# Patient Record
Sex: Male | Born: 1935 | Race: White | Smoking: Never smoker
Health system: Northeastern US, Academic
[De-identification: ages and names within clinical notes are randomized; demographics above are authoritative.]

## PROBLEM LIST (undated history)

## (undated) DIAGNOSIS — I447 Left bundle-branch block, unspecified: Secondary | ICD-10-CM

## (undated) DIAGNOSIS — I2510 Atherosclerotic heart disease of native coronary artery without angina pectoris: Secondary | ICD-10-CM

## (undated) DIAGNOSIS — R339 Retention of urine, unspecified: Secondary | ICD-10-CM

## (undated) DIAGNOSIS — I4891 Unspecified atrial fibrillation: Secondary | ICD-10-CM

## (undated) DIAGNOSIS — I4892 Unspecified atrial flutter: Secondary | ICD-10-CM

## (undated) DIAGNOSIS — I10 Essential (primary) hypertension: Secondary | ICD-10-CM

## (undated) DIAGNOSIS — M1990 Unspecified osteoarthritis, unspecified site: Secondary | ICD-10-CM

## (undated) DIAGNOSIS — E785 Hyperlipidemia, unspecified: Secondary | ICD-10-CM

## (undated) DIAGNOSIS — I509 Heart failure, unspecified: Secondary | ICD-10-CM

## (undated) DIAGNOSIS — R001 Bradycardia, unspecified: Secondary | ICD-10-CM

## (undated) HISTORY — DX: Unspecified atrial fibrillation: I48.91

## (undated) HISTORY — DX: Heart failure, unspecified: I50.9

## (undated) HISTORY — PX: ORTHOPEDIC SURGERY: SHX850

## (undated) HISTORY — DX: Left bundle-branch block, unspecified: I44.7

## (undated) HISTORY — DX: Hyperlipidemia, unspecified: E78.5

## (undated) HISTORY — PX: APPENDECTOMY: SHX54

## (undated) HISTORY — DX: Unspecified atrial flutter: I48.92

## (undated) HISTORY — DX: Retention of urine, unspecified: R33.9

## (undated) HISTORY — DX: Essential (primary) hypertension: I10

## (undated) HISTORY — PX: CATARACT REMOVAL WITH IMPLANT: SHX586

## (undated) HISTORY — DX: Unspecified osteoarthritis, unspecified site: M19.90

## (undated) HISTORY — DX: Atherosclerotic heart disease of native coronary artery without angina pectoris: I25.10

## (undated) HISTORY — PX: CORONARY ARTERY BYPASS GRAFT: SHX141

---

## 1999-11-15 ENCOUNTER — Encounter: Payer: Self-pay | Admitting: Emergency Medicine

## 1999-11-15 ENCOUNTER — Inpatient Hospital Stay (HOSPITAL_COMMUNITY): Admission: EM | Admit: 1999-11-15 | Discharge: 1999-11-24 | Payer: Self-pay | Admitting: Emergency Medicine

## 1999-11-15 ENCOUNTER — Encounter: Payer: Self-pay | Admitting: Orthopedic Surgery

## 1999-11-16 ENCOUNTER — Encounter: Payer: Self-pay | Admitting: Orthopedic Surgery

## 1999-11-18 ENCOUNTER — Encounter: Payer: Self-pay | Admitting: Orthopedic Surgery

## 1999-11-19 ENCOUNTER — Encounter: Payer: Self-pay | Admitting: Orthopedic Surgery

## 1999-11-20 ENCOUNTER — Encounter: Payer: Self-pay | Admitting: Orthopedic Surgery

## 1999-11-21 ENCOUNTER — Encounter: Payer: Self-pay | Admitting: Orthopedic Surgery

## 1999-11-22 ENCOUNTER — Encounter: Payer: Self-pay | Admitting: Orthopedic Surgery

## 1999-11-24 ENCOUNTER — Inpatient Hospital Stay (HOSPITAL_COMMUNITY)
Admission: EM | Admit: 1999-11-24 | Discharge: 1999-12-04 | Payer: Self-pay | Admitting: Physical Medicine & Rehabilitation

## 2003-02-09 ENCOUNTER — Inpatient Hospital Stay (HOSPITAL_COMMUNITY): Admission: AD | Admit: 2003-02-09 | Discharge: 2003-02-20 | Payer: Self-pay | Admitting: Emergency Medicine

## 2003-02-13 HISTORY — PX: CORONARY ARTERY BYPASS GRAFT: SHX141

## 2003-03-11 ENCOUNTER — Encounter: Admission: RE | Admit: 2003-03-11 | Discharge: 2003-03-11 | Payer: Self-pay | Admitting: Cardiothoracic Surgery

## 2003-04-11 ENCOUNTER — Encounter (HOSPITAL_COMMUNITY): Admission: RE | Admit: 2003-04-11 | Discharge: 2003-07-10 | Payer: Self-pay | Admitting: Cardiology

## 2003-07-11 ENCOUNTER — Encounter (HOSPITAL_COMMUNITY): Admission: RE | Admit: 2003-07-11 | Discharge: 2003-10-09 | Payer: Self-pay | Admitting: Cardiology

## 2004-06-29 IMAGING — CR DG CHEST 2V
2 series · 2 of 2 positions shown · non-contrast
Comparison: none

CLINICAL DATA: Coronary artery disease, CABG three weeks ago; follow up.
 CHEST X-RAY
 Two views of the chest are compared to a CT of the chest from [HOSPITAL] dated 02/11/03.  There is a small left pleural effusion and left basilar atelectasis status post recent CABG.  A nodular opacity overlying the left anterior first costochondral junction is degenerative in origin when compared to that prior CT.  Median sternotomy sutures are noted and cardiomegaly is unchanged.
 IMPRESSION
 Small left effusion and left basilar atelectasis status post recent CABG.

[view not recorded (1 of 2)]
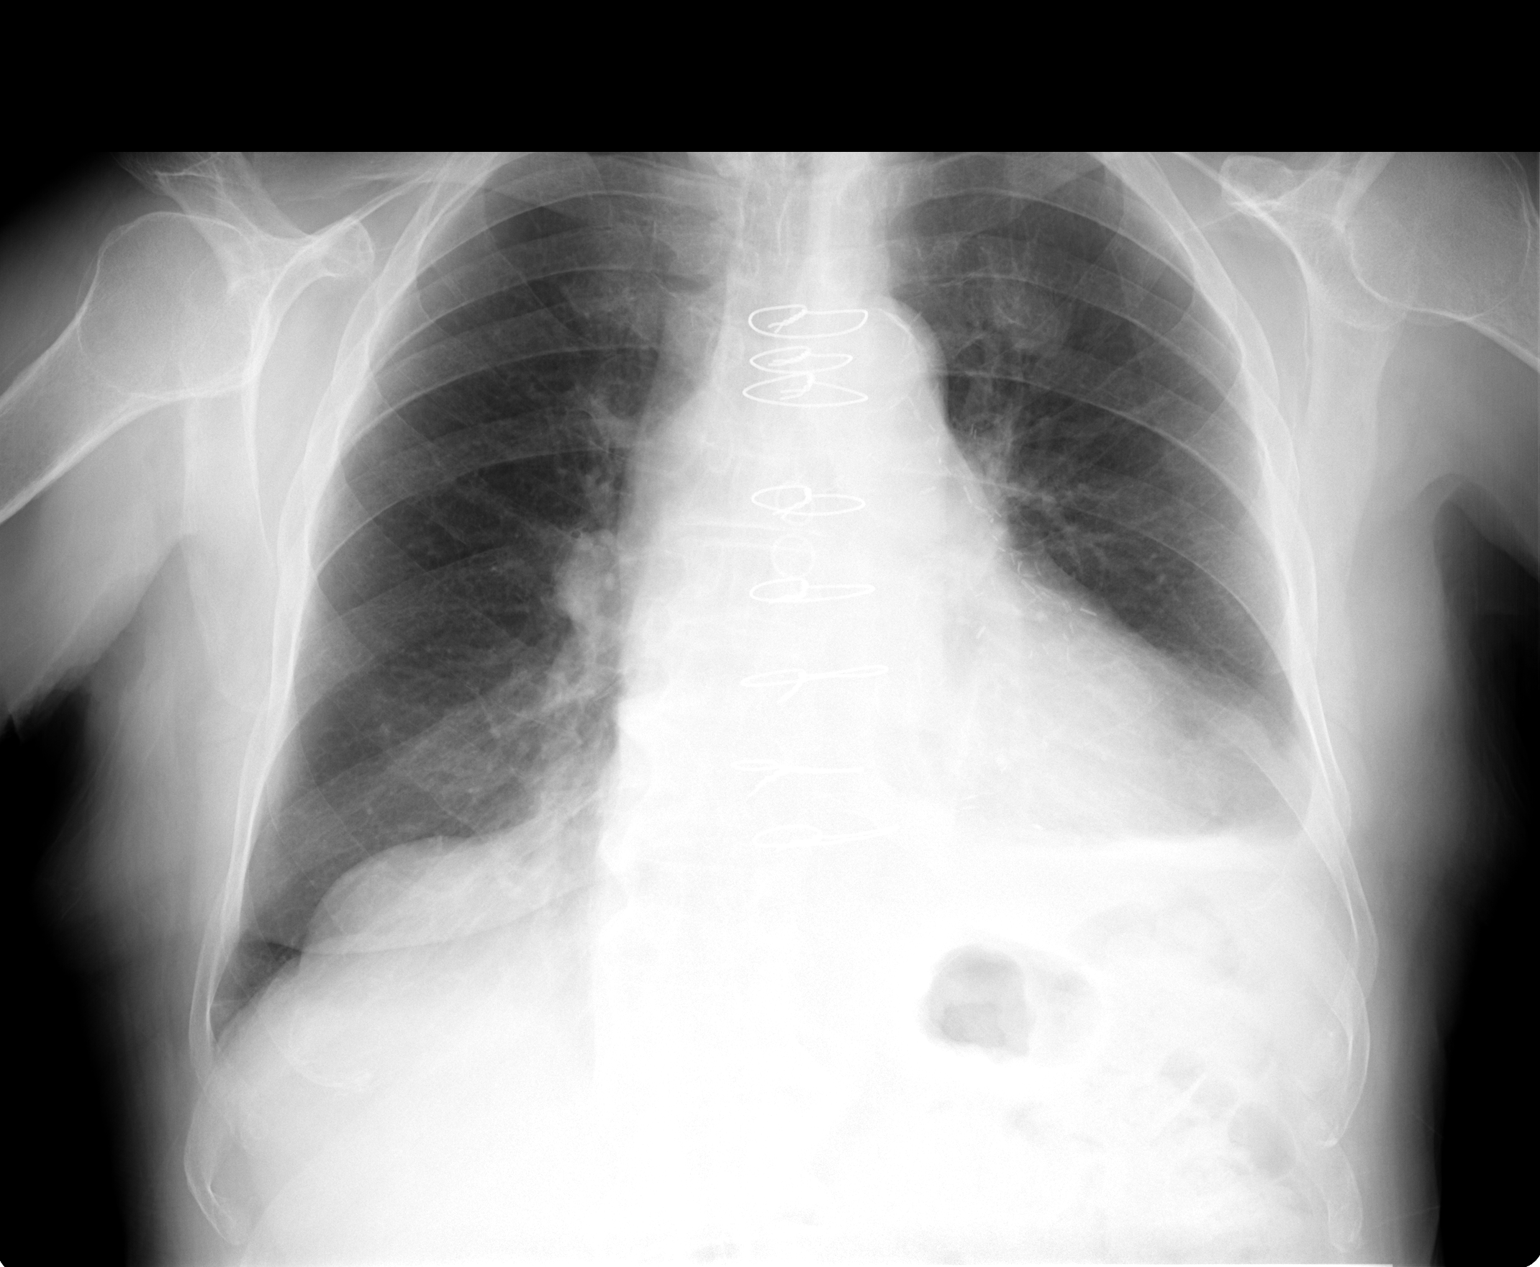

[view not recorded (2 of 2)]
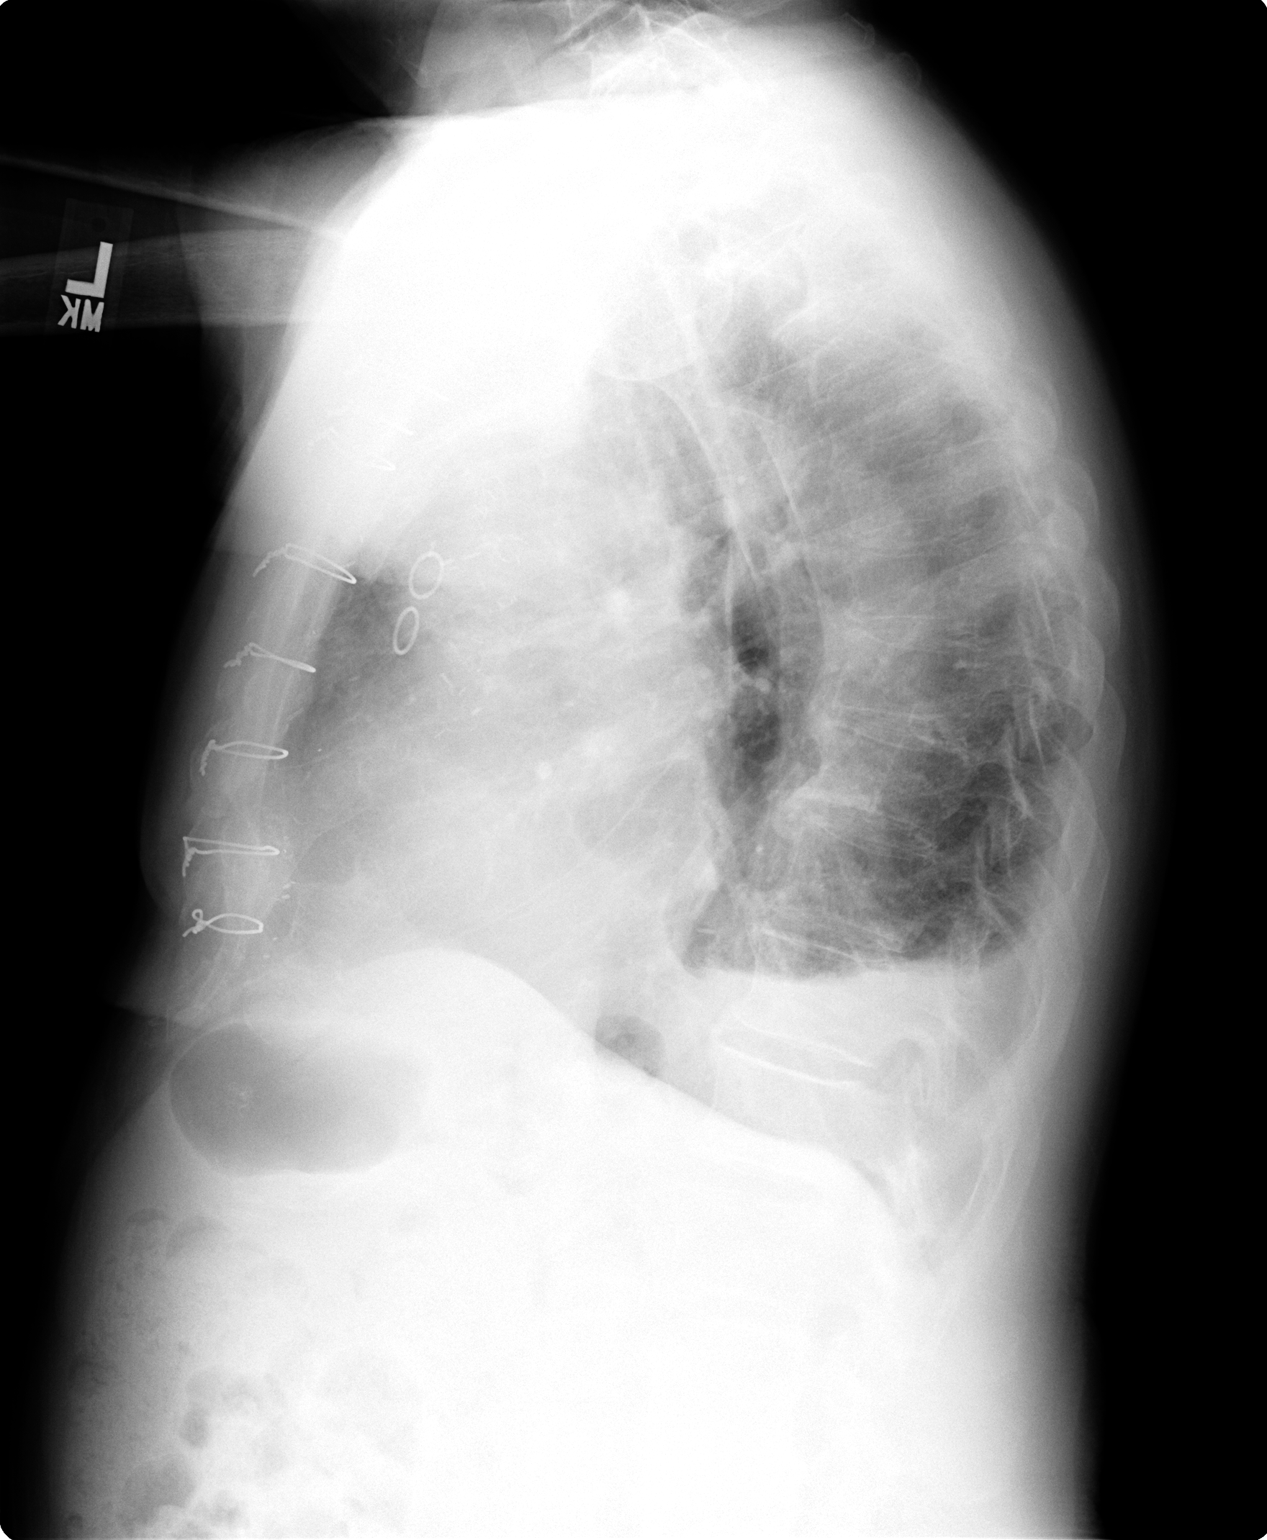

[2 of 2 positions shown; findings below may reference images not displayed]

## 2004-11-22 ENCOUNTER — Inpatient Hospital Stay (HOSPITAL_COMMUNITY): Admission: EM | Admit: 2004-11-22 | Discharge: 2004-12-01 | Payer: Self-pay | Admitting: Emergency Medicine

## 2004-11-22 ENCOUNTER — Ambulatory Visit: Payer: Self-pay | Admitting: Physical Medicine & Rehabilitation

## 2004-12-01 ENCOUNTER — Inpatient Hospital Stay
Admission: RE | Admit: 2004-12-01 | Discharge: 2004-12-08 | Payer: Self-pay | Admitting: Physical Medicine & Rehabilitation

## 2005-03-05 ENCOUNTER — Inpatient Hospital Stay (HOSPITAL_COMMUNITY): Admission: RE | Admit: 2005-03-05 | Discharge: 2005-03-08 | Payer: Self-pay | Admitting: Orthopedic Surgery

## 2011-05-14 DIAGNOSIS — I10 Essential (primary) hypertension: Secondary | ICD-10-CM | POA: Diagnosis not present

## 2011-05-14 DIAGNOSIS — E7800 Pure hypercholesterolemia, unspecified: Secondary | ICD-10-CM | POA: Diagnosis not present

## 2011-05-27 DIAGNOSIS — I4891 Unspecified atrial fibrillation: Secondary | ICD-10-CM | POA: Diagnosis not present

## 2011-05-27 DIAGNOSIS — I2510 Atherosclerotic heart disease of native coronary artery without angina pectoris: Secondary | ICD-10-CM | POA: Diagnosis not present

## 2011-05-27 DIAGNOSIS — E785 Hyperlipidemia, unspecified: Secondary | ICD-10-CM | POA: Diagnosis not present

## 2011-05-27 DIAGNOSIS — I10 Essential (primary) hypertension: Secondary | ICD-10-CM | POA: Diagnosis not present

## 2011-05-27 DIAGNOSIS — I252 Old myocardial infarction: Secondary | ICD-10-CM | POA: Diagnosis not present

## 2011-09-18 DIAGNOSIS — I252 Old myocardial infarction: Secondary | ICD-10-CM | POA: Diagnosis not present

## 2011-09-18 DIAGNOSIS — I10 Essential (primary) hypertension: Secondary | ICD-10-CM | POA: Diagnosis not present

## 2011-09-18 DIAGNOSIS — R61 Generalized hyperhidrosis: Secondary | ICD-10-CM | POA: Diagnosis not present

## 2011-09-18 DIAGNOSIS — E785 Hyperlipidemia, unspecified: Secondary | ICD-10-CM | POA: Diagnosis not present

## 2011-09-18 DIAGNOSIS — I2510 Atherosclerotic heart disease of native coronary artery without angina pectoris: Secondary | ICD-10-CM | POA: Diagnosis not present

## 2011-09-18 DIAGNOSIS — I4891 Unspecified atrial fibrillation: Secondary | ICD-10-CM | POA: Diagnosis not present

## 2011-11-12 DIAGNOSIS — I10 Essential (primary) hypertension: Secondary | ICD-10-CM | POA: Diagnosis not present

## 2011-11-12 DIAGNOSIS — Z23 Encounter for immunization: Secondary | ICD-10-CM | POA: Diagnosis not present

## 2011-11-12 DIAGNOSIS — M129 Arthropathy, unspecified: Secondary | ICD-10-CM | POA: Diagnosis not present

## 2011-11-12 DIAGNOSIS — E7800 Pure hypercholesterolemia, unspecified: Secondary | ICD-10-CM | POA: Diagnosis not present

## 2011-12-02 DIAGNOSIS — I4891 Unspecified atrial fibrillation: Secondary | ICD-10-CM | POA: Diagnosis not present

## 2011-12-02 DIAGNOSIS — E785 Hyperlipidemia, unspecified: Secondary | ICD-10-CM | POA: Diagnosis not present

## 2011-12-02 DIAGNOSIS — I252 Old myocardial infarction: Secondary | ICD-10-CM | POA: Diagnosis not present

## 2011-12-02 DIAGNOSIS — R61 Generalized hyperhidrosis: Secondary | ICD-10-CM | POA: Diagnosis not present

## 2011-12-02 DIAGNOSIS — I10 Essential (primary) hypertension: Secondary | ICD-10-CM | POA: Diagnosis not present

## 2011-12-02 DIAGNOSIS — I2510 Atherosclerotic heart disease of native coronary artery without angina pectoris: Secondary | ICD-10-CM | POA: Diagnosis not present

## 2012-03-16 DIAGNOSIS — M19049 Primary osteoarthritis, unspecified hand: Secondary | ICD-10-CM | POA: Diagnosis not present

## 2012-05-19 DIAGNOSIS — I10 Essential (primary) hypertension: Secondary | ICD-10-CM | POA: Diagnosis not present

## 2012-05-19 DIAGNOSIS — Z0000 Encounter for general adult medical examination without abnormal findings: Secondary | ICD-10-CM | POA: Diagnosis not present

## 2012-05-19 DIAGNOSIS — H6190 Disorder of external ear, unspecified, unspecified ear: Secondary | ICD-10-CM | POA: Diagnosis not present

## 2012-05-19 DIAGNOSIS — Z23 Encounter for immunization: Secondary | ICD-10-CM | POA: Diagnosis not present

## 2012-05-19 DIAGNOSIS — Z1331 Encounter for screening for depression: Secondary | ICD-10-CM | POA: Diagnosis not present

## 2012-05-19 DIAGNOSIS — E7800 Pure hypercholesterolemia, unspecified: Secondary | ICD-10-CM | POA: Diagnosis not present

## 2012-06-01 DIAGNOSIS — I252 Old myocardial infarction: Secondary | ICD-10-CM | POA: Diagnosis not present

## 2012-06-01 DIAGNOSIS — I10 Essential (primary) hypertension: Secondary | ICD-10-CM | POA: Diagnosis not present

## 2012-06-01 DIAGNOSIS — I4891 Unspecified atrial fibrillation: Secondary | ICD-10-CM | POA: Diagnosis not present

## 2012-06-01 DIAGNOSIS — E785 Hyperlipidemia, unspecified: Secondary | ICD-10-CM | POA: Diagnosis not present

## 2012-06-01 DIAGNOSIS — I2510 Atherosclerotic heart disease of native coronary artery without angina pectoris: Secondary | ICD-10-CM | POA: Diagnosis not present

## 2012-06-02 DIAGNOSIS — H3530 Unspecified macular degeneration: Secondary | ICD-10-CM | POA: Diagnosis not present

## 2012-08-31 DIAGNOSIS — H00019 Hordeolum externum unspecified eye, unspecified eyelid: Secondary | ICD-10-CM | POA: Diagnosis not present

## 2012-11-30 DIAGNOSIS — L989 Disorder of the skin and subcutaneous tissue, unspecified: Secondary | ICD-10-CM | POA: Diagnosis not present

## 2012-11-30 DIAGNOSIS — I259 Chronic ischemic heart disease, unspecified: Secondary | ICD-10-CM | POA: Diagnosis not present

## 2012-11-30 DIAGNOSIS — I252 Old myocardial infarction: Secondary | ICD-10-CM | POA: Diagnosis not present

## 2012-11-30 DIAGNOSIS — I10 Essential (primary) hypertension: Secondary | ICD-10-CM | POA: Diagnosis not present

## 2012-11-30 DIAGNOSIS — I2510 Atherosclerotic heart disease of native coronary artery without angina pectoris: Secondary | ICD-10-CM | POA: Diagnosis not present

## 2012-11-30 DIAGNOSIS — E785 Hyperlipidemia, unspecified: Secondary | ICD-10-CM | POA: Diagnosis not present

## 2012-11-30 DIAGNOSIS — I4891 Unspecified atrial fibrillation: Secondary | ICD-10-CM | POA: Diagnosis not present

## 2012-11-30 DIAGNOSIS — M129 Arthropathy, unspecified: Secondary | ICD-10-CM | POA: Diagnosis not present

## 2012-11-30 DIAGNOSIS — H3530 Unspecified macular degeneration: Secondary | ICD-10-CM | POA: Diagnosis not present

## 2012-11-30 DIAGNOSIS — E7800 Pure hypercholesterolemia, unspecified: Secondary | ICD-10-CM | POA: Diagnosis not present

## 2012-11-30 DIAGNOSIS — R609 Edema, unspecified: Secondary | ICD-10-CM | POA: Diagnosis not present

## 2012-12-15 DIAGNOSIS — Z23 Encounter for immunization: Secondary | ICD-10-CM | POA: Diagnosis not present

## 2013-02-23 ENCOUNTER — Ambulatory Visit
Admission: RE | Admit: 2013-02-23 | Discharge: 2013-02-23 | Disposition: A | Discharge status: Home / Self Care | Payer: Medicare Other | Source: Ambulatory Visit | Attending: Cardiology | Admitting: Cardiology

## 2013-02-23 ENCOUNTER — Other Ambulatory Visit: Payer: Self-pay | Admitting: Cardiology

## 2013-02-23 DIAGNOSIS — I4891 Unspecified atrial fibrillation: Secondary | ICD-10-CM | POA: Diagnosis not present

## 2013-02-23 DIAGNOSIS — R609 Edema, unspecified: Secondary | ICD-10-CM | POA: Diagnosis not present

## 2013-02-23 DIAGNOSIS — R0602 Shortness of breath: Secondary | ICD-10-CM

## 2013-02-23 DIAGNOSIS — I252 Old myocardial infarction: Secondary | ICD-10-CM | POA: Diagnosis not present

## 2013-02-23 DIAGNOSIS — I10 Essential (primary) hypertension: Secondary | ICD-10-CM | POA: Diagnosis not present

## 2013-02-23 DIAGNOSIS — E785 Hyperlipidemia, unspecified: Secondary | ICD-10-CM | POA: Diagnosis not present

## 2013-02-23 DIAGNOSIS — I2510 Atherosclerotic heart disease of native coronary artery without angina pectoris: Secondary | ICD-10-CM | POA: Diagnosis not present

## 2013-02-23 DIAGNOSIS — J449 Chronic obstructive pulmonary disease, unspecified: Secondary | ICD-10-CM | POA: Diagnosis not present

## 2013-03-10 ENCOUNTER — Other Ambulatory Visit: Payer: Self-pay | Admitting: Gastroenterology

## 2013-03-10 DIAGNOSIS — I10 Essential (primary) hypertension: Secondary | ICD-10-CM | POA: Diagnosis not present

## 2013-03-10 DIAGNOSIS — R0602 Shortness of breath: Secondary | ICD-10-CM | POA: Diagnosis not present

## 2013-03-10 DIAGNOSIS — I252 Old myocardial infarction: Secondary | ICD-10-CM | POA: Diagnosis not present

## 2013-03-10 DIAGNOSIS — I4891 Unspecified atrial fibrillation: Secondary | ICD-10-CM | POA: Diagnosis not present

## 2013-03-10 DIAGNOSIS — I2589 Other forms of chronic ischemic heart disease: Secondary | ICD-10-CM | POA: Diagnosis not present

## 2013-03-10 DIAGNOSIS — I447 Left bundle-branch block, unspecified: Secondary | ICD-10-CM | POA: Diagnosis not present

## 2013-03-10 DIAGNOSIS — I5022 Chronic systolic (congestive) heart failure: Secondary | ICD-10-CM | POA: Diagnosis not present

## 2013-03-10 DIAGNOSIS — E785 Hyperlipidemia, unspecified: Secondary | ICD-10-CM | POA: Diagnosis not present

## 2013-06-01 DIAGNOSIS — I2589 Other forms of chronic ischemic heart disease: Secondary | ICD-10-CM | POA: Diagnosis not present

## 2013-06-01 DIAGNOSIS — I447 Left bundle-branch block, unspecified: Secondary | ICD-10-CM | POA: Diagnosis not present

## 2013-06-01 DIAGNOSIS — R0602 Shortness of breath: Secondary | ICD-10-CM | POA: Diagnosis not present

## 2013-06-01 DIAGNOSIS — I10 Essential (primary) hypertension: Secondary | ICD-10-CM | POA: Diagnosis not present

## 2013-06-01 DIAGNOSIS — E785 Hyperlipidemia, unspecified: Secondary | ICD-10-CM | POA: Diagnosis not present

## 2013-06-01 DIAGNOSIS — I252 Old myocardial infarction: Secondary | ICD-10-CM | POA: Diagnosis not present

## 2013-06-01 DIAGNOSIS — I5022 Chronic systolic (congestive) heart failure: Secondary | ICD-10-CM | POA: Diagnosis not present

## 2013-06-01 DIAGNOSIS — I4891 Unspecified atrial fibrillation: Secondary | ICD-10-CM | POA: Diagnosis not present

## 2013-06-08 DIAGNOSIS — Z23 Encounter for immunization: Secondary | ICD-10-CM | POA: Diagnosis not present

## 2013-06-08 DIAGNOSIS — Z0000 Encounter for general adult medical examination without abnormal findings: Secondary | ICD-10-CM | POA: Diagnosis not present

## 2013-06-08 DIAGNOSIS — E7800 Pure hypercholesterolemia, unspecified: Secondary | ICD-10-CM | POA: Diagnosis not present

## 2013-06-08 DIAGNOSIS — Z1331 Encounter for screening for depression: Secondary | ICD-10-CM | POA: Diagnosis not present

## 2013-06-08 DIAGNOSIS — I10 Essential (primary) hypertension: Secondary | ICD-10-CM | POA: Diagnosis not present

## 2013-06-21 ENCOUNTER — Other Ambulatory Visit: Payer: Self-pay | Admitting: Family Medicine

## 2013-06-21 ENCOUNTER — Ambulatory Visit
Admission: RE | Admit: 2013-06-21 | Discharge: 2013-06-21 | Disposition: A | Discharge status: Home / Self Care | Payer: Medicare Other | Source: Ambulatory Visit | Attending: Family Medicine | Admitting: Family Medicine

## 2013-06-21 DIAGNOSIS — S2249XA Multiple fractures of ribs, unspecified side, initial encounter for closed fracture: Secondary | ICD-10-CM | POA: Diagnosis not present

## 2013-06-21 DIAGNOSIS — R071 Chest pain on breathing: Secondary | ICD-10-CM

## 2013-12-07 DIAGNOSIS — R600 Localized edema: Secondary | ICD-10-CM | POA: Diagnosis not present

## 2013-12-07 DIAGNOSIS — E780 Pure hypercholesterolemia: Secondary | ICD-10-CM | POA: Diagnosis not present

## 2013-12-07 DIAGNOSIS — M129 Arthropathy, unspecified: Secondary | ICD-10-CM | POA: Diagnosis not present

## 2013-12-07 DIAGNOSIS — I2510 Atherosclerotic heart disease of native coronary artery without angina pectoris: Secondary | ICD-10-CM | POA: Diagnosis not present

## 2013-12-07 DIAGNOSIS — I119 Hypertensive heart disease without heart failure: Secondary | ICD-10-CM | POA: Diagnosis not present

## 2013-12-07 DIAGNOSIS — Z23 Encounter for immunization: Secondary | ICD-10-CM | POA: Diagnosis not present

## 2014-02-24 DIAGNOSIS — R109 Unspecified abdominal pain: Secondary | ICD-10-CM | POA: Diagnosis not present

## 2014-04-08 ENCOUNTER — Encounter: Payer: Self-pay | Admitting: Gastroenterology

## 2014-04-08 DIAGNOSIS — I10 Essential (primary) hypertension: Secondary | ICD-10-CM | POA: Diagnosis not present

## 2014-04-08 DIAGNOSIS — I5022 Chronic systolic (congestive) heart failure: Secondary | ICD-10-CM | POA: Diagnosis not present

## 2014-04-08 DIAGNOSIS — I483 Typical atrial flutter: Secondary | ICD-10-CM | POA: Diagnosis not present

## 2014-04-08 DIAGNOSIS — I482 Chronic atrial fibrillation: Secondary | ICD-10-CM | POA: Diagnosis not present

## 2014-04-08 DIAGNOSIS — E785 Hyperlipidemia, unspecified: Secondary | ICD-10-CM | POA: Diagnosis not present

## 2014-04-08 DIAGNOSIS — I255 Ischemic cardiomyopathy: Secondary | ICD-10-CM | POA: Diagnosis not present

## 2014-04-08 DIAGNOSIS — I447 Left bundle-branch block, unspecified: Secondary | ICD-10-CM | POA: Diagnosis not present

## 2014-04-08 DIAGNOSIS — I252 Old myocardial infarction: Secondary | ICD-10-CM | POA: Diagnosis not present

## 2014-07-22 DIAGNOSIS — Z23 Encounter for immunization: Secondary | ICD-10-CM | POA: Diagnosis not present

## 2014-07-22 DIAGNOSIS — E780 Pure hypercholesterolemia: Secondary | ICD-10-CM | POA: Diagnosis not present

## 2014-07-22 DIAGNOSIS — Z0000 Encounter for general adult medical examination without abnormal findings: Secondary | ICD-10-CM | POA: Diagnosis not present

## 2014-07-22 DIAGNOSIS — Z1389 Encounter for screening for other disorder: Secondary | ICD-10-CM | POA: Diagnosis not present

## 2014-07-22 DIAGNOSIS — I119 Hypertensive heart disease without heart failure: Secondary | ICD-10-CM | POA: Diagnosis not present

## 2014-07-22 DIAGNOSIS — M129 Arthropathy, unspecified: Secondary | ICD-10-CM | POA: Diagnosis not present

## 2014-12-02 DIAGNOSIS — Z23 Encounter for immunization: Secondary | ICD-10-CM | POA: Diagnosis not present

## 2014-12-22 DIAGNOSIS — J00 Acute nasopharyngitis [common cold]: Secondary | ICD-10-CM | POA: Diagnosis not present

## 2015-01-06 DIAGNOSIS — J189 Pneumonia, unspecified organism: Secondary | ICD-10-CM | POA: Diagnosis not present

## 2015-01-06 DIAGNOSIS — R918 Other nonspecific abnormal finding of lung field: Secondary | ICD-10-CM | POA: Diagnosis not present

## 2015-01-12 DIAGNOSIS — I25810 Atherosclerosis of coronary artery bypass graft(s) without angina pectoris: Secondary | ICD-10-CM | POA: Diagnosis not present

## 2015-01-12 DIAGNOSIS — I482 Chronic atrial fibrillation: Secondary | ICD-10-CM | POA: Diagnosis not present

## 2015-01-12 DIAGNOSIS — J189 Pneumonia, unspecified organism: Secondary | ICD-10-CM | POA: Diagnosis not present

## 2015-01-16 ENCOUNTER — Other Ambulatory Visit: Payer: Self-pay | Admitting: Gastroenterology

## 2015-01-16 DIAGNOSIS — J439 Emphysema, unspecified: Secondary | ICD-10-CM | POA: Diagnosis not present

## 2015-01-16 DIAGNOSIS — I517 Cardiomegaly: Secondary | ICD-10-CM | POA: Diagnosis not present

## 2015-01-24 ENCOUNTER — Encounter: Payer: Self-pay | Admitting: Cardiology

## 2015-01-24 ENCOUNTER — Other Ambulatory Visit: Payer: Self-pay | Admitting: Gastroenterology

## 2015-01-24 ENCOUNTER — Ambulatory Visit: Payer: Self-pay | Admitting: Cardiology

## 2015-01-24 DIAGNOSIS — I4819 Other persistent atrial fibrillation: Secondary | ICD-10-CM

## 2015-01-24 DIAGNOSIS — I2510 Atherosclerotic heart disease of native coronary artery without angina pectoris: Secondary | ICD-10-CM | POA: Insufficient documentation

## 2015-01-24 DIAGNOSIS — I10 Essential (primary) hypertension: Secondary | ICD-10-CM

## 2015-01-24 DIAGNOSIS — I5022 Chronic systolic (congestive) heart failure: Secondary | ICD-10-CM

## 2015-01-24 DIAGNOSIS — I483 Typical atrial flutter: Secondary | ICD-10-CM

## 2015-01-24 DIAGNOSIS — I4891 Unspecified atrial fibrillation: Secondary | ICD-10-CM | POA: Insufficient documentation

## 2015-01-24 DIAGNOSIS — I447 Left bundle-branch block, unspecified: Secondary | ICD-10-CM

## 2015-01-24 DIAGNOSIS — E785 Hyperlipidemia, unspecified: Secondary | ICD-10-CM | POA: Insufficient documentation

## 2015-01-24 DIAGNOSIS — I4892 Unspecified atrial flutter: Secondary | ICD-10-CM | POA: Insufficient documentation

## 2015-01-24 DIAGNOSIS — M1990 Unspecified osteoarthritis, unspecified site: Secondary | ICD-10-CM | POA: Insufficient documentation

## 2015-01-24 DIAGNOSIS — I509 Heart failure, unspecified: Secondary | ICD-10-CM | POA: Insufficient documentation

## 2015-01-24 DIAGNOSIS — I481 Persistent atrial fibrillation: Secondary | ICD-10-CM | POA: Diagnosis not present
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
## 2015-01-25 ENCOUNTER — Other Ambulatory Visit: Payer: Self-pay | Admitting: Cardiology

## 2015-01-25 DIAGNOSIS — I4891 Unspecified atrial fibrillation: Secondary | ICD-10-CM

## 2015-01-25 LAB — EKG 12-LEAD
QRS: 25 degrees
Rate: 65 {beats}/min
Severity: ABNORMAL
Severity: ABNORMAL
T: 181 degrees

## 2015-01-25 MED ORDER — APIXABAN 2.5 MG PO TABS *I*
2.5000 mg | ORAL_TABLET | Freq: Two times a day (BID) | ORAL | 1 refills | Status: DC
Start: 2015-01-25 — End: 2015-03-02

## 2015-01-25 NOTE — Telephone Encounter (Signed)
Patient would like 90 day refill for Eliquis sent to [facility] on [address].

## 2015-02-12 HISTORY — PX: OTHER SURGICAL HISTORY: SHX169

## 2015-02-25 ENCOUNTER — Inpatient Hospital Stay
Admission: EM | Admit: 2015-02-25 | Disposition: A | Discharge status: Skilled Nursing Facility | Payer: Self-pay | Source: Ambulatory Visit | Attending: Orthopedic Surgery | Admitting: Orthopedic Surgery

## 2015-02-25 ENCOUNTER — Encounter: Payer: Self-pay | Admitting: Emergency Medicine

## 2015-02-25 ENCOUNTER — Other Ambulatory Visit: Payer: Self-pay | Admitting: Gastroenterology

## 2015-02-25 DIAGNOSIS — S7221XA Displaced subtrochanteric fracture of right femur, initial encounter for closed fracture: Principal | ICD-10-CM

## 2015-02-25 DIAGNOSIS — I4891 Unspecified atrial fibrillation: Secondary | ICD-10-CM | POA: Diagnosis present

## 2015-02-25 DIAGNOSIS — I509 Heart failure, unspecified: Secondary | ICD-10-CM | POA: Diagnosis present

## 2015-02-25 DIAGNOSIS — I2510 Atherosclerotic heart disease of native coronary artery without angina pectoris: Secondary | ICD-10-CM | POA: Diagnosis present

## 2015-02-25 DIAGNOSIS — I10 Essential (primary) hypertension: Secondary | ICD-10-CM | POA: Diagnosis present

## 2015-02-25 DIAGNOSIS — S72001A Fracture of unspecified part of neck of right femur, initial encounter for closed fracture: Secondary | ICD-10-CM | POA: Diagnosis not present

## 2015-02-25 DIAGNOSIS — I447 Left bundle-branch block, unspecified: Secondary | ICD-10-CM | POA: Diagnosis not present

## 2015-02-25 DIAGNOSIS — D62 Acute posthemorrhagic anemia: Secondary | ICD-10-CM | POA: Diagnosis not present

## 2015-02-25 DIAGNOSIS — M79651 Pain in right thigh: Secondary | ICD-10-CM | POA: Diagnosis not present

## 2015-02-25 DIAGNOSIS — M79601 Pain in right arm: Secondary | ICD-10-CM | POA: Diagnosis not present

## 2015-02-25 DIAGNOSIS — Z5189 Encounter for other specified aftercare: Secondary | ICD-10-CM | POA: Diagnosis not present

## 2015-02-25 DIAGNOSIS — M160 Bilateral primary osteoarthritis of hip: Secondary | ICD-10-CM | POA: Diagnosis not present

## 2015-02-25 DIAGNOSIS — M25552 Pain in left hip: Secondary | ICD-10-CM | POA: Diagnosis not present

## 2015-02-25 DIAGNOSIS — W19XXXD Unspecified fall, subsequent encounter: Secondary | ICD-10-CM | POA: Diagnosis not present

## 2015-02-25 DIAGNOSIS — S8992XA Unspecified injury of left lower leg, initial encounter: Secondary | ICD-10-CM | POA: Diagnosis not present

## 2015-02-25 DIAGNOSIS — R339 Retention of urine, unspecified: Secondary | ICD-10-CM | POA: Diagnosis not present

## 2015-02-25 DIAGNOSIS — I25810 Atherosclerosis of coronary artery bypass graft(s) without angina pectoris: Secondary | ICD-10-CM | POA: Diagnosis not present

## 2015-02-25 DIAGNOSIS — R2681 Unsteadiness on feet: Secondary | ICD-10-CM | POA: Diagnosis not present

## 2015-02-25 DIAGNOSIS — G8918 Other acute postprocedural pain: Secondary | ICD-10-CM | POA: Diagnosis not present

## 2015-02-25 DIAGNOSIS — Z9181 History of falling: Secondary | ICD-10-CM | POA: Diagnosis not present

## 2015-02-25 DIAGNOSIS — R296 Repeated falls: Secondary | ICD-10-CM | POA: Diagnosis not present

## 2015-02-25 DIAGNOSIS — I482 Chronic atrial fibrillation: Secondary | ICD-10-CM | POA: Diagnosis not present

## 2015-02-25 DIAGNOSIS — Z951 Presence of aortocoronary bypass graft: Secondary | ICD-10-CM | POA: Diagnosis not present

## 2015-02-25 DIAGNOSIS — I5032 Chronic diastolic (congestive) heart failure: Secondary | ICD-10-CM | POA: Diagnosis not present

## 2015-02-25 DIAGNOSIS — S7221XD Displaced subtrochanteric fracture of right femur, subsequent encounter for closed fracture with routine healing: Secondary | ICD-10-CM | POA: Diagnosis not present

## 2015-02-25 DIAGNOSIS — W1831XA Fall on same level due to stepping on an object, initial encounter: Secondary | ICD-10-CM | POA: Diagnosis not present

## 2015-02-25 DIAGNOSIS — M25551 Pain in right hip: Secondary | ICD-10-CM | POA: Diagnosis not present

## 2015-02-25 DIAGNOSIS — S72101A Unspecified trochanteric fracture of right femur, initial encounter for closed fracture: Secondary | ICD-10-CM | POA: Diagnosis not present

## 2015-02-25 DIAGNOSIS — S72141A Displaced intertrochanteric fracture of right femur, initial encounter for closed fracture: Secondary | ICD-10-CM | POA: Diagnosis not present

## 2015-02-25 DIAGNOSIS — R9431 Abnormal electrocardiogram [ECG] [EKG]: Secondary | ICD-10-CM | POA: Diagnosis not present

## 2015-02-25 LAB — CBC AND DIFFERENTIAL
Baso # K/uL: 0.1 10*3/uL (ref 0.0–0.1)
Basophil %: 0.8 %
Eos # K/uL: 0.1 10*3/uL (ref 0.0–0.5)
Eosinophil %: 1.6 %
Hematocrit: 40 % (ref 40–51)
Hemoglobin: 12.7 g/dL — ABNORMAL LOW (ref 13.7–17.5)
IMM Granulocytes #: 0 10*3/uL (ref 0.0–0.1)
IMM Granulocytes: 0.4 %
Lymph # K/uL: 1.6 10*3/uL (ref 1.3–3.6)
Lymphocyte %: 21.1 %
MCH: 31 pg (ref 26–32)
MCHC: 32 g/dL (ref 32–37)
MCV: 97 fL — ABNORMAL HIGH (ref 79–92)
Mono # K/uL: 0.8 10*3/uL (ref 0.3–0.8)
Monocyte %: 9.9 %
Neut # K/uL: 5 10*3/uL (ref 1.8–5.4)
Nucl RBC # K/uL: 0 10*3/uL (ref 0.0–0.0)
Nucl RBC %: 0 /100 WBC (ref 0.0–0.2)
Platelets: 273 10*3/uL (ref 150–330)
RBC: 4.1 MIL/uL — ABNORMAL LOW (ref 4.6–6.1)
RDW: 14.1 % (ref 11.6–14.4)
Seg Neut %: 66.2 %
WBC: 7.6 10*3/uL (ref 4.2–9.1)

## 2015-02-25 LAB — MCHC: MCHC: 32 g/dL (ref 32–37)

## 2015-02-25 LAB — BASIC METABOLIC PANEL
Anion Gap: 13 (ref 7–16)
CO2: 25 mmol/L (ref 20–28)
Calcium: 8.9 mg/dL (ref 8.6–10.2)
Chloride: 103 mmol/L (ref 96–108)
Creatinine: 0.84 mg/dL (ref 0.67–1.17)
GFR,Black: 83 *
GFR,Caucasian: 72 *
Glucose: 110 mg/dL — ABNORMAL HIGH (ref 60–99)
Lab: 18 mg/dL (ref 6–20)
Potassium: 4 mmol/L (ref 3.3–5.1)
Sodium: 141 mmol/L (ref 133–145)

## 2015-02-25 LAB — ALBUMIN: Albumin: 3.8 g/dL (ref 3.5–5.2)

## 2015-02-25 LAB — HOLD BLUE

## 2015-02-25 LAB — PTH, INTACT: Intact PTH: 61 pg/mL (ref 15.0–65.0)

## 2015-02-25 LAB — TYPE AND SCREEN
ABO RH Blood Type: O POS
Antibody Screen: NEGATIVE

## 2015-02-25 LAB — HCT AND HGB
Hematocrit: 34 % — ABNORMAL LOW (ref 40–51)
Hemoglobin: 11 g/dL — ABNORMAL LOW (ref 13.7–17.5)

## 2015-02-25 LAB — PROTIME-INR
INR: 1.4 — ABNORMAL HIGH (ref 0.9–1.1)
Protime: 16.5 s — ABNORMAL HIGH (ref 10.0–12.9)

## 2015-02-25 LAB — HOLD GREEN WITH GEL

## 2015-02-25 LAB — TSH: TSH: 2.26 u[IU]/mL (ref 0.27–4.20)

## 2015-02-25 MED ORDER — ACETAMINOPHEN 500 MG PO TABS *I*
1000.0000 mg | ORAL_TABLET | Freq: Three times a day (TID) | ORAL | Status: DC
Start: 2015-02-25 — End: 2015-02-26
  Administered 2015-02-25 – 2015-02-26 (×2): 1000 mg via ORAL
  Filled 2015-02-25 (×2): qty 2

## 2015-02-25 MED ORDER — OXYCODONE HCL 5 MG PO TABS *I*
5.0000 mg | ORAL_TABLET | ORAL | Status: DC | PRN
Start: 2015-02-25 — End: 2015-02-26

## 2015-02-25 MED ORDER — OXYCODONE HCL 5 MG PO TABS *I*
2.5000 mg | ORAL_TABLET | ORAL | Status: DC | PRN
Start: 2015-02-25 — End: 2015-02-26
  Administered 2015-02-25: 2.5 mg via ORAL
  Filled 2015-02-25: qty 1

## 2015-02-25 MED ORDER — SODIUM CHLORIDE 0.9 % IV SOLN WRAPPED *I*
100.0000 mL/h | Status: DC
Start: 2015-02-26 — End: 2015-02-26
  Administered 2015-02-26: 100 mL/h via INTRAVENOUS

## 2015-02-25 MED ORDER — HALOPERIDOL LACTATE 5 MG/ML IJ SOLN *I*
0.5000 mg | Freq: Four times a day (QID) | INTRAMUSCULAR | Status: DC | PRN
Start: 2015-02-25 — End: 2015-02-26

## 2015-02-25 MED ORDER — ONDANSETRON HCL 2 MG/ML IV SOLN *I*
4.0000 mg | Freq: Once | INTRAMUSCULAR | Status: AC
Start: 2015-02-25 — End: 2015-02-25
  Administered 2015-02-25: 4 mg via INTRAVENOUS
  Filled 2015-02-25: qty 2

## 2015-02-25 MED ORDER — SODIUM CHLORIDE 0.9 % IV BOLUS *I*
500.0000 mL | Freq: Once | Status: AC
Start: 2015-02-25 — End: 2015-02-25
  Administered 2015-02-25: 500 mL via INTRAVENOUS

## 2015-02-25 MED ORDER — METOPROLOL TARTRATE 50 MG PO TABS *I*
50.0000 mg | ORAL_TABLET | Freq: Two times a day (BID) | ORAL | Status: DC
Start: 2015-02-25 — End: 2015-02-26
  Administered 2015-02-25: 50 mg via ORAL
  Filled 2015-02-25: qty 1

## 2015-02-25 MED ORDER — VANCOMYCIN HCL IN DEXTROSE 5 MG/ML IV SOLN *I*
1000.0000 mg | INTRAVENOUS | Status: DC
Start: 2015-02-26 — End: 2015-02-26
  Filled 2015-02-25: qty 200

## 2015-02-25 MED ORDER — CALCIUM CARBONATE ANTACID 500 MG PO CHEW *I*
500.0000 mg | CHEWABLE_TABLET | Freq: Two times a day (BID) | ORAL | Status: DC
Start: 2015-02-25 — End: 2015-02-25

## 2015-02-25 MED ORDER — AMLODIPINE BESYLATE 5 MG PO TABS *I*
5.0000 mg | ORAL_TABLET | Freq: Every day | ORAL | Status: DC
Start: 2015-02-26 — End: 2015-02-25
  Filled 2015-02-25: qty 1

## 2015-02-25 MED ORDER — METOPROLOL SUCCINATE 50 MG PO TB24 *I*
100.0000 mg | ORAL_TABLET | Freq: Every day | ORAL | Status: DC
Start: 2015-02-25 — End: 2015-02-25

## 2015-02-25 MED ORDER — MORPHINE SULFATE 2 MG/ML IV SOLN *WRAPPED*
2.0000 mg | Status: DC | PRN
Start: 2015-02-25 — End: 2015-02-26
  Filled 2015-02-25: qty 1

## 2015-02-25 MED ORDER — ATORVASTATIN CALCIUM 20 MG PO TABS *I*
20.0000 mg | ORAL_TABLET | Freq: Every day | ORAL | Status: DC
Start: 2015-02-25 — End: 2015-03-03
  Administered 2015-02-25 – 2015-03-02 (×6): 20 mg via ORAL
  Filled 2015-02-25 (×7): qty 1

## 2015-02-25 MED ORDER — CALCIUM CARBONATE ANTACID 500 MG PO CHEW *I*
500.0000 mg | CHEWABLE_TABLET | Freq: Two times a day (BID) | ORAL | Status: DC
Start: 2015-02-25 — End: 2015-03-03
  Administered 2015-02-25 – 2015-03-03 (×12): 500 mg via ORAL
  Filled 2015-02-25 (×12): qty 1

## 2015-02-25 MED ORDER — MORPHINE SULFATE 4 MG/ML IV SOLN *WRAPPED*
4.0000 mg | INTRAVENOUS | Status: DC | PRN
Start: 2015-02-25 — End: 2015-02-26

## 2015-02-25 MED ORDER — MORPHINE SULFATE 4 MG/ML IV SOLN *WRAPPED*
4.0000 mg | Freq: Once | INTRAVENOUS | Status: AC
Start: 2015-02-25 — End: 2015-02-25
  Administered 2015-02-25: 4 mg via INTRAVENOUS
  Filled 2015-02-25: qty 1

## 2015-02-25 MED ORDER — ONDANSETRON HCL 2 MG/ML IV SOLN *I*
4.0000 mg | Freq: Four times a day (QID) | INTRAMUSCULAR | Status: DC | PRN
Start: 2015-02-25 — End: 2015-02-26
  Administered 2015-02-25: 4 mg via INTRAVENOUS
  Filled 2015-02-25: qty 2

## 2015-02-25 NOTE — ED Notes (Signed)
[facility]    Chief Complaint:   Chief Complaint   Patient presents with    Fall     Tripped and fell while walking with his cane at home.  Reports R upper leg pain.  Denies LOC.  Pt is on Eloquis.  Lives with family.      Reason for Visit: Fall    Active Issues/Concerns: Fx right femur    To Do List: Pain management, ortho to see    Code Status: Unknown    Last Filed Vitals    02/25/15 1343   BP: 125/65   Pulse:    Resp: 19   Temp: 36.2 C (97.2 F)   SpO2: 97%       Orientation: Alert and Oriented X 3.    Ambulation Aides: a cane    Toileting: bedpan/commode    Fall Risk: Morse Fall Risk Level: High (45 and higher)  Patient is a fall risk (Morse score >24): Yes   Risk factors for falls: yes    Braden Score: Braden Scale Score: 17  Has a speciality mattress been ordered? no    Ready bed confirmed with nursing staff on [address].  Pt to be transported ASAP.  Please call extension [phone] with questions.    Doristine Locksathleen Haizel Gatchell, RN

## 2015-02-25 NOTE — Progress Notes (Addendum)
Pt BP 78/50. Pt asymptomatic with no complaints. Notified Jiles CrockerMeghan Kelly, Ortho resident. Awaiting further orders at this time. Greggory Stallionhristy E Kerston Landeck, RN    Recheck BP 80/50. Notified Jiles CrockerMeghan Kelly, ortho resident. Per Dr. Landry DykeKelly's order, NS 500ml bolus to be given. Nursing will recheck BP one hour after completion of bolus. Greggory Stallionhristy E Wallie Lagrand, RN

## 2015-02-25 NOTE — ED Notes (Signed)
Pt to xray
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
## 2015-02-25 NOTE — Progress Notes (Signed)
Pt admitted to W6 via stretcher. Pt oriented to W6, room and call system. Pt AOx3, VSS, c/o 4/10 right leg pain but pt declining pain medication at this time. Pt currently resting in bed with family at bedside. Greggory Stallionhristy E Sonny Poth, RN

## 2015-02-25 NOTE — ED Provider Progress Notes (Signed)
ED Provider Progress Note    X-ray showing femur fracture.  Orthopedics consult.  Patient will be admitted orthopedics     [facility] Mat[facility] Dimaano, DO, 02/25/2015, 12:47 PM         Arleth Mccullar, Jonny RuizJohn, DO  02/25/15 1247

## 2015-02-25 NOTE — Provider Consult (Signed)
Geriatrics Assessment for [facility] Patient     LOS: 0 days      Consultant: Renelda MomOREY E Kelli Robeck, MD   Orthopaedic Surgeon: Arvil PersonsGiordano, Brian D, MD   Primary Care Provider: Unknown, Provider     Identifying Data: 82 y.o. male patient from house.  [x]  Good historian  []  History limited by:   []  Delirium   []  Dementia   []  Condition  [x]  Additional history obtained from:   [x]  Family   []  PCP   []  Other providers   []  [facility] records   []  Office records   []  SNF/ALF/other hospital/facility records  and summarized with below    Chief Complaint: Right hip fracture, HTN, CAD and Abnormal EKG  Asked to see patient for perioperative medical management by surgeon above.    History of Present Illness:  Mr. Jonathan Cisneros is 82 yo male with history of cad s/p cabg many years ago as well as CHF and afib who [place] He has been doing well, but had pna several weeks ago.  He fell this am at home due to misplacing a cane at a step down and lost balance and fell onto right side.  He lives with granddaughter, who was present at time and able to call for EMIS.  He had immedicate, severe, R hip pain and could not bear weight  He denies recent chest pain or sob.  He has history of cad with cabg, but no recent symptoms. He has diagnosis of CHF, but denies having issues with heart failure.  He says he was diagnosed with afib severl months ago and started on eliquis.  Per dtr, he saw Dr. Andree CossWorner here in [place] and cards eval was ok.  No one is aware of past EF.  He exercizes regularly on statinonary bike.  Does about 30 min/day - no symptoms with activity.     Took his eliquis this am.       Past Medical History   Diagnosis Date    Atrial fibrillation     CHF (congestive heart failure)     Hypertension        History reviewed. No pertinent past surgical history.    Prescriptions Prior to Admission   Medication Sig    amLODIPine-atorvastatatin (CADUET) 5-20 MG  per tablet Take 1 tablet by mouth daily    ELIQUIS 2.5 MG tablet Take 2.5 mg by mouth 2 times daily    benzonatate (TESSALON) 100 MG capsule Take 100 mg by mouth 3 times daily as needed    furosemide (LASIX) 40 MG tablet Take 40 mg by mouth daily    metoprolol (TOPROL-XL) 200 MG 24 hr tablet Take 100 mg by mouth daily    KLOR-CON M20 20 MEQ  tablet Take 20 mEq by mouth daily    valsartan (DIOVAN) 320 MG tablet Take 320 mg by mouth daily       Current Facility-Administered Medications   Medication Dose Route Frequency    atorvastatin (LIPITOR) tablet 20 mg  20 mg Oral Daily    haloperidol lactate (HALDOL) injection 0.5 mg  0.5 mg Intravenous Q6H PRN    acetaminophen (TYLENOL) tablet 1,000 mg  1,000 mg Oral 3 times per day    [START ON 02/26/2015] sodium chloride 0.9 % IV  100 mL/hr Intravenous Continuous    oxyCODONE (ROXICODONE) IR tablet 2.5 mg  2.5 mg Oral Q2H PRN    Or    oxyCODONE (ROXICODONE) IR tablet  5 mg  5 mg Oral Q2H PRN    morphine 2 mg/mL injection 2 mg  2 mg Intravenous Q2H PRN    Or    morphine 4 MG/ML injection 4 mg  4 mg Intravenous Q2H PRN    metoprolol (LOPRESSOR) tablet 50 mg  50 mg Oral 2 times per day    [START ON 02/26/2015] Vancomycin (VANCOCIN) IV 1,000 mg  1,000 mg Intravenous Daily       Allergies   Allergen Reactions    Penicillins Rash       History reviewed. No pertinent family history.    Social History     Social History    Marital status: Widowed     Spouse name: N/A    Number of children: N/A    Years of education: N/A     Social History Main Topics    Smoking status: Never Smoker    Smokeless tobacco: None    Alcohol use Yes    Drug use: None    Sexual activity: Not Asked     Other Topics Concern    None     Social History Narrative    None       Advance Directives:    DNI: yes    DNR: yes   []  Hold DNR/DNI in OR (discussed with patient or surrogate)   []  DO NOT Hold DNR/DNI in OR (discussed with patient or surrogate)   [x]  OR resuscitation status requires  further clarificaton    Health Care Proxy: yes    Living Will: unknown    Health Care Proxy Name/Relationship: daughter  System Code Status: DNR/DNI   [x]  MOLST at home - family bringing in  []  MOLST completed today    Function: Independent - uses cane    ROS   No fevers or chills  Eating well  No abd pain, no diarreha, no constipation  No chest pain or sob or doe  No dysuria  Sleeps well  Otherwise negative     Vitals:  Visit Vitals    BP 129/60 (BP Location: Right arm)    Pulse 92    Temp 36.5 C (97.7 F) (Temporal)    Resp 18    Ht 1.626 m (5\' 4" )    Wt 59 kg (130 lb)    SpO2 96%    BMI 22.31 kg/m2     BP: (123-129)/(60-69)   Temp:  [36.2 C (97.2 F)-36.5 C (97.7 F)]   Temp src: Temporal (01/14 1435)  Heart Rate:  [82-92]   Resp:  [18-19]   SpO2:  [95 %-97 %]   Height:  [162.6 cm (5\' 4" )]   Weight:  [59 kg (130 lb)]      Last Nursing documented pain:  0-10 Scale: 4 (02/25/15 1433)       Physical Examination:  Gen:  NAD  Eyes: anicteric sclera, eomi  ENT: MMM  Neck: no thyromegaly  Chest: clear b/l  CVS: irreg rhythm, normal rate  ABD: soft, NT +bs  GU: no foley  MSK:  RLE ext rotated  Skin:  No rashes  Neuro:no focal motor deficits  Mental status: awake and alert      Lab Results:   All labs in the last 24 hours:   Recent Results (from the past 24 hour(s))   CBC and differential    Collection Time: 02/25/15 11:37 AM   Result Value Ref Range    WBC 7.6 4.2 - 9.1 THOU/uL    RBC  4.1 (L) 4.6 - 6.1 MIL/uL    Hemoglobin 12.7 (L) 13.7 - 17.5 g/dL    Hematocrit 40 40 - 51 %    MCV 97 (H) 79 - 92 fL    MCH 31 26 - 32 pg    MCHC 32 32 - 37 g/dL    RDW 16.1 09.6 - 04.5 %    Platelets 273 150 - 330 THOU/uL    Seg Neut % 66.2 %    Lymphocyte % 21.1 %    Monocyte % 9.9 %    Eosinophil % 1.6 %    Basophil % 0.8 %    Neut # K/uL 5.0 1.8 - 5.4 THOU/uL    Lymph # K/uL 1.6 1.3 - 3.6 THOU/uL    Mono # K/uL 0.8 0.3 - 0.8 THOU/uL    Eos # K/uL 0.1 0.0 - 0.5 THOU/uL    Baso # K/uL 0.1 0.0 - 0.1 THOU/uL    Nucl RBC % 0.0 0.0  - 0.2 /100 WBC    Nucl RBC # K/uL 0.0 0.0 - 0.0 THOU/uL    IMM Granulocytes # 0.0 0.0 - 0.1 THOU/uL    IMM Granulocytes 0.4 %   Basic metabolic panel    Collection Time: 02/25/15 11:37 AM   Result Value Ref Range    Glucose 110 (H) 60 - 99 mg/dL    Sodium 409 811 - 914 mmol/L    Potassium 4.0 3.3 - 5.1 mmol/L    Chloride 103 96 - 108 mmol/L    CO2 25 20 - 28 mmol/L    Anion Gap 13 7 - 16    UN 18 6 - 20 mg/dL    Creatinine 7.82 9.56 - 1.17 mg/dL    GFR,Caucasian 72 *    GFR,Black 83 *    Calcium 8.9 8.6 - 10.2 mg/dL       Imaging findings: * Femur Right Standard Ap And Lateral    Result Date: 02/25/2015  IMPRESSION:  Proximal right femoral fracture  END OF REPORT    * Hip Right Ap And Lateral Views    Result Date: 02/25/2015  IMPRESSION:  Proximal right femoral fracture  END OF REPORT       EKG:    Afib, LBBB     Medical Decision Making:    Active Problem List  Patient Active Problem List   Diagnosis Code    Closed displaced subtrochanteric fracture of right femur, initial encounter S72.21XA    HTN (hypertension) I10    A-fib I48.91    CAD (coronary artery disease) I25.10    CHF (congestive heart failure) I50.9        GFC Perioperative Assessment/Recommendations  Surgical Fitness: No medical contraindications to proposed procedure. Intermediate risk  Bone Density: Probable osteoporosis and Possible osteomalacia  Delirium Risk: not increased  Prognosis: fair   Rehabilitation Potential: good   Delirium and other risks can be reduced by:   Early ambulation  Minimizing "tethers" - IV's, oxygen, catheters, etc  Avoiding hypnotics and sedatives  Maintaining hydration and nutrition  Avoiding anticholergics - diphenhydramine, etc  Pain control  Supportive environment  Consider hold ACE Inhibitors, ARB's, and diuretics for OR  Continue home beta blockers as tolerated  Medically preferred DVT prophylaxis: LMWH - bridge to restarting eliquis  Metabolic bone work up: Calcium, TSH, iPTH and Vit D-25 OH    Geriatric Medicine  Assessment/Plan/Recommendations:  Amarius Toto  is a 82 y.o. male with a history of cad, afib and  chf who presents with R hip fx.  Regarding the cardiopulmonary status, the patient is Moderate Risk for this Moderate Risk surgery.  There is a Moderate Risk risk of post-operative complications, such as acute blood loss anemia given the history of anticoagulation use.  Overall, the benefits of the surgery outweigh the risks, as optimizing function is the goal.  The patient is medically optimized and may proceed to surgery once he has been off eliquis for 24 hours.    Pt has history of cad and chf so has 2 major risk factors and so would be RCRI risk category 3, which is estimated at 6% risk.  Risk of decompensation without surgery would be greater due to loss of function, etc.  In my opinion, benefits of early surgery outweigh risks.     Recommendation by problem:    Anticoagulation -- on eliquis for afib -- took dose this am  --would wait 24 hours prior to surgery  --post op, would use LMWH as dvt prophy until adequate hemostasis allows for resuming full anticoagulation    CAD -- remote cabg.  No recent symptoms.    --continue beta blocker\  --continue statin     afib -- rate controlled.  Did not take his toprol this am  --will continue beta blocker, but change to lopressor 50 bid to allow for more control of dosing peri-op  --holding eliquis as above    HTN -- controlled  --hold valsartan and amlodipne    CHF - chronic.  Unknown EF.  Compensated  --on beta blocker  --holding arb for now  --hold lasix   --ok to hydrate pre-op - will reduce rate to 75 cc/hr as surgery delayed until tomorrow    Discharge Planning: SNF Rehab  I will continue to follow with you to manage medical conditions above.  Advanced Directives: see above    Signed: Renelda Mom, MD  on 02/25/2015  at 3:03 PM  [x]  Total patient care time on the unit was more than 70 minutes and more than 50% was counseling and coordination of care.  Coordination  of Care / Discussed with Ortho HS, Patient and Family    Quality and compliance reminders:  Address advanced directives  Address discharge plan  Document functional status and prognosis  Update problem list for condition present on admission or develop

## 2015-02-25 NOTE — ED Provider Notes (Signed)
History     Chief Complaint   Patient presents with    Fall     Tripped and fell while walking with his cane at home.  Reports R upper leg pain.  Denies LOC.  Pt is on Eloquis.  Lives with family.     HPI Comments: [age] year old male presents the ED with chief complaint of right upper leg pain.  Patient states he was walking this morning when he went to go down a step places cane but missed the step and fell landing on the right leg.  Patient states that afterwards he had severe pain in his right upper leg.  Patient states that it hurts a lot when he tries to move it.  Patient states was not moving air is just at dull ache.  Patient denies any other injury or trauma.  Patient states he did not hit his head or get knocked out.  Patient denies any chest pain or shortness of breath.  Patient denies any numbness or weakness.  Patient denies any fevers or chills.  Patient denies any recent illness.      History provided by:  Patient    Past Medical History   Diagnosis Date    Atrial fibrillation     CHF (congestive heart failure)     Hypertension         History reviewed. No pertinent past surgical history.  History reviewed. No pertinent family history.    Social History    reports that he has never smoked. He does not have any smokeless tobacco history on file. He reports that he drinks alcohol. His drug and sexual activity histories are not on file.    Living Situation     Questions Responses    Patient lives with Family    Homeless No    Caregiver for other family member No    External Services None    Employment Retired    Domestic Violence Risk           Problem List   There is no problem list on file for this patient.      Review of Systems   Review of Systems   Constitutional: Negative for activity change, appetite change, chills and fever.   HENT: Negative for congestion, rhinorrhea, sore throat and trouble swallowing.    Eyes: Negative for pain and visual disturbance.   Respiratory: Negative for cough,  chest tightness, shortness of breath and wheezing.    Cardiovascular: Negative for chest pain.   Gastrointestinal: Negative for abdominal pain, nausea and vomiting.   Genitourinary: Negative for dysuria.   Musculoskeletal: Negative for back pain, neck pain and neck stiffness.   Skin: Negative for rash.   Neurological: Negative for dizziness, seizures, syncope, weakness, light-headedness, numbness and headaches.   Psychiatric/Behavioral: Negative for agitation.       Physical Exam       ED Triage Vitals   BP Heart Rate Heart Rate (via Pulse Ox) Resp Temp Temp src SpO2 O2 Device O2 Flow Rate   02/25/15 1113 02/25/15 1113 -- 02/25/15 1113 02/25/15 1113 -- 02/25/15 1113 -- --   123/69 82  18 36.2 C (97.2 F)  95 %        Weight           02/25/15 1113           59 kg (130 lb)               Physical Exam  Constitutional: He is oriented to person, place, and time. He appears well-developed and well-nourished. He is cooperative. He does not have a sickly appearance. He does not appear ill.   Pt appears comfortable   HENT:   Head: Normocephalic and atraumatic.   Right Ear: Hearing and external ear normal.   Left Ear: Hearing and external ear normal.   Nose: Nose normal.   Mouth/Throat: Oropharynx is clear and moist and mucous membranes are normal. Mucous membranes are not pale and not dry.   Eyes: Conjunctivae and lids are normal. Pupils are equal, round, and reactive to light. Right eye exhibits no discharge. Left eye exhibits no discharge. No scleral icterus.   Neck: Normal range of motion. No spinous process tenderness and no muscular tenderness present.   Cardiovascular: Normal rate, regular rhythm, S1 normal, S2 normal, normal heart sounds and intact distal pulses.  Exam reveals no gallop, no distant heart sounds and no friction rub.    No murmur heard.  Pulmonary/Chest: Effort normal and breath sounds normal. He has no decreased breath sounds. He has no wheezes. He has no rhonchi. He has no rales. He exhibits no  tenderness.   Abdominal: Soft. Normal appearance and bowel sounds are normal. He exhibits no distension. There is no tenderness. There is no rigidity, no rebound and no guarding.   Musculoskeletal: Normal range of motion. He exhibits tenderness. He exhibits no edema.   Patient with point tenderness over the right greater trochanter.  Patients leg is externally rotated.  Patient does have good sensation and is neurovascularly intact distal to the point of injury.  Patient with 2+ pedal pulse.   Lymphadenopathy:     He has no cervical adenopathy.        Right: No supraclavicular adenopathy present.        Left: No supraclavicular adenopathy present.   Neurological: He is alert and oriented to person, place, and time. He has normal strength. He displays no tremor. No sensory deficit. He exhibits normal muscle tone. He displays no seizure activity. Coordination normal. GCS eye subscore is 4. GCS verbal subscore is 5. GCS motor subscore is 6.   Alert, Following commands, Moving all 4 extremities   Skin: Skin is warm and dry. No rash noted. He is not diaphoretic.   Psychiatric: He has a normal mood and affect. His speech is normal and behavior is normal. Judgment and thought content normal. Cognition and memory are normal.   Nursing note and vitals reviewed.      Medical Decision Making      Amount and/or Complexity of Data Reviewed  Clinical lab tests: ordered and reviewed  Tests in the radiology section of CPT: reviewed and ordered  Review and summarize past medical records: yes  Discuss the patient with other providers: yes  Independent visualization of images, tracings, or specimens: yes        Initial Evaluation:  ED First Provider Contact     Date/Time Event User Comments    02/25/15 1115 ED Provider First Contact Jeanae Whitmill, [facility] Initial Face to Face Provider Contact          Patient seen by me today 02/25/2015 at 11:15 AM    Assessment:  82 y.o.male comes to the ED with right upper leg pain after a mechanical fall  at home.    Differential Diagnosis includes Fracture, Dislocation, Strain, Sprain, Contusion, Hematoma    Plan: CBC, BMP, EKG, IV morphine/Zofran, right hip/femur x-ray, consult orthopedics per radiology results, Reassurance, Reevaluation.  Ultimate disposition pending ED work up, radiology, and symptom resolution.    [address] DO         Elanor Cale, August, DO  02/25/15 1128

## 2015-02-26 ENCOUNTER — Encounter: Payer: Self-pay | Admitting: Pain Medicine

## 2015-02-26 ENCOUNTER — Encounter
Admission: EM | Disposition: A | Discharge status: Skilled Nursing Facility | Payer: Self-pay | Source: Ambulatory Visit | Attending: Orthopedic Surgery

## 2015-02-26 ENCOUNTER — Encounter: Payer: Self-pay | Admitting: Emergency Medicine

## 2015-02-26 LAB — BASIC METABOLIC PANEL
Anion Gap: 11 (ref 7–16)
CO2: 23 mmol/L (ref 20–28)
Calcium: 8.2 mg/dL — ABNORMAL LOW (ref 8.6–10.2)
Chloride: 107 mmol/L (ref 96–108)
Creatinine: 0.78 mg/dL (ref 0.67–1.17)
GFR,Black: 86 *
GFR,Caucasian: 74 *
Glucose: 148 mg/dL — ABNORMAL HIGH (ref 60–99)
Lab: 17 mg/dL (ref 6–20)
Potassium: 3.9 mmol/L (ref 3.3–5.1)
Sodium: 141 mmol/L (ref 133–145)

## 2015-02-26 LAB — TYPE AND SCREEN
ABO RH Blood Type: O POS
Antibody Screen: NEGATIVE

## 2015-02-26 LAB — HCT AND HGB
Hematocrit: 28 % — ABNORMAL LOW (ref 40–51)
Hematocrit: 32 % — ABNORMAL LOW (ref 40–51)
Hemoglobin: 9.2 g/dL — ABNORMAL LOW (ref 13.7–17.5)
Hemoglobin: 10.5 g/dL — ABNORMAL LOW (ref 13.7–17.5)

## 2015-02-26 LAB — RED BLOOD CELLS
Coded Blood type: 5100
Component blood type: O POS

## 2015-02-26 LAB — MCHC
MCHC: 33 g/dL (ref 32–37)
MCHC: 32 g/dL (ref 32–37)
MCHC: 33 g/dL (ref 32–37)

## 2015-02-26 LAB — HEMATOCRIT: Hematocrit: 33 % — ABNORMAL LOW (ref 40–51)

## 2015-02-26 SURGERY — INTERNAL FIXATION, FEMUR, USING TROCHANTERIC NAIL
Anesthesia: General | Laterality: Right | Wound class: Clean

## 2015-02-26 MED ORDER — SODIUM CHLORIDE 0.9 % IV SOLN WRAPPED *I*
3.0000 mL/h | Status: DC
Start: 2015-02-26 — End: 2015-02-26

## 2015-02-26 MED ORDER — OXYCODONE HCL 5 MG PO TABS *I*
2.5000 mg | ORAL_TABLET | ORAL | Status: DC | PRN
Start: 2015-02-26 — End: 2015-03-03
  Administered 2015-02-27: 2.5 mg via ORAL
  Filled 2015-02-26: qty 1

## 2015-02-26 MED ORDER — VANCOMYCIN HCL IN DEXTROSE 5 MG/ML IV SOLN *I*
1000.0000 mg | Freq: Two times a day (BID) | INTRAVENOUS | Status: AC
Start: 2015-02-26 — End: 2015-02-26
  Administered 2015-02-26: 1000 mg via INTRAVENOUS
  Filled 2015-02-26: qty 200

## 2015-02-26 MED ORDER — VANCOMYCIN HCL IN DEXTROSE 5 MG/ML IV SOLN *I*
INTRAVENOUS | Status: AC
Start: 2015-02-26 — End: 2015-02-26
  Filled 2015-02-26: qty 200

## 2015-02-26 MED ORDER — LIDOCAINE HCL 2 % (PF) IJ SOLN *I*
INTRAMUSCULAR | Status: AC
Start: 2015-02-26 — End: 2015-02-26
  Filled 2015-02-26: qty 5

## 2015-02-26 MED ORDER — ONDANSETRON HCL 2 MG/ML IV SOLN *I*
4.0000 mg | Freq: Once | INTRAMUSCULAR | Status: DC | PRN
Start: 2015-02-26 — End: 2015-02-26

## 2015-02-26 MED ORDER — ROCURONIUM BROMIDE 10 MG/ML IV SOLN *WRAPPED*
Status: DC | PRN
Start: 2015-02-26 — End: 2015-02-26
  Administered 2015-02-26: 20 mg via INTRAVENOUS
  Administered 2015-02-26: 10 mg via INTRAVENOUS

## 2015-02-26 MED ORDER — SENNOSIDES 8.6 MG PO TABS *I*
2.0000 | ORAL_TABLET | Freq: Every day | ORAL | Status: DC
Start: 2015-02-26 — End: 2015-03-03
  Administered 2015-02-26 – 2015-03-03 (×6): 2 via ORAL
  Filled 2015-02-26 (×6): qty 2

## 2015-02-26 MED ORDER — ENOXAPARIN SODIUM 40 MG/0.4ML IJ SOSY *I*
40.0000 mg | PREFILLED_SYRINGE | INTRAMUSCULAR | Status: DC
Start: 2015-02-27 — End: 2015-03-03
  Administered 2015-02-27 – 2015-03-02 (×4): 40 mg via SUBCUTANEOUS
  Filled 2015-02-26 (×4): qty 0.4

## 2015-02-26 MED ORDER — VITAMIN D 2000 UNIT PO TABS *I*
2000.0000 [IU] | ORAL_TABLET | Freq: Every day | ORAL | Status: DC
Start: 2015-02-26 — End: 2015-03-03
  Administered 2015-02-26 – 2015-03-03 (×6): 2000 [IU] via ORAL
  Filled 2015-02-26 (×6): qty 1

## 2015-02-26 MED ORDER — NALBUPHINE HCL 20 MG/ML IJ SOLN *I*
3.0000 mg | INTRAMUSCULAR | Status: DC | PRN
Start: 2015-02-26 — End: 2015-03-03

## 2015-02-26 MED ORDER — ONDANSETRON HCL 2 MG/ML IV SOLN *I*
4.0000 mg | Freq: Four times a day (QID) | INTRAMUSCULAR | Status: DC | PRN
Start: 2015-02-26 — End: 2015-03-03

## 2015-02-26 MED ORDER — ETOMIDATE 2 MG/ML IV SOLN *I*
INTRAVENOUS | Status: AC
Start: 2015-02-26 — End: 2015-02-26
  Filled 2015-02-26: qty 10

## 2015-02-26 MED ORDER — SODIUM CHLORIDE 0.9 % IV BOLUS *I*
500.0000 mL | Freq: Once | Status: AC
Start: 2015-02-26 — End: 2015-02-26
  Administered 2015-02-26: 500 mL via INTRAVENOUS

## 2015-02-26 MED ORDER — CAMPHOR-MENTHOL 0.5-0.5 % EX LOTN *I*
TOPICAL_LOTION | CUTANEOUS | Status: DC | PRN
Start: 2015-02-26 — End: 2015-03-03

## 2015-02-26 MED ORDER — HYDROMORPHONE HCL PF 1 MG/ML IJ SOLN *WRAPPED*
0.4000 mg | INTRAMUSCULAR | Status: DC | PRN
Start: 2015-02-26 — End: 2015-02-26

## 2015-02-26 MED ORDER — BUPIVACAINE-EPINEPHRINE 0.5 % IJ SOLUTION *WRAPPED*
INTRAMUSCULAR | Status: DC | PRN
Start: 2015-02-26 — End: 2015-02-26
  Administered 2015-02-26: 30 mL via PERINEURAL

## 2015-02-26 MED ORDER — LACTATED RINGERS IV SOLN *I*
125.0000 mL/h | INTRAVENOUS | Status: DC
Start: 2015-02-26 — End: 2015-02-26
  Administered 2015-02-26: 125 mL/h via INTRAVENOUS

## 2015-02-26 MED ORDER — ONDANSETRON HCL 2 MG/ML IV SOLN *I*
INTRAMUSCULAR | Status: AC
Start: 2015-02-26 — End: 2015-02-26
  Filled 2015-02-26: qty 2

## 2015-02-26 MED ORDER — BISACODYL 10 MG RE SUPP *I*
10.0000 mg | Freq: Once | RECTAL | Status: DC
Start: 2015-02-28 — End: 2015-02-27

## 2015-02-26 MED ORDER — CALCIUM CITRATE-VITAMIN D 315-250 MG-UNIT PO TABS *I*
1.0000 | ORAL_TABLET | Freq: Two times a day (BID) | ORAL | Status: DC
Start: 2015-02-26 — End: 2015-03-03
  Administered 2015-02-26 – 2015-03-03 (×11): 1 via ORAL
  Filled 2015-02-26 (×11): qty 1

## 2015-02-26 MED ORDER — BISACODYL 10 MG RE SUPP *I*
10.0000 mg | Freq: Every day | RECTAL | Status: DC | PRN
Start: 2015-02-26 — End: 2015-03-03

## 2015-02-26 MED ORDER — GLYCOPYRROLATE 0.2 MG/ML IJ SOLN *I*
INTRAMUSCULAR | Status: DC | PRN
Start: 2015-02-26 — End: 2015-02-26
  Administered 2015-02-26: 0.2 mg via INTRAVENOUS

## 2015-02-26 MED ORDER — VANCOMYCIN HCL 1000 MG IV SOLR WRAPPED *I*
INTRAVENOUS | Status: DC | PRN
  Administered 2015-02-26: 1000 mg via INTRAVENOUS

## 2015-02-26 MED ORDER — DEXAMETHASONE SODIUM PHOSPHATE 4 MG/ML INJ SOLN *WRAPPED*
INTRAMUSCULAR | Status: DC | PRN
Start: 2015-02-26 — End: 2015-02-26
  Administered 2015-02-26: 4 mg via INTRAVENOUS

## 2015-02-26 MED ORDER — PROMETHAZINE HCL 25 MG/ML IJ SOLN *I*
6.2500 mg | Freq: Once | INTRAMUSCULAR | Status: DC | PRN
Start: 2015-02-26 — End: 2015-02-26

## 2015-02-26 MED ORDER — ETOMIDATE 2 MG/ML IV SOLN *I*
INTRAVENOUS | Status: DC | PRN
Start: 2015-02-26 — End: 2015-02-26
  Administered 2015-02-26: 12 mg via INTRAVENOUS

## 2015-02-26 MED ORDER — HALOPERIDOL 0.5 MG PO TABS *I*
0.5000 mg | ORAL_TABLET | Freq: Four times a day (QID) | ORAL | Status: DC | PRN
Start: 2015-02-26 — End: 2015-03-03

## 2015-02-26 MED ORDER — ROCURONIUM BROMIDE 10 MG/ML IV SOLN *WRAPPED*
Status: AC
Start: 2015-02-26 — End: 2015-02-26
  Filled 2015-02-26: qty 5

## 2015-02-26 MED ORDER — ACETAMINOPHEN 500 MG PO TABS *I*
1000.0000 mg | ORAL_TABLET | Freq: Three times a day (TID) | ORAL | Status: DC
Start: 2015-02-26 — End: 2015-03-03
  Administered 2015-02-26 – 2015-03-03 (×15): 1000 mg via ORAL
  Filled 2015-02-26 (×17): qty 2

## 2015-02-26 MED ORDER — MORPHINE SULFATE 4 MG/ML IV SOLN *WRAPPED*
4.0000 mg | INTRAVENOUS | Status: DC | PRN
Start: 2015-02-26 — End: 2015-03-03

## 2015-02-26 MED ORDER — SODIUM CHLORIDE 0.9 % IV SOLN WRAPPED *I*
125.0000 mL/h | Status: DC
Start: 2015-02-26 — End: 2015-03-03
  Administered 2015-02-26 – 2015-02-27 (×2): 125 mL/h via INTRAVENOUS

## 2015-02-26 MED ORDER — FENTANYL CITRATE 50 MCG/ML IJ SOLN *WRAPPED*
INTRAMUSCULAR | Status: DC | PRN
Start: 2015-02-26 — End: 2015-02-26
  Administered 2015-02-26: 09:00:00 100 ug via INTRAVENOUS

## 2015-02-26 MED ORDER — METOPROLOL TARTRATE 25 MG PO TABS *I*
25.0000 mg | ORAL_TABLET | Freq: Two times a day (BID) | ORAL | Status: DC
Start: 2015-02-26 — End: 2015-03-02
  Administered 2015-02-28 – 2015-03-01 (×3): 25 mg via ORAL
  Filled 2015-02-26 (×6): qty 1

## 2015-02-26 MED ORDER — MORPHINE SULFATE 2 MG/ML IV SOLN *WRAPPED*
2.0000 mg | Status: DC | PRN
Start: 2015-02-26 — End: 2015-03-03

## 2015-02-26 MED ORDER — PROPOFOL 10 MG/ML IV EMUL (INTERMITTENT DOSING) WRAPPED *I*
INTRAVENOUS | Status: AC
Start: 2015-02-26 — End: 2015-02-26
  Filled 2015-02-26: qty 20

## 2015-02-26 MED ORDER — FENTANYL CITRATE 50 MCG/ML IJ SOLN *WRAPPED*
INTRAMUSCULAR | Status: AC
Start: 2015-02-26 — End: 2015-02-26
  Filled 2015-02-26: qty 2

## 2015-02-26 MED ORDER — ONDANSETRON HCL 2 MG/ML IV SOLN *I*
INTRAMUSCULAR | Status: DC | PRN
Start: 2015-02-26 — End: 2015-02-26
  Administered 2015-02-26: 4 mg via INTRAMUSCULAR

## 2015-02-26 MED ORDER — OXYCODONE HCL 5 MG PO TABS *I*
5.0000 mg | ORAL_TABLET | ORAL | Status: DC | PRN
Start: 2015-02-26 — End: 2015-03-03

## 2015-02-26 MED ORDER — NEOSTIGMINE METHYLSULFATE 10 MG/10ML IV SOLN *I*
INTRAVENOUS | Status: DC | PRN
Start: 2015-02-26 — End: 2015-02-26
  Administered 2015-02-26: 2 mg via INTRAVENOUS

## 2015-02-26 MED ORDER — POLYETHYLENE GLYCOL 3350 PO PACK 17 GM *I*
17.0000 g | PACK | Freq: Every day | ORAL | Status: DC
Start: 2015-02-26 — End: 2015-02-27
  Administered 2015-02-26: 17 g via ORAL
  Filled 2015-02-26: qty 17

## 2015-02-26 SURGICAL SUPPLY — 30 items
14mm/130 DEG TI CANN TROCH FIXATION NAIL 400 MM/RI (Nail) ×2 IMPLANT
BIT DRILL 4.0MM 3-FLUTED 195MM (Supply) ×2 IMPLANT
BLADE SURG CARBON STEEL #10 STER (Supply) ×2 IMPLANT
BLANKET UPPER BODY TEMP THERAPY (Drape) ×2 IMPLANT
DRAPE SHEET 70X100 (Drape) ×1
DRAPE SUR W70XL100IN STD SMS POLYPR FULL SHT W/O FLD PCH DISP (Drape) ×1 IMPLANT
DRAPE SURG IOBAN 2 ISOLATION LG (Drape) ×2 IMPLANT
DRESSING ABD PAD DERMACEA 5 X 9IN STER (Dressing) ×4 IMPLANT
DRESSING TEGADERM 4 X 10IN (Dressing) ×2 IMPLANT
DRESSING TEGADERM W/LABEL 4 X 4 3/4IN (Dressing) ×2 IMPLANT
GLOVE SURG PROTEXIS PI 7.0 PF SYN (Glove) ×20 IMPLANT
GLOVE SURG PROTEXIS PI CLASSIC 8.0 PF SYN (Glove) ×2 IMPLANT
GOWN SIRUS NONREINFORCED SET IN XXL (Gown) ×4 IMPLANT
GUIDEWIRE 3.2 X 400MM (Supply) ×4 IMPLANT
PACK CUSTOM LOWER EXTREMITY PACK (Pack) ×2 IMPLANT
PACK MAJOR LINEN (Other) ×1
PACK SURGICAL PROCEDURE LINEN MAJOR (Other) ×1 IMPLANT
PADDING WEBRIL 4IN LF NONSTER (Dressing) ×4 IMPLANT
ROD REAM BALL TIP 2.5 (Supply) ×2 IMPLANT
SCREW 5.0MM LOCK TI 50MM IM NL (Screw) ×2 IMPLANT
SCREW 5.0MM LOCK TI 56MM IM NL (Screw) ×2 IMPLANT
SCREW BNE L105MM DIA11MM FEM G TI CANN FOR TROCHANTERIC NAIL FIX SYS (Screw) ×2 IMPLANT
SOL SODIUM CHLORIDE IRRIG 1000ML BTL (Solution) ×2 IMPLANT
SOL WATER IRRIG STERILE 1000ML BTL (Solution) ×2 IMPLANT
SUCTION YANKAUER HIGH CAP. (Supply) ×2 IMPLANT
SUTR VICRYL ANTIB BRD 2-0 CP-2 18 UNDYED (Suture) ×2 IMPLANT
SUTR VICRYL ANTIB O CT-1 POP 18 VILOT (Suture) ×2 IMPLANT
SYRINGE IRRIG BULB 50CC (Supply) ×2 IMPLANT
TAPE AC ELAST ADH 3IN X 5YD LF (Supply) ×2 IMPLANT
TAPE DURAPORE 3IN SILK NL (Dressing) ×2 IMPLANT

## 2015-02-26 NOTE — Anesthesia Procedure Notes (Signed)
---------------------------------------------------------------------------------------------------------------------------------------    AIRWAY   GENERAL INFORMATION AND STAFF    Patient location during procedure: OR       Date of Procedure: 02/26/2015 9:15 AM  CONDITION PRIOR TO MANIPULATION     Current Airway/Neck Condition:  Normal        For more airway physical exam details, see Anesthesia PreOp Evaluation  AIRWAY METHOD     Patient Position:  Sniffing    Preoxygenated: yes      Induction: IV    Mask Difficulty Assessment:  1 - vent by mask       Mask NMB: 1 - vent by mask      Technique Used for Successful ETT Placement:  Direct laryngoscopy    Blade Type:  Macintosh    Laryngoscope Blade/Video laryngoscope Blade Size:  3    Cormack-Lehane Classification:  Grade I - full view of glottis    Placement Verified by: capnometry and auscultation      Number of Attempts at Approach:  1  FINAL AIRWAY DETAILS    Final Airway Type:  Endotracheal airway    Final Endotracheal Airway:  ETT      Cuffed: cuffed    Insertion Site:  Oral    ETT Size (mm):  7.0  ----------------------------------------------------------------------------------------------------------------------------------------

## 2015-02-26 NOTE — OR Nursing (Signed)
Patient entered OR wearing wristband with wrong spelling of name Jonathan Cisneros("Kush" instead of "Cipriani"). OR staff made aware of discrepancy upon it suddenly changing in eRecord and when trying to order blood. Surgical team made aware and contacted Nursing supervisor and admitting personnel. Correct patient information confirmed with admitting. Corrected patient information (stickers and ID band) brought to OR and placed on patient and accordingly.

## 2015-02-26 NOTE — Progress Notes (Signed)
Home Health Assessment    Completed by: Lollie MarrowJolynn Jaquay Morneault, RN  Phone: [phone]      Referred by: French Anaracy, Social Work Weekends      Source of Information: E-records    Home Health indicators present: Skilled Nursing and PT      Barriers to discharge to be addressed: TBD      Plan: Clinical research associateWriter reviewed chart. [facility] will continue to follow for all home care services. Please call [facility] with any questions and/or concerns related to discharge planning.  Lollie MarrowJolynn Jeanpaul Biehl, RN

## 2015-02-26 NOTE — Progress Notes (Signed)
Orthopaedic Surgery Progress Note    Patient Name: Jonathan Cisneros  MRN: [id number]  DOA: 02/25/2015 12:50 PM    Subjective: Slept okay.  Pain controlled.  Required 2x bolus (500 cc) ON for low SBPs (89)      Objective:  BP: (78-129)/(48-69)   Temp:  [36.2 C (97.2 F)-37.1 C (98.8 F)]   Temp src: Temporal (01/15 0551)  Heart Rate:  [75-92]   Resp:  [16-19]   SpO2:  [95 %-98 %]   Height:  [162.6 cm (5' 4")]   Weight:  [59 kg (130 lb)]     Intake/Output Summary (Last 24 hours) at 02/26/15 0614  Last data filed at 02/26/15 0520   Gross per 24 hour   Intake   1360 ml   Output   1150 ml   Net    210 ml       General: Blood pressure 96/53, pulse 75, temperature 36.8 C (98.2 F), temperature source Temporal, resp. rate 16, height 1.626 m (5' 4"), weight 59 kg (130 lb), SpO2 95 %.  Comfortable, no acute distress    Right Lower Extremity  Intact toe flexion/extension  Intact sensation to light touch medial/lateral/dorsal/plantar/1st DWS of foot  Toes WWP      Labs:    Recent Labs  Lab 02/25/15  1943 02/25/15  1137   WBC  --  7.6   HEMOGLOBIN 11.0* 12.7*   HEMATOCRIT 34* 40   PLATELETS  --  273       Recent Labs  Lab 02/25/15  1137   SODIUM 141   POTASSIUM 4.0   CHLORIDE 103   CO2 25       No components found with this basename: BUN, CREATININE, LABGLOM, GLUCOSE, CALCIUM    Recent Labs  Lab 02/25/15  1137   INR 1.4*       No components found with this basename: APTT  No results for input(s): ESR, CRP in the last 168 hours.    Assessment: Jonathan Cisneros is a 82 y.o. yo male with a R IT fx plan for OR today    Plan:  1. OR today - consent in chart  2. Cleared by geri - appreciate co-management  3. Vanco OCTOR  4. Pain control  5. NPO/IV fluids  6. Bedrest  7. DVT prophylaxis: on hold for OR    Shearon Stalls, MD  02/26/2015  6:14 AM

## 2015-02-26 NOTE — Anesthesia Preprocedure Evaluation (Addendum)
Anesthesia Pre-operative History and Physical for Jonathan Cisneros    ______________________________________________________________________________________  CPM Assessment Not Completed  <URMCANSURGSITE>  Anesthesia Evaluation Information Source: patient, records     ANESTHESIA     Denies anesthesia history    GENERAL     Denies general issues    HEENT     Denies HEENT issues PULMONARY     Denies pulmonary issues    CARDIOVASCULAR  Good(4+METs) Exercise Tolerance    + Hypertension    + CAD    + Anticoagulants    + Dysrhythmias            atrial fib    + CHF    GI/HEPATIC/RENAL  Last PO Intake: >8hr before procedure NEURO/PSYCH     Denies neuro/psych issues    ENDO/OTHER     Denies endo issues    HEMALOGIC     Denies hematologic issues       Physical Exam    Airway            Mouth opening: normal            Mallampati: I            TM distance (fb): >3 FB            TM distance (cm): 4            Neck ROM: full  Dental    Upper: dentures   Cardiovascular           Rhythm: irregular    Neurologic    Normal Exam     Pulmonary   Normal Exam         ________________________________________________________________________  Plan  ASA Score  3  Anesthetic Plan general    Induction (routine IV); General Anesthesia/Sedation Maintenance Plan (inhaled agents);  Airway Manipulation (direct laryngoscopy); Airway (cuffed ETT); Line ( use current access); Monitoring (standard ASA); Positioning (supine); PONV Plan (dexamethasone and ondansetron); Pain (per surgical team); PostOp (PACU)    Informed Consent     Risks:          Risks discussed were commensurate with the plan listed above with the following specific points: N/V, aspiration and sore throat , damage to:(eyes, nerves, teeth), awareness, unexpected serious injury, allergic Rx    Anesthetic Consent:      Anesthetic plan (and risks as noted above) were discussed with patient and adult children    Blood products Consent:        Use of blood products discussed with: patient and  adult children     Attending Attestation:  As the primary attending anesthesiologist, I attest that the patient or proxy understands and accepts the risks and benefits of the anesthesia plan. I also attest that I have personally performed a pre-anesthetic examination and evaluation, and prescribed the anesthetic plan for this particular location within 48 hours prior to the anesthetic as documented.

## 2015-02-26 NOTE — Op Note (Signed)
Jonathan Cisneros, Jonathan Cisneros  MR #:  [id number]   ACCOUNT #:  [id number] DOB:  [birth year]    AGE:  82     SURGEON:  Arvil PersonsBrian D Jackson Fetters, MD  CO-SURGEON:    ASSISTANT:  First assistant is Jiles CrockerMeghan Kelly, MD.  SURGERY DATE:  02/26/2015    PREOPERATIVE DIAGNOSIS:  Right hip complex intertrochanteric fracture with subtrochanteric extension and involvement of the basicervical region of the right femoral neck.    POSTOPERATIVE DIAGNOSIS:  Right hip complex intertrochanteric fracture with subtrochanteric extension and involvement of the basicervical region of the right femoral neck.    OPERATIVE PROCEDURE:  Cephalomedullary nail fixation of peritrochanteric fracture with subtrochanteric extension.    ESTIMATED BLOOD LOSS:  Per anesthesia record.    URINE OUTPUT:  Per anesthesia record.    SPECIMENS:  None.    PATIENT'S CONDITION FOLLOWING PROCEDURE:  Stable to PACU.    IMPLANTS USED:  Synthes TFN 400 mm x 14 mm with 105 mm cephalic screw, 2 distal interlocking screws.    OPERATIVE INDICATIONS:  Jonathan Cisneros is a 82 year old gentleman who sustained a ground-level fall, resulting in inability to bear weight.  He was brought to [facility], where imaging was obtained, documenting the aforementioned bony pathology.  He was medically optimized and risk stratified by geriatric inpatient hospitalist service.  Orthopedics was consulted to comment on the nature of his musculoskeletal injury and provide definitive fracture stabilization and orthopedic care.  Risks, benefits, alternatives, and reasonable expectations of surgical and nonsurgical treatments were discussed with both the patient and his family.  Because of his high level of function, the unstable nature of his fracture, the desire for hygiene, comfort, mobility, pain control, and functional optimization, decision was made to proceed with operative stabilization/fixation of the fracture.    INTRAOPERATIVE FINDINGS:  Displaced intertrochanteric fracture of the right hip with  subtrochanteric extension involving the basicervical region of the right femoral neck.    DESCRIPTION OF PROCEDURE:  The patient was identified in the preanesthesia holding area, and the right hip was marked as the correct operative site by the attending surgeon.  Preoperative antibiotics were administered within 30 minutes on call to the operating room.  The patient was brought to the operating room, where he was placed supine on standard fracture table.  He underwent induction of general endotracheal anesthesia.  He was brought to the distal end of the table with the perineum against a well-padded perineal post.  All bony prominences were well padded.  Pneumatic compression device was placed on the left leg.  Left lower extremity was placed in a well leg holder, positioned in slight flexion, abduction, and external rotation to facilitate passage of large C-arm fluoroscope.  Right lower extremity was gently tractioned into a neutral position with the foot well padded in a traction device.  Provisional images were obtained documenting appropriate fracture realignment and reduction.  Decision was made to proceed with operative fixation of the fracture.  Right lower extremity was sterilely prepped and draped in the usual fashion for the procedure.  Before the procedure, a surgical pause was obtained, again identifying the right hip as the correct operative site.  A standard 3 cm longitudinal incision was made over the central portion of the greater trochanter.  Incision was carried to the deep investing fascial layer overlying the IT band.  This was split longitudinally in line with its fibers, facilitating exposure of the abductor mechanism.  This was bluntly separated to allow palpation of the  tip of the greater trochanter.  A guidewire was placed into the central portion of the greater trochanter, past centrally just to a point just distal to the lesser trochanter.  Biplanar C-arm fluoroscopy was utilized to ensure  adequacy of fracture reduction and passage of the guidewire.  Next, a 17 mm entry reamer was passed over the guidewire to a point just below the lesser trochanter.  A ball-tipped guidewire was then passed distally past the fracture site to the distal physeal scar, lower part of the femur.  The nail was sized.  Various-sized reamers were then passed over the guidewire to widen the aperture in the isthmus of the proximal femur.  An appropriate-sized nail was selected.  It was passed over the guidewire without difficulty.  It was seated in place.  Length and dimensions were deemed accurate.  Fracture remained acceptably reduced at all portions of nail passage.  Once the nail was seated and appropriately rotated, cephalic screw jig was applied to the distal end of the nail insertion arm.  A separate 2 cm longitudinal incision was made over the vastus ridge.  Incision was carried to the deep investing fascial layer overlying the IT band, which was split.  Blunt dissection facilitated exposure of the lateral cortex of the proximal femur.  A guidewire was passed into the central portion of the femoral neck and head.  Passage of the guidewire was again followed and deemed appropriate on biplanar C-arm fluoroscopy.  The wire was passed to a point within 1 cm of the femoral head.  Articular cartilage was viewed both in the AP and lateral planes.  Lateral cortical reamer and step reamer were then used to enlarge the femoral neck for eventual passage of the cephalic screw.  An appropriate-sized cephalic screw was selected and passed without difficulty over the guidewire.  Final images were obtained proximally.  The nail was statically locked.  Fracture remained reduced at all portions of fixation.  Attention was then turned to the distal aspect of the nail.  Perfect circles were obtained, and using a freehand method, 2 distal interlocking screws were placed to prevent rotation of the nail and to prevent rotation of the distal  fracture fragment distally.  Meticulous hemostasis was obtained at all incisions.  Wounds were copiously irrigated and closed with #1 Vicryl sutures for deep IT band layer.  Subcutaneous tissue was closed using 2-0 Vicryl suture.  Skin was closed using staples.  Sterile dressings were applied.  Overwrapped with sterile bandages and Tegaderm dressing.  The patient was awoken from anesthesia in stable condition, brought to the postanesthesia recovery room without complications.  All sharps and counts were correct at the conclusion of the case.  No intraoperative complications were noted.  I was present and scrubbed throughout all portions of the procedure.    ANESTHESIA:               ______________________________  Arvil Persons, MD    BDG/MODL  DD:  02/26/2015 10:30:39  DT:  02/26/2015 11:39:54  Job #:  [id number]    cc:

## 2015-02-26 NOTE — Comprehensive Assessment (Signed)
02/25/15 0000   Discharge Planning   Lives With Family   Can they assist with pt needs after discharge? Yes   *Does patient currently have home care services? No   *Current External Services None   Current Home Equipment Walker;Cane   SW Plan SW to follow (see discharge plan)   Interventions   Interventions Dual discharge plan initiated-SNF vs Home   Referrals and Recommendations   Referrals and Recommendations PT;OT   Rehab packet given to granddaughter.  Will discuss facility choices with family.  [facility]  Referral was made to them.  DOH and continuity of care forms placed in pt's chart.

## 2015-02-26 NOTE — Progress Notes (Signed)
Pt has not urinated since his surgery, a few attempts were made with no success. Pt was bladder scanned at over 58200ml's, Ortho was paged. Will continue to monitor. Ermalene Postinaitlin Breelle Hollywood, RN

## 2015-02-26 NOTE — Progress Notes (Signed)
Pt's BP low again at 85/50. Covering provider notified, ordered an additional 500ml NS bolus. Will administer and recheck BP after infusion complete. Yehuda BuddAlexis Seubert, RN

## 2015-02-26 NOTE — Anesthesia Procedure Notes (Signed)
----------------------------------------------------------------------------------------------------------------------------------------    Femoral Nerve Block    Date of Procedure: 02/26/2015 10:31 AM    Laterality:  Right    Injection Technique: Single-shot    Reason for Block: Post-op pain management and At Surgeon's request        Patient Location: Post-op  CONSENT AND TIMEOUT     Consent:  Obtained per policy  METHOD     Patient Position:  Supine    Monitoring:  Blood pressure and Continuous pulse oximetry    Sedation Used:  No    Level of Sedation: Awake              for meds injected see MAR portion of chart    Prep:  Aseptic technique per protocol and Chloraprep    Approach:  In-plane and Lateral    Technique: Ultrasound guided       Attempts:  1   NEEDLE     Type:  Short-bevel     Gauge: 22 G     Length: 5 cm  BLOCK EVENTS      No Paresthesia with needle     No Paresthesia with injection     No significant resistance to injection     No significant pain on injection     No blood aspirated     No intravascular injection     Well Tolerated  LOSS ASSESSED AS OF       Proprioception:  02/26/2015 10:32 AM      Sensory:  02/26/2015 10:32 AM  STAFF     Performed by: Attending Anesthesiologist personally performed    Attending Attestation: I personally performed the procedure     Attending: Micheil Klaus SUNG JIN  ----------------------------------------------------------------------------------------------------------------------------------------

## 2015-02-26 NOTE — Anesthesia Case Conclusion (Signed)
CASE CONCLUSION  Emergence  Actions:  Suctioned and extubated  Criteria Used for Airway Removal:  Adequate Tv & RR, acceptable O2 saturation and following commands  Assessment:  Routine  Transport  Directly to: PACU  Position:  Supine  Patient Condition on Handoff  Level of Consciousness:  Mildly sedated  Patient Condition:  Stable  Handoff Report to:  RN

## 2015-02-26 NOTE — Progress Notes (Signed)
Report given by Dr.Koh.  Pt. Is alert and oriented.  Pt. Is on 2.5 liters of oxygen via nasal cannula.  Pt. Reports no nausea and denies pain.  Dr.Koh did 2 types of block at bedside in the PACU.  Pedal pulse is found via doppler.  Blood is being given to the patient in PACU.  Will continue to monitor and then transfer to the floor.  Rojelio BrennerNicole Tarsha Blando, RN

## 2015-02-26 NOTE — Progress Notes (Signed)
Inpatient Geriatric Medicine Progress Note    Interval History:  Low bp overnight (90s) - got 500 cc bolus x2  Pt with no dizziness or lightheadedness  No sob, no chest pain  Pain in hip not too bad    Intake/Output    Intake/Output Summary (Last 24 hours) at 02/26/15 0804  Last data filed at 02/26/15 0659   Gross per 24 hour   Intake 2058.33 ml   Output   1150 ml   Net 908.33 ml        Vital Signs:   Temp:  [36.2 C (97.2 F)-37.1 C (98.8 F)] 36.8 C (98.2 F)  Heart Rate:  [75-92] 75  Resp:  [16-19] 16  BP: (78-129)/(48-69) 96/53       Physical Exam:    General:  No acute distress  HEENT:  MMM  Cardiovascular:  irreg rhythm normal rate  Pulmonary:  Lungs clear, no crackles  Gastrointestinal:  Soft, NT  Skin:    Data:      Recent Labs  Lab 02/26/15  0745 02/25/15  1943 02/25/15  1137   WBC  --   --  7.6   HEMOGLOBIN 9.2* 11.0* 12.7*   HEMATOCRIT 28* 34* 40   PLATELETS  --   --  273     Other Labs:      Recent Labs  Lab 02/25/15  1137   SODIUM 141   POTASSIUM 4.0   CHLORIDE 103   CO2 25   UN 18   CREATININE 0.84   GFR,CAUCASIAN 72   GFR,BLACK 83   GLUCOSE 110*   CALCIUM 8.9      X-rays the past 24 hours: * Femur Right Standard Ap And Lateral    Result Date: 02/25/2015  IMPRESSION:  Proximal right femoral fracture  END OF REPORT    * Hip Right Ap And Lateral Views    Result Date: 02/25/2015  IMPRESSION:  Proximal right femoral fracture  END OF REPORT       Assessment/Plan  Active Hospital Problems    Diagnosis    Closed displaced subtrochanteric fracture of right femur, initial encounter    HTN (hypertension)    A-fib    CAD (coronary artery disease)     S/p CABG      CHF (congestive heart failure)      Resolved Hospital Problems    Diagnosis   No resolved problems to display.     82 yo male admitted with R hip fx due to fall.    Hip fx -- plan for OR today  --usual pain meds    DVT prophy -- on eliquis at baseline  --would not restart eliquis for few days - hold until adequate  hemostasis  --would bridge with lmwh    Hypotension - likely from acute blood loss anemia -- hct down to 28 from 40  --would bolus again in pre-op  --consider transfusing intra-op  --monitor hcts post op at least bid  --will reduce lopressor  --holding valsartan and amlodipine    Cad - past cabg  --on bb  --not on asa    afib - rate controlled  --will decrease lorpessor to 25 bid due to low bp  --eliquis as above    CHF --chronic, likely diastolic but EF unknown  --holding home lasix  --maintain rate control    DC Planning  snf rehab vs home with services    D/w pt, family and Ortho housestaff     Olyn Landstrom E  Alois Mincer, MD   02/26/2015   8:04 AM

## 2015-02-26 NOTE — INTERIM OP NOTE (Signed)
Interim Op Note (Surgical Log ID: [id number])       Date of Surgery: 02/26/2015       Surgeons: Surgeon(s) and Role:     * Arvil PersonsGiordano, Brian D, MD - Primary     * Jiles CrockerKelly, Montrey Buist, MD - Resident - Assisting       Pre-op Diagnosis: Pre-Op Diagnosis Codes:     * Closed right hip fracture, initial encounter [S72.001A]       Post-op Diagnosis: Post-Op Diagnosis Codes:     * Closed right hip fracture, initial encounter [S72.001A]       Procedure(s) Performed: Procedures:    * FEMUR IM RODDING TFN       Additional CPT Codes:        Anesthesia Type: General        Fluid Totals: I/O this shift:  01/15 0700 - 01/15 1459  In: 1100 (18.7 mL/kg) [I.V.:1100]  Out: 50 (0.8 mL/kg) [Blood:50]  Net: 1050  Weight: 59 kg        Estimated Blood Loss: Blood Loss: 50 mL       Specimens to Pathology:  * No specimens in log *       Temporary Implants:        Packing:                 Patient Condition: good       Findings (Including unexpected complications): none     Signed:  Jiles CrockerMeghan Kirandeep Fariss, MD  on 02/26/2015 at 10:48 AM

## 2015-02-26 NOTE — Plan of Care (Signed)
Problem: Safety  Goal: Patient will remain free of falls  Outcome: Maintaining    Problem: Pain/Comfort  Goal: Patients pain or discomfort is manageable  Outcome: Progressing towards goal    Problem: Nutrition  Goal: Nutritional status is maintained or improved - Geriatric  Outcome: Maintaining    Problem: Mobility  Goal: Functional status is maintained or improved - Geriatric  Outcome: Maintaining    Problem: Psychosocial  Goal: Demonstrates ability to cope with illness  Outcome: Completed or Resolved Date Met:  02/26/15

## 2015-02-26 NOTE — Comprehensive Assessment (Signed)
02/26/15 1532   Demographics   Religious/Cultural Factors None   [place]   Marital status Widowed   Ethnicity/Race Caucasian   Is patient a primary care giver? No   Primary Language English   Primary Care Taker of? No one   Living Status With family   Living Situation   Lives With Family   Home Geography   [facility]   # Of Steps In Home 0   # Steps to Enter Home 1   Bedroom First floor   Bathroom First floor - full   Utilitites Working Yes   Baseline ADL functioning   Transfers Independent   Ambulation With Midwifeassistance   Assistive Device Cane;Walker   Bathing/Grooming Independent   Meal Prep Independent   Able to feed self? Yes   Household maintenance/chores Independent   Income Information   Vocational Retired   Radiographer, therapeuticncome Situation SSI   Insurance Information Medicare   Pharmacy Used [facility] [address]   [employer] a [employer] Veteran   Home Care Services   Do you currently have home care services? No   Current home equipment available Cane;Walker   SW Home oxygen   Do you have home oxygen? No   Assessment completed with pt's granddaughter [name](Marlo Dimperio cell [phone]) and SO.  [place] ago [place]  Pt resides with family in ranch style home.

## 2015-02-27 LAB — BASIC METABOLIC PANEL
Anion Gap: 11 (ref 7–16)
CO2: 22 mmol/L (ref 20–28)
Calcium: 8.4 mg/dL — ABNORMAL LOW (ref 8.6–10.2)
Chloride: 104 mmol/L (ref 96–108)
Creatinine: 1.24 mg/dL — ABNORMAL HIGH (ref 0.67–1.17)
GFR,Black: 55 * — AB
GFR,Caucasian: 47 * — AB
Glucose: 138 mg/dL — ABNORMAL HIGH (ref 60–99)
Lab: 27 mg/dL — ABNORMAL HIGH (ref 6–20)
Potassium: 4 mmol/L (ref 3.3–5.1)
Sodium: 137 mmol/L (ref 133–145)

## 2015-02-27 LAB — RED BLOOD CELLS
Coded Blood type: 5100
Component blood type: O POS
Dispense status: TRANSFUSED

## 2015-02-27 LAB — HCT AND HGB
Hematocrit: 28 % — ABNORMAL LOW (ref 40–51)
Hemoglobin: 8.8 g/dL — ABNORMAL LOW (ref 13.7–17.5)

## 2015-02-27 LAB — MCHC: MCHC: 32 g/dL (ref 32–37)

## 2015-02-27 MED ORDER — VITAMIN D 2000 UNIT PO TABS *I*
2000.0000 [IU] | ORAL_TABLET | Freq: Every day | ORAL | Status: DC
Start: 2015-02-27 — End: 2016-06-18

## 2015-02-27 MED ORDER — POLYETHYLENE GLYCOL 3350 PO PACK 17 GM *I*
17.0000 g | PACK | Freq: Two times a day (BID) | ORAL | Status: DC
Start: 2015-02-27 — End: 2015-10-06

## 2015-02-27 MED ORDER — BISACODYL 10 MG RE SUPP *I*
10.0000 mg | Freq: Every day | RECTAL | Status: AC
Start: 2015-02-27 — End: 2015-03-01

## 2015-02-27 MED ORDER — DOCUSATE SODIUM 100 MG PO CAPS *I*
200.0000 mg | ORAL_CAPSULE | Freq: Two times a day (BID) | ORAL | Status: AC
Start: 2015-02-27 — End: ?

## 2015-02-27 MED ORDER — CALCIUM CITRATE-VITAMIN D 315-250 MG-UNIT PO TABS *I*
1.0000 | ORAL_TABLET | Freq: Two times a day (BID) | ORAL | Status: DC
Start: 2015-02-27 — End: 2016-06-18

## 2015-02-27 MED ORDER — POLYETHYLENE GLYCOL 3350 PO PACK 17 GM *I*
17.0000 g | PACK | Freq: Two times a day (BID) | ORAL | Status: DC
Start: 2015-02-27 — End: 2015-03-03
  Administered 2015-02-27 – 2015-03-03 (×8): 17 g via ORAL
  Filled 2015-02-27 (×8): qty 17

## 2015-02-27 MED ORDER — SENNOSIDES 8.6 MG PO TABS *I*
2.0000 | ORAL_TABLET | Freq: Every day | ORAL | Status: DC
Start: 2015-02-27 — End: 2015-10-06

## 2015-02-27 MED ORDER — KETOROLAC TROMETHAMINE 30 MG/ML IJ SOLN *I*
15.0000 mg | Freq: Four times a day (QID) | INTRAMUSCULAR | Status: AC
Start: 2015-02-27 — End: 2015-02-27
  Administered 2015-02-27 (×2): 15 mg via INTRAVENOUS
  Filled 2015-02-27 (×2): qty 1

## 2015-02-27 MED ORDER — DOCUSATE SODIUM 100 MG PO CAPS *I*
200.0000 mg | ORAL_CAPSULE | Freq: Two times a day (BID) | ORAL | Status: DC
Start: 2015-02-27 — End: 2015-03-03
  Administered 2015-02-27 – 2015-03-03 (×8): 200 mg via ORAL
  Filled 2015-02-27 (×9): qty 2

## 2015-02-27 MED ORDER — SODIUM CHLORIDE 0.9 % IV BOLUS *I*
500.0000 mL | Freq: Once | Status: AC
Start: 2015-02-27 — End: 2015-02-27
  Administered 2015-02-27: 500 mL via INTRAVENOUS

## 2015-02-27 MED ORDER — BISACODYL 10 MG RE SUPP *I*
10.0000 mg | Freq: Every day | RECTAL | Status: DC
Start: 2015-02-27 — End: 2015-04-21

## 2015-02-27 MED ORDER — ACETAMINOPHEN 500 MG PO TABS *I*
1000.0000 mg | ORAL_TABLET | Freq: Three times a day (TID) | ORAL | Status: AC | PRN
Start: 2015-02-27 — End: ?

## 2015-02-27 NOTE — Plan of Care (Signed)
Urinary retention, but mild hypotension, will Increased bowel regime for potential improvement.   Jonathan MulletAbby J Taven Strite, PA

## 2015-02-27 NOTE — Continuity of Care (Signed)
NEW [facility] DEPARTMENT OF HEALTH  [facility]-Division of Quality and Surveillance for Nursing Homes and ICFs/MR  Patient Name  Jonathan Cisneros MR Number  [id number] Account Number  [id number] [facility]  [id number]    Hospital and Community                Patient Review Instrument  (HC-PRI)               RUG II:  RB5     I. ADMINISTRATIVE DATA     1. Operating Certificate Number  [id number] 2. Social Security Number  [id number]   3. Official Name of Hospital Completing this Review  [facility]    4A. Patient Name  Jonathan Cisneros 10. Sex  male   4B. [place] Residence  [place] 11A. Date of Hospital Admission or Initial Agency Visit  02/25/2015   5. Date of [facility] Completion  February 27, 2015 11B. Date of Alternate level of Care Status in Hospital (if applicable)    6. Account Number  [id number] 12. Medicaid Number  MEDICARE,    MCR,    [id number] A  MEDICARE 1500,    MCR,    [id number] A  BLUE CROSS,    BCBS,    [id number]       7. Hospital Room Number  W621/W621-02 13. Medicare Number     8. Name of Unit/Division/Building  W621/W621-02 14. Primary Payor     9. Date of Birth  [phone] 59. Reason for [facility] Completion  1 - RHCF application from hospital     II. MEDICAL EVENTS     16. Decubitus Level:        Location:  No reddened skin or breakdown     17. MEDICAL CONDITIONS During the past week:  1=Yes  2=No 18. MEDICAL TREATMENTS 1=Yes  2=No   A. Comatose No A. Tracheostomy Care/Suct No         B. Dehydration No B. Suctioning/General No         C. Internal Bleeding No C. Oxygen (Daily) No              D. Stasis Ulcer No D Respiratory Care (Daily) No         E. Terminally Ill No E. Nasal Gastric Feeding No         F. Contractures No F. Parenteral Feeding No         G. Diabetes Mellitus No G. Wound Care No         H. Urinary Tract Infection No H. Chemotherapy No         I. HIV Infection Symptomatic No I. Transfusion No         J. Accident Yes (Fall,  R  Hip  Fx) J. Dialysis No         K. Ventilator Dependent No K. Bowel and Bladder Rehab No            L. Catheter (Indwelling or External) No              M. Physical Restraints  (Daytime) No             Adapted from DOH-[id number]    Version: 002F  Review Type: Update review  02/27/2015  2 of 7    NEW [facility] DEPARTMENT OF HEALTH  [facility]-Division of Quality and Surveillance for Nursing Homes and ICFs/MR  Patient Name  Jonathan Cisneros MR Number  [id number] Account Number  [id number] Birthdate  [id number]  Hospital and Community                Patient Review Instrument  (HC-PRI)               III. Activities of Daily Living     19. Eating 1-Feeds self without supervision or physical assistance.  May use adaptive equipment.    20. Mobility 5-Is wheeled, chairfast or bedfast. Relies on someone else to move about, if at all.    21. Transfer 3-Requires one person to provide constant guidance, steadiness and/or physical assistance.  Patient may participate in transfer.    22. Toileting 3-Continent of bowel and bladder.  Requires constant supervision and/or physical assistance with major/all parts of the task, including appliances (i.e., colostomy, ilieostomy, urinary catheter).      IV. BEHAVIORS     23. Verbal Disruption No known history   24. Physical Agression No known history   25. Disruptive, Infantile or Socially Inappropriate Behavior No known history   26. Hallucinations No     V. SPECIALIZED SERVICES     27A. Physical Therapy  27B. Occupational Therapy    Level: Restorative therapy- requires and is currently receiving physical and/or occupational therapy for the past week Level: Does not receive.   Actual days/week:2 Yes - equal or greater than 5 times/week Actual days/week::      Actual hours/week:0.4 Yes - equal to or greater than 2.5 hrs/wk Actual hours/week:        28. Number of Physician Visits - 0     VI. DIAGNOSIS     29. Primary Problem:        R  Hip  Fx,   s/p  R  IMN     VII. PLAN OF CARE SUMMARY     30. Primary Diagnosis:         Fall,  R  Hip  Fx,   s/p  R  IMN  02/26/15,  Urinary Retention         PMH:  See below        AFib      CAD    CHF   HTN         PSH:  has a past surgical history that includes Appendectomy; Cataract removal with implant (Bilateral); Coronary artery bypass graft (02/13/2003); and orthopedic surgery.   31A. Rehabilitation Potential: Treatment Interventions: Restorative PT  PT Frequency: 5-7x/wk, 30 minute sessions  Hospital Stay Recommendations: ROM daily, SW  Discharge Recommendations: Skilled Nursing Facility Rehab  PT Discharge Equipment Recommended: None  Assessment/Recommendations Reviewed With:: Nursing, Patient, Family, Child psychotherapist.    OT Frequency: 3-5x/wk  Patient Will Benefit From: ADL retraining, Functional transfer training, UE strengthening/ROM, Endurance training, Patient/Family training, Compensatory technique education, Exercises.    OT Discharge Recommendations: Skilled Nursing [facility] Stay Recommendations: Encourage OOB and independence with ADL's. two assist SPT to bedside commode or use hand held urinal/bed pan.            Comment:    31B. Current therapy care plan: Treatment Interventions: Restorative PT  PT Frequency: 5-7x/wk, 30 minute sessions  Hospital Stay Recommendations: ROM daily, SW  Discharge Recommendations: Skilled Nursing Facility Rehab  PT Discharge Equipment Recommended: None  Assessment/Recommendations Reviewed With:: Nursing, Patient, Family, Social Worker            Comment:    32. Medications: See below   33A. Allergies: Penicillins   33B: Treatments:  WBAT  RLE      33C: Abnormal Labs: See below   33D: Precautions:     Fall    Aspiration             Comment:    33E: Pacemaker     11F: Diet: Diet geriatric          34. Race/Ethnic Group White or Caucasian [1]     35. Certification    I have personally observed/interviewed this patient and completed this H/C PRI. No - Staff/Chart           I certify that the information contained herein is a true abstract of this patient's condition and medical record. Yes   Certified  Assessor: Jordan Likes, RN   Identification Number:   [id number]             Adapted from [id number]    Version: 002F  Review Type: Update review  02/27/2015  2 of 1    PMH:    Past Medical History   Diagnosis Date    Arthritis      osteoarthritis    Atrial fibrillation     Atrial flutter     CHF (congestive heart failure)      chronic systolic heart failure, LVEF 30% on echo 03/10/2013    CHF (congestive heart failure)     Coronary artery disease      LM and 3 vessel, s/p CABG 02/13/2003    Hyperlipidemia     Hypertension     LBBB (left bundle branch block)        Medications:    Current Facility-Administered Medications   Medication Dose Route Frequency    polyethylene glycol (GLYCOLAX/MIRALAX) powder 17 g  17 g Oral 2 times per day    docusate sodium (COLACE) capsule 200 mg  200 mg Oral 2 times per day    bisacodyl (DULCOLAX) suppository 10 mg  10 mg Rectal Daily    metoprolol (LOPRESSOR) tablet 25 mg  25 mg Oral 2 times per day    sodium chloride 0.9 % IV  125 mL/hr Intravenous Continuous    senna (SENOKOT) tablet 2 tablet  2 tablet Oral Daily    bisacodyl (DULCOLAX) suppository 10 mg  10 mg Rectal Daily PRN    enoxaparin (LOVENOX) injection 40 mg  40 mg Subcutaneous Q24H    calcium citrate-vitamin D (CITRACAL+D) 315-250 MG-UNIT per tablet 1 tablet  1 tablet Oral BID    cholecalciferol (VITAMIN D) tablet TABS 2,000 Units  2,000 Units Oral Daily    acetaminophen (TYLENOL) tablet 1,000 mg  1,000 mg Oral 3 times per day    haloperidol (HALDOL) tablet 0.5 mg  0.5 mg Oral Q6H PRN    ondansetron (ZOFRAN) injection 4 mg  4 mg Intravenous Q6H PRN    oxyCODONE (ROXICODONE) IR tablet 2.5 mg  2.5 mg Oral Q2H PRN    Or    oxyCODONE (ROXICODONE) IR tablet 5 mg  5 mg Oral Q2H PRN    morphine 2 mg/mL injection 2 mg  2 mg Intravenous Q2H PRN    Or    morphine 4 MG/ML injection 4 mg  4 mg Intravenous Q2H PRN    nalbuphine (NUBAIN) injection 3 mg  3 mg Intravenous Q3H PRN    camphor-menthol (SARNA) lotion    Topical PRN    atorvastatin (LIPITOR) tablet 20 mg  20 mg Oral Daily    calcium carbonate (TUMS) chewable tablet 500 mg  500 mg  Oral 2 times per day       Abnormal Labs:    Recent Results (from the past 72 hour(s))   CBC and differential    Collection Time: 02/25/15 11:37 AM   Result Value Ref Range    WBC 7.6 4.2 - 9.1 THOU/uL    RBC 4.1 (L) 4.6 - 6.1 MIL/uL    Hemoglobin 12.7 (L) 13.7 - 17.5 g/dL    Hematocrit 40 40 - 51 %    MCV 97 (H) 79 - 92 fL    MCH 31 26 - 32 pg    MCHC 32 32 - 37 g/dL    RDW 47.8 29.5 - 62.1 %    Platelets 273 150 - 330 THOU/uL    Seg Neut % 66.2 %    Lymphocyte % 21.1 %    Monocyte % 9.9 %    Eosinophil % 1.6 %    Basophil % 0.8 %    Neut # K/uL 5.0 1.8 - 5.4 THOU/uL    Lymph # K/uL 1.6 1.3 - 3.6 THOU/uL    Mono # K/uL 0.8 0.3 - 0.8 THOU/uL    Eos # K/uL 0.1 0.0 - 0.5 THOU/uL    Baso # K/uL 0.1 0.0 - 0.1 THOU/uL    Nucl RBC % 0.0 0.0 - 0.2 /100 WBC    Nucl RBC # K/uL 0.0 0.0 - 0.0 THOU/uL    IMM Granulocytes # 0.0 0.0 - 0.1 THOU/uL    IMM Granulocytes 0.4 %   Basic metabolic panel    Collection Time: 02/25/15 11:37 AM   Result Value Ref Range    Glucose 110 (H) 60 - 99 mg/dL    Sodium 308 657 - 846 mmol/L    Potassium 4.0 3.3 - 5.1 mmol/L    Chloride 103 96 - 108 mmol/L    CO2 25 20 - 28 mmol/L    Anion Gap 13 7 - 16    UN 18 6 - 20 mg/dL    Creatinine 9.62 9.52 - 1.17 mg/dL    GFR,Caucasian 72 *    GFR,Black 83 *    Calcium 8.9 8.6 - 10.2 mg/dL   Hold blue    Collection Time: 02/25/15 11:37 AM   Result Value Ref Range    Hold Blue HOLD TUBE    Hold green with gel    Collection Time: 02/25/15 11:37 AM   Result Value Ref Range    Hold Green (w/gel,spun) HOLD TUBE    PTH, intact    Collection Time: 02/25/15 11:37 AM   Result Value Ref Range    Intact PTH 61.0 15.0 - 65.0 pg/mL   TSH    Collection Time: 02/25/15 11:37 AM   Result Value Ref Range    TSH 2.26 0.27 - 4.20 uIU/mL   Protime-INR    Collection Time: 02/25/15 11:37 AM   Result Value Ref Range    Protime 16.5 (H) 10.0 - 12.9 sec     INR 1.4 (H) 0.9 - 1.1   Albumin    Collection Time: 02/25/15 11:37 AM   Result Value Ref Range    Albumin 3.8 3.5 - 5.2 g/dL   Type and screen    Collection Time: 02/25/15  7:43 PM   Result Value Ref Range    ABO RH Blood Type O RH POS     Antibody Screen Negative    HCT and Hgb    Collection Time: 02/25/15  7:43 PM   Result Value  Ref Range    Hemoglobin 11.0 (L) 13.7 - 17.5 g/dL    Hematocrit 34 (L) 40 - 51 %   MCHC    Collection Time: 02/25/15  7:43 PM   Result Value Ref Range    MCHC 32 32 - 37 g/dL   HCT and Hgb    Collection Time: 02/26/15  7:45 AM   Result Value Ref Range    Hemoglobin 9.2 (L) 13.7 - 17.5 g/dL    Hematocrit 28 (L) 40 - 51 %   MCHC    Collection Time: 02/26/15  7:45 AM   Result Value Ref Range    MCHC 33 32 - 37 g/dL   Red blood cells    Collection Time: 02/26/15  8:29 AM   Result Value Ref Range    Component blood type O Pos     Unit Number [id number]     Dispense status Released     Product code [id number]     Blood product type Red Blood Cells     Coding system [id number]     Coded Blood type 5100    Red blood cells    Collection Time: 02/26/15  8:59 AM   Result Value Ref Range    Component blood type O Pos     Unit Number [id number]     Dispense status Transfused     Product code [id number]     Blood product type Red Blood Cells     Coding system [id number]     Coded Blood type 5100    Type and screen    Collection Time: 02/26/15  9:20 AM   Result Value Ref Range    ABO RH Blood Type O RH POS     Antibody Screen Negative    Hematocrit    Collection Time: 02/26/15  1:27 PM   Result Value Ref Range    Hematocrit 33 (L) 40 - 51 %   MCHC    Collection Time: 02/26/15  1:27 PM   Result Value Ref Range    MCHC 32 32 - 37 g/dL   Basic metabolic panel    Collection Time: 02/26/15  5:04 PM   Result Value Ref Range    Glucose 148 (H) 60 - 99 mg/dL    Sodium 413141 244133 - 010145 mmol/L    Potassium 3.9 3.3 - 5.1 mmol/L    Chloride 107 96 - 108 mmol/L    CO2 23 20 - 28 mmol/L    Anion Gap 11 7 - 16    UN 17 6 - 20  mg/dL    Creatinine 2.720.78 5.360.67 - 1.17 mg/dL    GFR,Caucasian 74 *    GFR,Black 86 *    Calcium 8.2 (L) 8.6 - 10.2 mg/dL   HCT and Hgb    Collection Time: 02/26/15  5:04 PM   Result Value Ref Range    Hemoglobin 10.5 (L) 13.7 - 17.5 g/dL    Hematocrit 32 (L) 40 - 51 %   MCHC    Collection Time: 02/26/15  5:04 PM   Result Value Ref Range    MCHC 33 32 - 37 g/dL

## 2015-02-27 NOTE — Progress Notes (Signed)
Pt had not voided in 6 hrs since straight cath. Bladder scanned for 375 mL. Notified provider. Instructed to allow patient more time to try to void on his own. Will continue to monitor and re-bladder scan in 1-2 hours if patient has not voided.  Yehuda BuddAlexis Seubert, RN

## 2015-02-27 NOTE — Progress Notes (Signed)
Physical Therapy    Initial Eval Completed.    Past Medical History   Diagnosis Date    Arthritis      osteoarthritis    Atrial fibrillation     Atrial flutter     CHF (congestive heart failure)      chronic systolic heart failure, LVEF 30% on echo 03/10/2013    CHF (congestive heart failure)     Coronary artery disease      LM and 3 vessel, s/p CABG 02/13/2003    Hyperlipidemia     Hypertension     LBBB (left bundle branch block)        Past Surgical History   Procedure Laterality Date    Appendectomy       in his 30's    Cataract removal with implant Bilateral     Coronary artery bypass graft  02/13/2003     LIMA to LAD, SVG to OM 1-2, SVG to RCA    Orthopedic surgery       traumatic injuries; rod in rt tibia; ?plate in l femor (separate accidents)       *Bold Indicates co-morbidities affecting treatment and recovery    Comorbidities affecting treatment/recovery in addition to those listed above:  Surgeries: R IMN 02/26/15    Personal factors affecting treatment/recovery:   Needs assistance for mobility    Patient complexity:  moderate level as indicated by above personal factors, environmental factors and comorbidities in addition to their impairments found on physical exam.       02/27/15 1000   Prior Living    Prior Living Situation Reported by patient;Reported by family;Obtained via chart   Lives With Limited BrandsFamily   Receives Help From Family   Type of Home [place] Home   # Steps to Enter Home 2   Additional Comments Pt lives with grand-daughter in ranch home with 2 steps to enter.  Pt was independent with mobility with quad cane in home and supervision with RW out of home and on stairs.  Pt continues to be very active using stationary bike daily and home exercise program.   PT Tracking   PT TRACKING PT Coverage   Visit Number   Visit Number 1   Precautions/Observations   Precautions used Yes   Weight Bearing Status RLE WBAT   LDA Observation None   Fall Precautions Bed alarm engaged   Pain Assessment   *Is  the patient currently in pain? Denies   Pain Intervention(s) Cold applied;Repositioned   Additional comments denies pain before, during and after session, reporting "tightness" in hip   Vision    Current Vision No visual deficits   Cognition   Cognition No deficit noted   UE Assessment   UE Assessment (grossly WFL)   LE Assessment   LE Assessment Full AROM LLE  (right limited at hip and knee but [facility])   Bed Mobility   Bed mobility Tested   Supine to Sit Minimum assist ;1 person assist;Head of bed elevated;Side rails up (#)   Sit to Supine 2 person assist;Moderate assist ;Head of bed flat;Side rails down   Additional comments 2A to boost into bed   Transfers   Transfers Tested   Sit to Stand Minimum ;1 person assist;Verbal cues   Stand to sit Minimum ;1 person assist;Verbal cues   Transfer Assistive Device rolling walker   Additional comments sit<>stand from edge of bed to RW, pt stood x 2 min, complain of dizziness; pt then had seated  rest break x3 min and sit<>stand performed again, attempted to take step forward and back and sideways to right, pt unable to lift LLE off floor to perform step   Mobility   Mobility Not tested (comment)   Additional comments see note above   Balance   Sitting - Static Supported  (SBA)   Sitting - Dynamic Supported  (SBA)   Standing - Static Contact guard;Supported   Additional Comments   Additional comments Discussed discharge plan with pt and daughter with recommendation for rehab.  Pt and daughter in agreement with plan.   Assessment   Brief Assessment Appropriate for skilled therapy   Problem List Impaired endurance;Impaired transfers;Impaired ambulation;Impaired mobility;Impaired bed mobility;Impaired stair navigation;Impaired functional mobility   Patient / Family Goal prefers discharge home but okay with rehab   Plan/Recommendation   Treatment Interventions Restorative PT;Bed mobility training;AROM;Stair training;Pt/Family education;Gait training;Strengthening;D/C  planning;Will work to minimize pain while promoting mobility whenever possible   PT Frequency 5-7x/wk;30 minute sessions   Hospital Stay Recommendations ROM daily;SW   Discharge Recommendations Skilled Nursing Facility Rehab   PT Discharge Equipment Recommended None   Assessment/Recommendations Reviewed With: Nursing;Patient;Family;Social Worker   Time Calculation   PT Untimed Codes 23   PT Total Treatment 23   PT Charges   $HH PT Charges PT Eval Mod Complexity - code 7162     Angelina Sheriff PT

## 2015-02-27 NOTE — Progress Notes (Signed)
Occupational Therapy     Initial Eval Completed.  Pt is a 82 y/o male who sustained a fall at home resulting in R hip fx. Pt is s/p R IMN POD #1.  Recommended d/c location- SNF Rehab    Past Medical History   Diagnosis Date    Arthritis      osteoarthritis    Atrial fibrillation     Atrial flutter     CHF (congestive heart failure)      chronic systolic heart failure, LVEF 30% on echo 03/10/2013    CHF (congestive heart failure)     Coronary artery disease      LM and 3 vessel, s/p CABG 02/13/2003    Hyperlipidemia     Hypertension     LBBB (left bundle branch block)        Past Surgical History   Procedure Laterality Date    Appendectomy       in his 30's    Cataract removal with implant Bilateral     Coronary artery bypass graft  02/13/2003     LIMA to LAD, SVG to OM 1-2, SVG to RCA    Orthopedic surgery       traumatic injuries; rod in rt tibia; ?plate in l femor (separate accidents)       *Bold Indicates co-morbidities affecting treatment and recovery    Occupational profile relating to the present problem:   Needs assistance for mobility   Pt lives with supportive family in a one level home.    Prior to admission pt was very active and independent with self care using adaptive device.    In addition to the PMH and surgical history listed above, the comorbidities affecting treatment/recovery:   Joint pain: R Leg   Surgeries: Recent R imn      Performance deficits that result in activity limitations and/or performance restrictions: Balance, Mobility, Activity tolerance, Vision, Problem solving.      Modification of tasks needed or assistance with assessments necessary to enable completion of evaluation component:    Patient complexity:  moderate level as indicated by  personal factors, environmental factors and comorbidities in addition to their impairments found on physical exam.       02/27/15 1217   OT Tracking   OT Tracking OT Assigned   OT Last Visit   Visit (#) of Five 1   Precautions    Precautions used Yes   Weight Bearing Status WBAT;RLE  (s/p R IMN POD #1)   Fall Precautions General falls precautions;Bed alarm engaged   LDA Observation None   Other family present at beginning of eval   Home Living (Prior to Admission)   Prior Living Situation Reported by patient;Obtained via chart review   Type of Home [facility]   Bedroom First floor   Bathroom First floor   # Steps to [employer] Home 2   Home Equipment Rolling Walker  (Quad cane)   Prior Function   Prior Function Reported by patient;Obtained during chart review   Level of Independence Independent with assistive device;Independent with ADLs and functional transfers   Lives With [facility] Help From Independent;Family   Additional Comments Pt reports he used compensatory strategy to donn socks- used his quad cane.   Pain Assessment   *Is the patient currently in pain? Yes   Pain Location 1 Leg   Orientation of Pain 1 Right   Pain Scale 1 8  ("tight")   Pain 1 During;After  Pain Intervention(s) 1 Cold applied;Repositioned;Refer to nursing for pain management   Vision    Current Vision Visual deficits   Additional Comments Pt reports he is legally blind and seems some colors an outlined of items.    Cognition   Level of Alertness Alert   Orientation Oriented to person;Oriented to place;Oriented to situation   Attention  Intact focal   Memory Appears intact   Following Commands One step simple;Two step simple   Additional Comments At one point during evaluation pt had a moment where he thought he was in his room at home but quickly corrected himself.   UE Assessment   Additional Comments B UE WFL.   Bed Mobility   Supine to Sit Minimal Assist;One person assist   Sit to Supine Moderate Assist;One person assist   Additional Comments When initially sat up pt report slight dizziness which resolved with rest and deep breathing. Two assist to boost up in bed.   Functional Transfers   Sit to Stand Minimal Assist;One person assist   Stand to Sit  Minimal Assist;One person assist   Additional Comments Pt tolerated standing at EOB to manage pulling up briefs and to take a couple of steps to the R towards the [facility] using RW, vebral cues and MIN A for balance. Pt with no c/o of dizziness during this.   Balance   Sitting - Static Supervision;Unsupported   Sitting - Dynamic Supervision;Contact Guard   Standing - Static Minimal Assist;Supported   Standing Tolerance during Functional Task Fair-   ADL Assessment   UE Dressing Set up   LE Dressing Moderate Assist;Maximum Assist   Where  LE Dressing Assessed Edge of bed   Assist Needed With: Supervision;Setup;Decreased balance;Pants over feet;Increased time;Socks;Sequencing   Equipment Used Walker  (quad cane per pt preference. )   Activity Tolerance   Endurance Tolerates 30 min activity with multiple rests   Assessment   Assessment Impaired ADL status;Impaired balance;Impaired endurance;Impaired self-care transfers;Visual deficit   Plan   OT Frequency 3-5x/wk   Patient Will Benefit From ADL retraining;Functional transfer training;UE strengthening/ROM;Endurance training;Patient/Family training;Compensatory technique education;Exercises   Multidisciplinary Communication   Multidisciplinary Communication RN, PT, pt   Recommendation   [facility]   Hospital Stay Recommendations Encourage OOB and independence with ADL's. two assist SPT to bedside commode or use hand held urinal/bed pan.   Time Calculation   OT Untimed Codes 32   OT Unbilled Time 0   OT Total Treatment 32   OT Charges   $HH OT Charges Eval Mod Complex (45 min)     Wendi SnipesStephanie Lenoir Facchini MS,OTR/L  Pager [phone]

## 2015-02-27 NOTE — Plan of Care (Signed)
Problem: Impaired Bed Mobility  Goal: STG - IMPROVE BED MOBILITY  Patient will perform bed mobility with rails and the head of bed up with Contact guard assist of 1     Time frame: 3-5 days    Problem: Impaired Transfers  Goal: STG - IMPROVE TRANSFERS  Patient will complete Sit to stand transfers using a rolling walker with Contact guard assist of 1     Time frame: 3-5 days    Problem: Impaired Ambulation  Goal: STG - IMPROVE AMBULATION  Patient will ambulate less than 50 feet using a rolling walker with Minimal assistof 2     Time frame: 3-5 days

## 2015-02-27 NOTE — Anesthesia Postprocedure Evaluation (Signed)
Anesthesia Post-Op Note    Patient: Jonathan Cisneros    Procedure(s) Performed:  Procedure Summary     Date Anesthesia Start Anesthesia Stop Room / Location    02/26/15 0824 1025 [id number] / HH MAIN OR       Procedure Diagnosis Surgeon Attending Anesthesia    FEMUR IM RODDING TFN (Right ) Closed right hip fracture, initial encounter  (Closed right hip fracture, initial encounter [S72.001A]) Arvil PersonsGiordano, Brian D, MD Buckner MaltaKoh, Tarini Carrier Sung Jin, DO        Recovery Vitals  BP: 98/50 (02/27/2015  9:03 AM)  Heart Rate: 98 (02/27/2015  6:31 AM)  Heart Rate (via Pulse Ox): 63 (02/26/2015 11:24 AM)  Resp: 15 (02/27/2015  6:31 AM)  Temp: 36.9 C (98.4 F) (02/27/2015  6:31 AM)  SpO2: 91 % (02/27/2015  6:31 AM)  O2 Device: None (Room air) (02/27/2015  6:31 AM)  O2 Flow Rate: 1 L/min (02/26/2015 10:10 PM)   0-10 Scale: 4 (02/27/2015  7:45 AM)  Anesthesia type:  General  Complications Noted During Procedure or in PACU:  None   Comment:    Patient Location:  Med Surgical Floor  Level of Consciousness:    Recovered to baseline  Patient Participation:     Able to participate  Temperature Status:    Normothermic  Oxygen Saturation:    Within patient's normal range  Cardiac Status:   Within patient's normal range  Fluid Status:    Stable  Airway Patency:     Yes  Pulmonary Status:    Baseline  Neuraxial Block Evaluation:    No residual motor or sensory symptoms  Pain Management:    Adequate analgesia  Nausea and Vomiting:  None    Post Op Assessment:    Tolerated procedure well   Attending Attestation:  All indicated post anesthesia care provided     -

## 2015-02-27 NOTE — Progress Notes (Addendum)
Orthopaedic Surgery Progress Note  1 Day Post-Op    Patient: Jonathan Cisneros   MRN: [id number]   DOA: 02/25/2015   Attending: Arvil PersonsGiordano, Jerimie Mancuso D, MD    Subjective: NAE overnight, straight cath for retention. Pain well controlled except for during spasms that occurred 4-5 times overnight. Denies F/C/N/V/CP/SOB. Denies numbness/tingling.   No BM, +flatus.    Objective:  Temp:  [35.5 C (95.9 F)-36.9 C (98.4 F)] 36.9 C (98.4 F)  Heart Rate:  [63-98] 98  Resp:  [15-30] 15  BP: (90-109)/(41-71) 103/52  Vitals:    02/27/15 0631   BP: 103/52   Pulse: 98   Resp: 15   Temp: 36.9 C (98.4 F)   Weight:    Height:        Intake/Output Summary (Last 24 hours) at 02/27/15 0648  Last data filed at 02/27/15 0631   Gross per 24 hour   Intake 4945.41 ml   Output   1125 ml   Net 3820.41 ml       Recent Labs  Lab 02/26/15  1704 02/26/15  1327 02/26/15  0745 02/25/15  1943 02/25/15  1137   WBC  --   --   --   --  7.6   HEMOGLOBIN 10.5*  --  9.2* 11.0* 12.7*   HEMATOCRIT 32* 33* 28* 34* 40   PLATELETS  --   --   --   --  273       Recent Labs  Lab 02/26/15  1704 02/25/15  1137   SODIUM 141 141   POTASSIUM 3.9 4.0   CO2 23 25   UN 17 18   CREATININE 0.78 0.84   GLUCOSE 148* 110*   CALCIUM 8.2* 8.9       Recent Labs  Lab 02/25/15  1137   INR 1.4*   PROTIME 16.5*       No components found with this basename: APTT    Physical Exam:  AOx3, NAD, supine in bed  RRR, Unlabored breathing  Abdomen firm, non tender    RLE:   Hip dressing clean, dry, intact  Motor: + ankle DF/PF, toe flex/ext  Sensory: SILT 1st dorsal web space/medial/lateral/plantar/dorsal foot  Ciculation: 2+ DP/PT pulses, toes wwp    Assessment/Plan:   82 y.o. male admitted 02/25/2015, POD1 R TFN    -  Hct 32, will follow  -  WBAT RLE, ice/elevate   - PT/OT/OOB  -  Imaging:  Post Op XR pending  - Diet:  Geri  - Bowel regimen  - Pain control  - Abx: periop vanco complete  - DVT ppx: lovenox  - Dispo: pending PT, return of bowel/bladder function      Graylon GunningSteven Karnyski, MD   Orthopaedic  Surgery Resident, PGY-1  02/27/2015 6:48 AM    Ortho Attending    Patient seen and evaluated. Resident or MLP note reviewed, findings confirmed. I agree with the assessment and treatment plan as outlined.     Arvil PersonsBrian D Coumba Kellison, MD, 02/27/2015 12:13 PM

## 2015-02-27 NOTE — Progress Notes (Signed)
SW met with pts Dtr at bedside and completed SNF packet for short term rehab.   2 facility choices were obtained. The family was notified that 10 facilities are required and additional choices may be needed if there is no bed offer made and patient is medically stable for discharge.  SW requested a PRI and is sending the packet to the placement office. Will continue to follow for discharge planning.   Jonathan Cisneros  02/27/2015  3:20 PM

## 2015-02-27 NOTE — Progress Notes (Signed)
Anesthesiology Nerve Block Progress Note    Pain Consult  LOS: 99231         Currently there is no pain in the right lower extremity after surgery.    Overnight, the pain has been well controlled.        Significant 24hr Events:  Pruritus: no     Nausea: no     Positional Headache: no  Confusion: no   Sedation: no   Respiratory Depression: no  Shortness of Breath: no       Most Recent Vitals:  Last Filed Vitals    02/27/15 0903   BP: 98/50   Pulse:    Resp:    Temp:    SpO2:      Last Nursing documented pain:  0-10 Scale: 4 (02/27/15 0745 : Kathaleen Bury, RN)     Mental Status: awake    Sensory Level:     Right lower extremity has Normal sensation   Left lower extremity has Normal sensation    Covers Pain Area: yes  Motor:    Right lower extremity Grade 5: Normal Strength/5       Left lower extremity Grade 5: Normal Strength/5      Scheduled Medications:    polyethylene glycol  17 g Oral 2 times per day    docusate sodium  200 mg Oral 2 times per day    bisacodyl  10 mg Rectal Daily    metoprolol  25 mg Oral 2 times per day    senna  2 tablet Oral Daily    enoxaparin  40 mg Subcutaneous Q24H    calcium citrate-vitamin D  1 tablet Oral BID    cholecalciferol  2,000 Units Oral Daily    acetaminophen  1,000 mg Oral 3 times per day    atorvastatin  20 mg Oral Daily    calcium carbonate  500 mg Oral 2 times per day       PRN Medications:   bisacodyl  10 mg Rectal Daily PRN    haloperidol  0.5 mg Oral Q6H PRN    ondansetron  4 mg Intravenous Q6H PRN    oxyCODONE  2.5 mg Oral Q2H PRN    Or    oxyCODONE  5 mg Oral Q2H PRN    morphine  2 mg Intravenous Q2H PRN    Or    morphine  4 mg Intravenous Q2H PRN    nalbuphine  3 mg Intravenous Q3H PRN    camphor-menthol   Topical PRN       Meds Administered in the Last 24 Hours:  acetaminophen (TYLENOL) tablet 1,000 mg     Date Action Dose Route User    02/27/2015 0436 Given 1000 mg Oral Yehuda Budd, RN    02/26/2015 2122 Given 1000 mg Oral Yehuda Budd, RN     02/26/2015 1319 Given 1000 mg Oral Ermalene Postin, RN      atorvastatin (LIPITOR) tablet 20 mg     Date Action Dose Route User    02/26/2015 1659 Given 20 mg Oral Ermalene Postin, RN      calcium carbonate (TUMS) chewable tablet 500 mg     Date Action Dose Route User    02/27/2015 0904 Given 500 mg Oral Kathaleen Bury, RN    02/26/2015 2122 Given 500 mg Oral Yehuda Budd, RN    02/26/2015 1159 Given 500 mg Oral Ermalene Postin, RN      calcium citrate-vitamin D (CITRACAL+D) 315-250 MG-UNIT per tablet 1  tablet     Date Action Dose Route User    02/27/2015 0901 Given 1 tablet Oral Kathaleen BuryWolak, Mary, RN    02/26/2015 1659 Given 1 tablet Oral Ermalene PostinSlade, Caitlin, RN    02/26/2015 1200 Given 1 tablet Oral Ermalene PostinSlade, Caitlin, RN      docusate sodium (COLACE) capsule 200 mg     Date Action Dose Route User    02/27/2015 0901 Given 200 mg Oral Kathaleen BuryWolak, Mary, RN      enoxaparin (LOVENOX) injection 40 mg     Date Action Dose Route User    02/27/2015 0901 Given 40 mg Subcutaneous Kathaleen BuryWolak, Mary, RN      ketorolac (TORADOL) injection 15 mg     Date Action Dose Route User    02/27/2015 0901 Given 15 mg Intravenous Kathaleen BuryWolak, Mary, RN    02/27/2015 [id number] Given 15 mg Intravenous Yehuda BuddSeubert, Alexis, RN      oxyCODONE (ROXICODONE) IR tablet 2.5 mg     Date Action Dose Route User    02/27/2015 0145 Given 2.5 mg Oral Yehuda BuddSeubert, Alexis, RN      polyethylene glycol (GLYCOLAX/MIRALAX) powder 17 g     Date Action Dose Route User    02/26/2015 1159 Given 17 g Oral Ermalene PostinSlade, Caitlin, RN      polyethylene glycol [facility](GLYCOLAX/MIRALAX) powder 17 g     Date Action Dose Route User    02/27/2015 0901 Given 17 g Oral Kathaleen BuryWolak, Mary, RN      senna [facility](SENOKOT) tablet 2 tablet     Date Action Dose Route User    02/27/2015 0901 Given 2 tablet Oral Kathaleen BuryWolak, Mary, RN    02/26/2015 1200 Given 2 tablet Oral Ermalene PostinSlade, Caitlin, RN      sodium chloride 0.9 % IV     Date Action Dose Route User    02/27/2015 0039 New Bag 125 mL/hr Intravenous Yehuda BuddSeubert, Alexis, RN    02/26/2015 1207 New Bag 125 mL/hr Intravenous Ermalene PostinSlade, Caitlin,  RN      Vancomycin (VANCOCIN) IV 1,000 mg     Date Action Dose Route User    02/26/2015 1214 New Bag 1000 mg Intravenous Ermalene PostinSlade, Caitlin, RN      cholecalciferol (VITAMIN D) tablet TABS 2,000 Units     Date Action Dose Route User    02/27/2015 0901 Given 2000 Units Oral Kathaleen BuryWolak, Mary, RN    02/26/2015 1200 Given 2000 Units Oral Ermalene PostinSlade, Caitlin, RN           Lab Results:   Most recent platelet count:     Lab results: 02/25/15  1137   PLATELETS 273     Most recent GFR:     Lab results: 02/26/15  1704   GFR,BLACK 86   GFR,CAUCASIAN 74     Most recent creatinine:     Lab results: 02/26/15  1704   CREATININE 0.78     Most recent PT/INR:     Lab results: 02/25/15  1137   PROTIME 16.5*   INR 1.4*         Assessment/Plan:   Patient is postoperative day # 1 after Procedure(s):  FEMUR IM RODDING TFN , with pain well controlled with a femoral nerve block.  Continue with current pain regimen.           Author: Buckner MaltaAlbert Sung Jin Ebony Rickel, DO as of 02/27/2015  at 10:32 AM

## 2015-02-27 NOTE — Progress Notes (Signed)
Pt has not voided since straight cath at 0430. Pt denies the urge to void. Bladder scanned for 180ml now. Ortho on call notified and aware. Will continue to monitor.    Kathaleen BuryMary Wolak, RN

## 2015-02-27 NOTE — Progress Notes (Signed)
Inpatient Geriatric Medicine Progress Note    Interval History: This am was speaking about how "white everything is" looking up at the ceiling. Admits that he feels little confused. Spoke about the many xray machines he saw. Was aware of self, not place. Able to tell me who his family were present at bedside. Told me why he was in the hospital. Calm. Comfortable. Denies discomfort.    Intake/Output    Intake/Output Summary (Last 24 hours) at 02/27/15 1606  Last data filed at 02/27/15 1459   Gross per 24 hour   Intake 4072.08 ml   Output   1075 ml   Net 2997.08 ml        Vital Signs:   Temp:  [36 C (96.8 F)-36.9 C (98.4 F)] 36.2 C (97.2 F)  Heart Rate:  [82-99] 97  Resp:  [14-18] 14  BP: (90-103)/(42-58) 92/54       Physical Exam:    General: elderly male younger appearing than stated age; pleasant;   HEENT: ncat  Cardiovascular: irregularly irregular;    Pulmonary: fair air entry b/l  Gastrointestinal: soft nt  EXT: no edema    Data:      Recent Labs  Lab 02/26/15  1704 02/26/15  1327 02/26/15  0745 02/25/15  1943 02/25/15  1137   WBC  --   --   --   --  7.6   HEMOGLOBIN 10.5*  --  9.2* 11.0* 12.7*   HEMATOCRIT 32* 33* 28* 34* 40   PLATELETS  --   --   --   --  273     Other Labs:      Recent Labs  Lab 02/26/15  1704 02/25/15  1137   SODIUM 141 141   POTASSIUM 3.9 4.0   CHLORIDE 107 103   CO2 23 25   UN 17 18   CREATININE 0.78 0.84   GFR,CAUCASIAN 74 72   GFR,BLACK 86 83   GLUCOSE 148* 110*   CALCIUM 8.2* 8.9      X-rays the past 24 hours: Ct Hip Right Without Contrast    Result Date: 02/26/2015  IMPRESSION:  Proximal right femoral fracture, as described.  END OF REPORT    * Femur Right Standard Ap And Lateral    Result Date: 02/25/2015  IMPRESSION:  Proximal right femoral fracture  END OF REPORT    * Hip Right Ap And Lateral Views    Result Date: 02/27/2015  IMPRESSION:  Right trochanteric fixation nail with long stem intramedullary rod in near anatomic alignment.  END OF REPORT    * Hip  Right Ap And Lateral Views    Result Date: 02/25/2015  IMPRESSION:  Proximal right femoral fracture  END OF REPORT    Portable Hip Right Fluoro In Or    Result Date: 02/26/2015  IMPRESSION:  Fluoroscopic images as described. Please refer to procedure notes for additional details.  END REPORT       Assessment/Plan  Active Hospital Problems    Diagnosis    R IMN 02/26/15    HTN (hypertension)    A-fib    CAD (coronary artery disease)     S/p CABG      CHF (congestive heart failure)      Resolved Hospital Problems    Diagnosis   No resolved problems to display.     This is a 29M with hx of CAD s/p CABG, CHF, HTN, Afib on Eliquis who presented s/p fall, found to have right  hip frax - s/p Sx:    S/P Fall with Right Hip Frax - POD 1:  -Pain Control - with ATC Tylenol, PRN Oxycodone, Morphine - used one today  -BM regimen - none yet, on Colace, Senna, Miralax - if still no BM, give standing suppository  -On DVT prophylaxis -- on Eliquis at baseline  -Tolerating diet - eating well  -With some confusion today - though redirectable    Hypotension - likely from ABLA: holding stable currently  -Has received fluid boluses & PRBCs  -Monitor H&H - last checked at 33-->32  -Lopressor dose decreased  -Other BP meds held    Hx of CAD s/p CABG:  -Continue BB  -not on Aspirin    Afib - rate controlled:  -Lopressor decreased  -Eliquis held    CHF --chronic - likely diastolic but EF not known  -Lasix & Potassium tabs held    Jonathan Harshman, DO   02/27/2015   4:06 PM

## 2015-02-27 NOTE — Progress Notes (Signed)
Pt still has not voided since straight cath at 0430. Bladder scanned for 248ml. Pt states he feels he could void and currently up to commode. Ortho on call notified. Will continue to monitor.    Kathaleen BuryMary Wolak, RN

## 2015-02-27 NOTE — Progress Notes (Signed)
Physical Therapy    PT treatment: there-ex, for further details see PT evaluaiton.       02/27/15 1021   Therapeutic Exercises   Exercises Performed LE   Ankle Pumps-Reps 15   Glut Sets-Reps 10   Heel Slides, Supine Bilaterally   Heel Slides, Supine-Assist Active assisted  (AROM LLE)   Heel Slides, Supine-Reps 10   Knee Flexion, Sitting Bilaterally   Knee Flexion, Sitting-Assist Active   Knee Flexion, Sitting-Reps 15   Quad Sets Bilaterally   Quad Sets-Reps 15   Additional comments tolerate exercise well   Plan/Recommendation   Treatment Interventions Restorative PT   PT Frequency 5-7x/wk;30 minute sessions   Time Calculation   PT Timed Codes 11   PT Untimed Codes 0   PT Total Treatment 11   PT Charges   [facility] PT Charges Therapeutic Exercise (15 min)     Angelina SheriffIrene Letisha Yera PT

## 2015-02-27 NOTE — Plan of Care (Signed)
Problem: Impaired ADL  Goal: Increase ADL Independence  GROOMING - Patient will groom themselves and complete upper body dressing with supervision at edge of bed - Time Frame: 5-7 days  LOWER BODY DRESSING - Patient will complete lower body dressing with reacher and sock aid and moderate assist at edge of bed - Time Frame: 5-7 days  TOILETING - Patient will complete toileting with no device and moderate assist on commode - Time Frame: 5-7 days  ADL MOBILITY - Patient will stand pivot transfer to commode with minimal assist, Adaptive Device: rolling walker - Time Frame: 5-7 days

## 2015-02-28 ENCOUNTER — Other Ambulatory Visit: Payer: Self-pay

## 2015-02-28 DIAGNOSIS — M810 Age-related osteoporosis without current pathological fracture: Secondary | ICD-10-CM

## 2015-02-28 LAB — BASIC METABOLIC PANEL
Anion Gap: 14 (ref 7–16)
Anion Gap: 13 (ref 7–16)
CO2: 22 mmol/L (ref 20–28)
CO2: 21 mmol/L (ref 20–28)
Calcium: 8.7 mg/dL (ref 8.6–10.2)
Calcium: 8.7 mg/dL (ref 8.6–10.2)
Chloride: 100 mmol/L (ref 96–108)
Chloride: 99 mmol/L (ref 96–108)
Creatinine: 1.22 mg/dL — ABNORMAL HIGH (ref 0.67–1.17)
Creatinine: 1.11 mg/dL (ref 0.67–1.17)
GFR,Black: 56 * — AB
GFR,Black: 63 *
GFR,Caucasian: 48 * — AB
GFR,Caucasian: 54 * — AB
Glucose: 138 mg/dL — ABNORMAL HIGH (ref 60–99)
Glucose: 119 mg/dL — ABNORMAL HIGH (ref 60–99)
Lab: 33 mg/dL — ABNORMAL HIGH (ref 6–20)
Lab: 37 mg/dL — ABNORMAL HIGH (ref 6–20)
Potassium: 4.7 mmol/L (ref 3.3–5.1)
Potassium: 4.7 mmol/L (ref 3.3–5.1)
Sodium: 136 mmol/L (ref 133–145)
Sodium: 133 mmol/L (ref 133–145)

## 2015-02-28 LAB — HCT AND HGB
Hematocrit: 26 % — ABNORMAL LOW (ref 40–51)
Hemoglobin: 8.6 g/dL — ABNORMAL LOW (ref 13.7–17.5)

## 2015-02-28 LAB — MCHC: MCHC: 33 g/dL (ref 32–37)

## 2015-02-28 MED ORDER — SODIUM CHLORIDE 0.9 % IV BOLUS *I*
500.0000 mL | Freq: Once | Status: AC
Start: 2015-02-28 — End: 2015-02-28
  Administered 2015-02-28: 500 mL via INTRAVENOUS

## 2015-02-28 MED ORDER — PANTOPRAZOLE SODIUM 40 MG PO TBEC *I*
40.0000 mg | DELAYED_RELEASE_TABLET | Freq: Every morning | ORAL | Status: DC
Start: 2015-02-28 — End: 2015-03-03
  Administered 2015-02-28 – 2015-03-03 (×4): 40 mg via ORAL
  Filled 2015-02-28 (×4): qty 1

## 2015-02-28 NOTE — Progress Notes (Signed)
Inpatient Geriatric Medicine Progress Note    Interval History: Overnight said he had muscle spasms - was saying he asked for pain med but didn't get it. Otherwise, did refuse Tylenol dose this morning. Was aware of self, place and why he is in hospital. Was concerned that he still gets confused at times - speaking of the "white" observed with the xrays.  Family at bedside.    Intake/Output    Intake/Output Summary (Last 24 hours) at 02/28/15 1030  Last data filed at 02/28/15 0543   Gross per 24 hour   Intake   1220 ml   Output    350 ml   Net    870 ml        Vital Signs:   Temp:  [36.2 C (97.2 F)-37.2 C (99 F)] 36.4 C (97.5 F)  Heart Rate:  [88-133] 133  Resp:  [14-20] 18  BP: (84-101)/(52-68) 94/56       Physical Exam:    General: elderly male in nad  Cardiovascular: irregularly irregular; slight tachycardic  Pulmonary: fair air entry bilateral  Gastrointestinal: soft nt mild distension  EXT: no edema bilateral LE    Data:      Recent Labs  Lab 02/27/15  1821 02/26/15  1704 02/26/15  1327 02/26/15  0745  02/25/15  1137   WBC  --   --   --   --   --  7.6   HEMOGLOBIN 8.8* 10.5*  --  9.2*  < > 12.7*   HEMATOCRIT 28* 32* 33* 28*  < > 40   PLATELETS  --   --   --   --   --  273   < > = values in this interval not displayed.  Other Labs:      Recent Labs  Lab 02/27/15  1821 02/26/15  1704 02/25/15  1137   SODIUM 137 141 141   POTASSIUM 4.0 3.9 4.0   CHLORIDE 104 107 103   CO2 UN 27* 17 18   CREATININE 1.24* 0.78 0.84   GFR,CAUCASIAN 47* 74 72   GFR,BLACK 55* 86 83   GLUCOSE 138* 148* 110*   CALCIUM 8.4* 8.2* 8.9      X-rays the past 24 hours: Ct Hip Right Without Contrast    Result Date: 02/26/2015  IMPRESSION:  Proximal right femoral fracture, as described.  END OF REPORT    * Femur Right Standard Ap And Lateral    Result Date: 02/25/2015  IMPRESSION:  Proximal right femoral fracture  END OF REPORT    * Hip Right Ap And Lateral Views    Result Date: 02/27/2015  IMPRESSION:  Right  trochanteric fixation nail with long stem intramedullary rod in near anatomic alignment.  END OF REPORT    * Hip Right Ap And Lateral Views    Result Date: 02/25/2015  IMPRESSION:  Proximal right femoral fracture  END OF REPORT    Portable Hip Right Fluoro In Or    Result Date: 02/26/2015  IMPRESSION:  Fluoroscopic images as described. Please refer to procedure notes for additional details.  END REPORT       Assessment/Plan  Active Hospital Problems    Diagnosis    R IMN 02/26/15    HTN (hypertension)    A-fib    CAD (coronary artery disease)     S/p CABG      CHF (congestive heart failure)      Resolved Hospital Problems  Diagnosis   No resolved problems to display.     This is a 59M with hx of CAD s/p CABG, CHF, HTN, Afib on Eliquis who presented s/p fall, found to have right hip frax - s/p Sx:    S/P Fall with Right Hip Frax - POD 2:  -Pain Control - with ATC Tylenol, PRN Oxycodone, Morphine - no PRNs used today  -BM regimen - had BM   -On DVT prophylaxis -- on Eliquis at baseline  -Tolerating diet   -Less confusion today    Hypotension - likely from ABLA: holding stable currently  -Has received fluid boluses & PRBCs  -Monitor H&H - last checked at 28  -Lopressor dose decreased  -Other BP meds held due to soft BPs - asymptomatic    Acute Kidney Injury in setting of post-op/low bps/hypovolemia:  -Creatinine 0.7 to 1.2 - checked today - holding stable  -Daily BMPs  -Given 500cc fluid bolus x 1    Hx of CAD s/p CABG:  -Continue BB  -not on Aspirin    Afib - rate controlled:  -Lopressor decreased  -Eliquis held    CHF --chronic - likely diastolic but EF not known  -Lasix & Potassium tabs held    Hazelee Harbold, DO   02/28/2015   10:30 AM

## 2015-02-28 NOTE — Progress Notes (Signed)
Physical Therapy    Treatment session completed.  Patient pleasant and cooperative however declining mobility at this time d/t fatigue.  Patients daughters report his BP has been low, however recent BP was 101/63 which is "the best it's been".  When asked, pt. reports he has still been feeling lightheaded.  Reviewed supine and seated exercises and provided training to patients' daughters.  Continue SNF rehab plan.      02/28/15 1450   PT Tracking   PT TRACKING PT Coverage   Visit Number   Visit Number 2   Precautions/Observations   Precautions used Yes   Weight Bearing Status RLE WBAT   LDA Observation None   Fall Precautions General falls precautions   Pain Assessment   *Is the patient currently in pain? Yes   Pain (Before,During, After) Therapy During   0-10 Scale 3   Pain Location Knee   Pain Orientation Right   Pain Intervention(s) Cold applied;Repositioned;Refer to nursing for pain management   Vision    Current Vision No visual deficits   Cognition   Cognition No deficit noted   Additional Comments "groggy"   LE Assessment   LE Assessment Impaired AROM RLE;Full AROM LLE   Sensation   Sensation No apparent deficit   Bed Mobility   Bed mobility Not tested   Additional comments pt. greeted in and remained in recliner chair   Transfers   Transfers Tested   Sit to Stand Minimum ;1 person assist   Stand to sit Contact guard;1 person assist  (cues for safe hand placement)   Transfer Assistive Device rolling walker   Additional comments sit to stand from recliner chair in order to reposition and scoot back in chair; pt. reported mild dizziness   Mobility   Mobility Not tested (comment)   Additional comments pt. declined mobility at this time as he reports "they just got me up into the chair and I am tired"    Therapeutic Exercises   Exercises Performed LE   Ankle Pumps Bilaterally   Ankle Pumps-Reps 20   Glut Sets-Reps 10   Heel Slides, Supine Right   Heel Slides, Supine-Assist Active assisted   Heel Slides,  Supine-Reps 10   Hip Abduction, Supine Right   Hip Abduction, Supine-Assist Active assisted   Hip Abduction, Supine-Reps 10   Hip Flexion, Sitting Right   Hip Flexion, Sitting-Assist Active assisted   Hip Flexion, Sitting-Reps 10   Knee Extension, Sitting (LAQ) Right   Knee Ext, Sitting (LAQ)-Assist Active assisted   Knee Ext, Sitting (LAQ)-Reps 10   Knee Flexion, Sitting Right   Knee Flexion, Sitting-Assist Active assisted   Knee Flexion, Sitting-Reps 5  (with passive stretch)   Quad Sets Right   Quad Sets-Reps 10   Additional comments Educated pt. and daughters of recommended 3x/day frequency   Balance   Balance Not tested   Additional Comments   Additional comments Pt. pleasant and cooperative but declines mobility at this time d/t fatigue; pt's daughter reports BP was just taken and was 101/63 "the best it's been" , however pt. reports continued lightheadedness   Assessment   Brief Assessment Remains appropriate for skilled therapy   Problem List Impaired LE strength;Impaired LE ROM;Impaired transfers;Impaired ambulation   Patient / Family Goal pt's daughters in contact with admissions office of [facility] and are planning on SNF rehab plan   Plan/Recommendation   Treatment Interventions Restorative PT   PT Frequency 5-7x/wk;30 minute sessions   Hospital Stay Recommendations Out of  bed with nursing assist;ROM daily   Discharge Recommendations Skilled Nursing Facility Rehab   PT Discharge Equipment Recommended None   Assessment/Recommendations Reviewed With: Family;Patient   Time Calculation   PT Timed Codes 38   PT Untimed Codes 0   PT Total Treatment 38   PT Charges   $HH PT Charges Therapeutic Activity (15 min);Therapeutic Exercise (15 minx2)     Allayne Butcher, DPT  [phone]

## 2015-02-28 NOTE — Progress Notes (Addendum)
Orthopaedic Surgery Progress Note  2 Days Post-Op    Patient: Jonathan Cisneros   MRN: [id number]   DOA: 02/25/2015   Attending: Arvil Persons, MD    Subjective: Straight cath x1 overnight. Pain controlled. Denies CP/SOB. Feels like he has a cold this AM.    Objective:  Temp:  [36.1 C (97 F)-37.2 C (99 F)] 36.8 C (98.2 F)  Heart Rate:  [88-99] 98  Resp:  [14-20] 18  BP: (84-103)/(50-64) 92/62  Vitals:    02/28/15 0530   BP: 92/62   Pulse: 98   Resp: 18   Temp: 36.8 C (98.2 F)   Weight:    Height:        Intake/Output Summary (Last 24 hours) at 02/28/15 0620  Last data filed at 02/28/15 0543   Gross per 24 hour   Intake   1420 ml   Output    350 ml   Net   1070 ml       Recent Labs  Lab 02/27/15  1821 02/26/15  1704 02/26/15  1327 02/26/15  0745  02/25/15  1137   WBC  --   --   --   --   --  7.6   HEMOGLOBIN 8.8* 10.5*  --  9.2*  < > 12.7*   HEMATOCRIT 28* 32* 33* 28*  < > 40   PLATELETS  --   --   --   --   --  273   < > = values in this interval not displayed.    Recent Labs  Lab 02/27/15  1821 02/26/15  1704 02/25/15  1137   SODIUM 137 141 141   POTASSIUM 4.0 3.9 4.0   CO2 UN 27* 17 18   CREATININE 1.24* 0.78 0.84   GLUCOSE 138* 148* 110*   CALCIUM 8.4* 8.2* 8.9       Recent Labs  Lab 02/25/15  1137   INR 1.4*   PROTIME 16.5*       No components found with this basename: APTT    Physical Exam:  AOx3, NAD, supine in bed  RRR, Unlabored breathing  Abdomen firm, non tender    RLE:   Hip dressing clean, dry, intact  Motor: + ankle DF/PF, toe flex/ext  Sensory: SILT 1st dorsal web space/medial/lateral/plantar/dorsal foot  Ciculation: 2+ DP/PT pulses, toes wwp    Assessment/Plan:   82 y.o. male admitted 02/25/2015, POD2 R TFN    -  Hct 28, will follow  -  WBAT RLE, ice/elevate   - PT/OT/OOB  -  Imaging:  adequate  - Diet:  Geri  - Bowel regimen  - Pain control  - DVT ppx: lovenox  - Dispo: SNF      Sherian Rein, MD   Orthopaedic Surgery Resident  02/28/2015 6:20 AM    Ortho Attending    Patient seen and  evaluated. Resident or MLP note reviewed, findings confirmed. I agree with the assessment and treatment plan as outlined.     Arvil Persons, MD, 02/28/2015 5:08 PM

## 2015-02-28 NOTE — Plan of Care (Signed)
Per Dr. Idamae Lusher, Eliquis can be restarted when hemodynamics stabilize and trend back up/bleeding risk is down. Most likely one week following surgery.    Tonny [place] Yonatan Guitron, PA

## 2015-02-28 NOTE — Progress Notes (Signed)
Pt DNV void since 0430 on 02/27/15. Pt bladder scanned at 2200 for 310 in bladder. Notified ortho resident Dr.Roberts. Per Dr. Su Hilt straight cath pt once bladder scan says 400. Bladder scanned pt at 0000 scanned for 400 in bladder. Straight cath patient at 0010 for 350 of amber yellow urine. Patient tolerated procedure well. Will continue to monitor. Gevena Mart, RN

## 2015-03-01 LAB — BASIC METABOLIC PANEL
Anion Gap: 11 (ref 7–16)
CO2: 22 mmol/L (ref 20–28)
Calcium: 9.3 mg/dL (ref 8.6–10.2)
Chloride: 100 mmol/L (ref 96–108)
Creatinine: 0.93 mg/dL (ref 0.67–1.17)
GFR,Black: 78 *
GFR,Caucasian: 67 *
Glucose: 104 mg/dL — ABNORMAL HIGH (ref 60–99)
Lab: 30 mg/dL — ABNORMAL HIGH (ref 6–20)
Potassium: 5.1 mmol/L (ref 3.3–5.1)
Sodium: 133 mmol/L (ref 133–145)

## 2015-03-01 LAB — VITAMIN D
25-OH VIT D2: <4 ng/mL
25-OH VIT D3: 19 ng/mL
25-OH Vit Total: 19 ng/mL — ABNORMAL LOW (ref 30–60)

## 2015-03-01 LAB — MCHC
MCHC: 32 g/dL (ref 32–37)
MCHC: 32 g/dL (ref 32–37)

## 2015-03-01 LAB — HEMOGLOBIN: Hemoglobin: 9.4 g/dL — ABNORMAL LOW (ref 13.7–17.5)

## 2015-03-01 LAB — HEMATOCRIT
Hematocrit: 23 % — ABNORMAL LOW (ref 40–51)
Hematocrit: 29 % — ABNORMAL LOW (ref 40–51)

## 2015-03-01 MED ORDER — SODIUM CHLORIDE 0.9 % IV SOLN WRAPPED *I*
3.0000 mL/h | Status: DC
Start: 2015-03-01 — End: 2015-03-03
  Administered 2015-03-01: 3 mL/h via INTRAVENOUS

## 2015-03-01 NOTE — Progress Notes (Signed)
Orthopaedic Surgery Progress Note  3 Days Post-Op    Patient: Jonathan Cisneros   MRN: [id number]   DOA: 02/25/2015   Attending: Arvil Persons, MD    Subjective: Foley placed yest evening. Minimal pain, slept through night. Denies numbness/tingling.  Denies F/C, CP/SOB. Working with PT.    Objective:  Temp:  [36.4 C (97.5 F)-37 C (98.6 F)] 36.4 C (97.5 F)  Heart Rate:  [99-133] 101  Resp:  [16-18] 16  BP: (88-101)/(52-68) 88/52  Vitals:    03/01/15 0550   BP:    Pulse:    Resp:    Temp:    Weight: 76.2 kg (168 lb)   Height:        Intake/Output Summary (Last 24 hours) at 03/01/15 0603  Last data filed at 03/01/15 0552   Gross per 24 hour   Intake    240 ml   Output   1375 ml   Net  -1135 ml       Recent Labs  Lab 02/28/15  1645 02/27/15  1821 02/26/15  1704  02/25/15  1137   WBC  --   --   --   --  7.6   HEMOGLOBIN 8.6* 8.8* 10.5*  < > 12.7*   HEMATOCRIT 26* 28* 32*  < > 40   PLATELETS  --   --   --   --  273   < > = values in this interval not displayed.    Recent Labs  Lab 02/28/15  1645 02/28/15  1040 02/27/15  1821   SODIUM 133 136 137   POTASSIUM 4.7 4.7 4.0   CO2 UN 37* 33* 27*   CREATININE 1.11 1.22* 1.24*   GLUCOSE 119* 138* 138*   CALCIUM 8.7 8.7 8.4*       Recent Labs  Lab 02/25/15  1137   INR 1.4*   PROTIME 16.5*       No components found with this basename: APTT    Physical Exam:  AOx3, NAD, supine in bed  RRR, Unlabored breathing  Abdomen firm, non tender    RLE:   Hip dressing changed- incisions clean, dry, intact without surrounding erythema  Motor: + ankle DF/PF, toe flex/ext  Sensory: SILT 1st dorsal web space/medial/lateral/plantar/dorsal foot  Ciculation: 2+ DP/PT pulses, toes wwp    Assessment/Plan:   82 y.o. male admitted 02/25/2015, POD3 R TFN, foley placed for retention    -  Hct 26, will follow  -  WBAT RLE, ice/elevate   - PT/OT/OOB  -  Imaging:  adequate  - Diet:  Geri  - Bowel regimen  - Pain control  - DVT ppx: lovenox, will be put back on eliquis for Afib when hct trends back  up  - Creatinine down trending, small boluses as needed for Aki  - Dispo: SNF      Graylon Gunning, MD   Orthopaedic Surgery Resident  03/01/2015 6:03 AM

## 2015-03-01 NOTE — Progress Notes (Signed)
Occupational Therapy Treatment:  D/c recommendation: SNF rehab     03/01/15 1454   OT Tracking   OT Tracking OT Assigned   OT Last Visit   Visit (#) of Five 2   Precautions   Precautions used Yes   Weight Bearing Status WBAT;RLE   Fall Precautions General falls precautions   LDA Observation Other (Comment)  (receiving blood transfusion)   Other Daughter present   ADL Assessment   Eating Set up   Assist Needed With: Set up   Where Eating Assessed Chair   LE Dressing Not Tested   Toileting Not Tested   Additional Comments Pt reports he uses quad cane instead of sock aid; writer unable to locate quad cane for trial. Pt declining toilet transfer to attempt toileting.   Bed Mobility   Additional Comments Pt declining to get in bed.   Functional Transfers   Additional Comments Pt declining transfers at this time due to "torturous" experience earlier today and feeling pain and fatigue related to blood transfusion.   Balance   Sitting - Static Independent ;Unsupported   Sitting - Dynamic Independent;Unsupported   Standing - Static Not Tested   Standing - Dynamic Not Tested   Cognition   Cognition No deficit noted   UE Treatment   Shoulder AROM Flexion;Extension  (LUE x10 reps, not RUE d/t transfusion)   Elbow AROM Flexion;Extension  (BUE x10 reps)   Forearm AROM Supination;Pronation  (x10 reps)   UE Assessment   Additional Comments Educated pt on completing chair pushups throughout day to strengthen UEs for transfers. Pt simulated and verbalized that he will try them once the transfusion has finished.   Assessment   Assessment Patient making progress, continue with plan of care   Recommendations   Recommendations Skilled Nursing Facility Rehab   Multidisciplinary Communication   Multidisciplinary Communication RN, pt, pt's family   Time Calculation   OT Timed Codes 11   OT Untimed Codes 0   OT Unbilled Time 0   OT Total Treatment 11   OT Charges   [facility] OT Charges Therapeutic Exercise (15 min)   Eulice Rutledge M. [place],  [place]  [address]

## 2015-03-01 NOTE — Progress Notes (Signed)
Physical Therapy    Treatment session completed.       03/01/15 [place]   Visit Number   Visit Number 3   Precautions/Observations   Weight Bearing Status RLE WBAT   LDA Observation None   Fall Precautions General falls precautions   Pain Assessment   Pain Intervention(s) Repositioned;Refer to nursing for pain management   Additional comments pt denies pain at rest but reports pain up to 8/10 in WB R thigh and knee mostly    Bed Mobility   Additional comments NT pt receveived and left in bedside chair with all needs met and NAD. family present and RN updated on status    Transfers   Sit to Stand Verbal cues;2 person assist;Moderate ;Maximum    Stand to sit Verbal cues;2 person assist;Minimum    Transfer Assistive Device rolling walker   Additional comments pt tolerated standing ~ 81mnute in total however, experienced increased WOB, +expiratory wheezing noted as well. pt demonstrates minimal WB acceptance on R LE during standing task. HR following return to sit noted to be 130-139. several minutes rest required for HR to return to resting baseline of 103. RN notifed. did not perform further transfers at this time related to poor response. pt may require SARA lift transfer back to bed if pt is too fatigued. sling was noted to be in room already   Therapeutic Exercises   Ankle Pumps Bilaterally   Ankle Pumps-Reps 20   Glut Sets-Reps 15   Heel Slides, Supine Right   Heel Slides, Supine-Assist Active assisted   Heel Slides, Supine-Reps 15   Knee Extension, Supine (SAQ) Right   Knee Ext, Supine (SAQ)-Assist Active   Knee Ext, Supine (SAQ)-Reps 15   Quad Sets Bilaterally   Quad Sets-Reps 15   Assessment   Brief Assessment Remains appropriate for skilled therapy   Plan/Recommendation   Treatment Interventions Restorative PT   PT Frequency 5-7x/wk;30 minute sessions   Hospital Stay Recommendations Out of bed with nursing assist   Discharge Recommendations [facility]   PT Discharge Equipment Recommended None   Assessment/Recommendations Reviewed With: Nursing;Patient   Time Calculation   PT Timed Codes 35   PT Untimed Codes 0   PT Total Treatment 35   PT Charges   $HH PT Charges Therapeutic Activity (15 min);Therapeutic Exercise (15 min)   AVerneda Skill DPT Pager # [phone]

## 2015-03-01 NOTE — Plan of Care (Signed)
Will transfuse one unit for hypotension and hct of 23    Jonathan Cisneros, [place]

## 2015-03-01 NOTE — Progress Notes (Addendum)
Inpatient Geriatric Medicine Progress Note    Interval History: Overnight foley placed for multiple straight caths. Patient says he slept well. No discussion of seeing "white" or xrays this am. Denies pain. Appears calm and comfortable. Patient questioning need for foley cath. Family member at bedside.    Intake/Output    Intake/Output Summary (Last 24 hours) at 03/01/15 1620  Last data filed at 03/01/15 1304   Gross per 24 hour   Intake    780 ml   Output   1650 ml   Net   -870 ml        Vital Signs:   Temp:  [35.7 C (96.3 F)-36.9 C (98.4 F)] 35.7 C (96.3 F)  Heart Rate:  [97-117] 113  Resp:  [16-19] 16  BP: (88-117)/(52-71) 117/71       Physical Exam:    General: elderly male sitting up in bed;  Cardiovascular: irregularly irregular  Pulmonary: fair air entry bilaterally  Gastrointestinal: soft nt  EXT: no edema bilaterally in LE    Data:      Recent Labs  Lab 03/01/15  0549 02/28/15  1645 02/27/15  1821 02/26/15  1704  02/25/15  1137   WBC  --   --   --   --   --  7.6   HEMOGLOBIN  --  8.6* 8.8* 10.5*  < > 12.7*   HEMATOCRIT 23* 26* 28* 32*  < > 40   PLATELETS  --   --   --   --   --  273   < > = values in this interval not displayed.  Other Labs:      Recent Labs  Lab 02/28/15  1645 02/28/15  1040 02/27/15  1821   SODIUM 133 136 137   POTASSIUM 4.7 4.7 4.0   CHLORIDE 99 100 104   CO2 UN 37* 33* 27*   CREATININE 1.11 1.22* 1.24*   GFR,CAUCASIAN 54* 48* 47*   GFR,BLACK 63 56* 55*   GLUCOSE 119* 138* 138*   CALCIUM 8.7 8.7 8.4*      X-rays the past 24 hours: * Hip Right Ap And Lateral Views    Result Date: 02/27/2015  IMPRESSION:  Right trochanteric fixation nail with long stem intramedullary rod in near anatomic alignment.  END OF REPORT       Assessment/Plan  Active Hospital Problems    Diagnosis    R IMN 02/26/15    HTN (hypertension)    A-fib    CAD (coronary artery disease)     S/p CABG      CHF (congestive heart failure)      Resolved Hospital Problems    Diagnosis      No resolved problems to display.     This is a 66M with hx of CAD s/p CABG, CHF, HTN, Afib on Eliquis who presented s/p fall, found to have right hip frax - s/p Sx:    S/P Fall with Right Hip Frax - POD 3:  -Pain Control - with ATC Tylenol, PRN Oxycodone, Morphine  -Acute Blood Loss Anemia - received one unit PRBC for Hct 23  -BM regimen - had recent BM   -On DVT prophylaxis -- on Eliquis at baseline - as per Ortho, will restart once hemostasis stabilized  -Tolerating diet     Hypotension - improving:  -Has received fluid boluses & PRBCs  -Monitor H&H   -Lopressor dose decreased from original dosing    Acute  Kidney Injury in setting of post-op/low bps/hypovolemia:  -Creatinine 1.11 - improving  -Now with foley catheter  -Daily BMPs    Hx of CAD s/p CABG:  -Continue BB  -not on Aspirin    Afib - rate controlled:  -Lopressor decreased  -Eliquis held as per Ortho    CHF --chronic - likely diastolic but EF unknown:  -Lasix & Potassium tabs held    Jonathan Grantz, DO   03/01/2015   4:20 PM

## 2015-03-01 NOTE — Plan of Care (Signed)
Safety     Prevent any intentional injury Completed or Resolved          Mobility     Functional status is maintained or improved - Geriatric Progressing towards goal          Cognitive function     Cognitive function will be maintained or return to baseline Maintaining        Impaired Balance     Patient will maintain balance to allow for safe mobility and Maintaining        Impaired Mobility (musc)     Patient's mobility is maintained or improved (musc) Maintaining        Nutrition     Nutritional status is maintained or improved - Geriatric Maintaining        Pain/Comfort     Patient's pain or discomfort is manageable Maintaining        Post-Operative Bladder Elimination     Patient is able to empty bladder or return to baseline Maintaining        Post-Operative Bowel Elimination     Elimination pattern is normal or improving Maintaining        Post-Operative Complications     Prevent post-operative complications Maintaining     Patient will remain free from symptoms of infection-post op Maintaining        Post-Operative Hemodynamic Stability     Maintain Hemodynamic Stability Maintaining        Potential for impaired Circulation, Motor, or Sensation     Patient's CMS status is maintained or improved from baseline Maintaining        Psychosocial     Demonstrates ability to cope with illness Maintaining        Safety     Patient will remain free of falls Maintaining

## 2015-03-02 LAB — BASIC METABOLIC PANEL
Anion Gap: 13 (ref 7–16)
CO2: 21 mmol/L (ref 20–28)
Calcium: 8.9 mg/dL (ref 8.6–10.2)
Chloride: 103 mmol/L (ref 96–108)
Creatinine: 0.88 mg/dL (ref 0.67–1.17)
GFR,Black: 82 *
GFR,Caucasian: 71 *
Glucose: 92 mg/dL (ref 60–99)
Lab: 26 mg/dL — ABNORMAL HIGH (ref 6–20)
Potassium: 4.8 mmol/L (ref 3.3–5.1)
Sodium: 137 mmol/L (ref 133–145)

## 2015-03-02 LAB — HCT AND HGB
Hematocrit: 30 % — ABNORMAL LOW (ref 40–51)
Hemoglobin: 9.6 g/dL — ABNORMAL LOW (ref 13.7–17.5)

## 2015-03-02 LAB — EKG 12-LEAD
QRS: 18 degrees
Rate: 77 {beats}/min
Severity: ABNORMAL
Severity: ABNORMAL
T: 190 degrees

## 2015-03-02 LAB — RED BLOOD CELLS
Coded Blood type: 5100
Component blood type: O POS
Dispense status: TRANSFUSED

## 2015-03-02 LAB — HEMATOCRIT: Hematocrit: 30 % — ABNORMAL LOW (ref 40–51)

## 2015-03-02 LAB — MCHC
MCHC: 33 g/dL (ref 32–37)
MCHC: 32 g/dL (ref 32–37)

## 2015-03-02 MED ORDER — METOPROLOL 12.5MG PO TABS (1/2 TAB) *I*
12.5000 mg | ORAL_TABLET | Freq: Once | ORAL | Status: AC
Start: 2015-03-02 — End: 2015-03-02
  Administered 2015-03-02: 12.5 mg via ORAL
  Filled 2015-03-02: qty 1

## 2015-03-02 MED ORDER — PANTOPRAZOLE SODIUM 40 MG PO TBEC *I*
40.0000 mg | DELAYED_RELEASE_TABLET | Freq: Every morning | ORAL | Status: DC
Start: 2015-03-02 — End: 2015-04-21

## 2015-03-02 MED ORDER — METOPROLOL TARTRATE 25 MG PO TABS *I*
37.5000 mg | ORAL_TABLET | Freq: Two times a day (BID) | ORAL | Status: DC
Start: 2015-03-02 — End: 2015-03-03
  Administered 2015-03-02 – 2015-03-03 (×2): 37.5 mg via ORAL
  Filled 2015-03-02 (×4): qty 1

## 2015-03-02 MED ORDER — ELIQUIS 2.5 MG PO TABS
2.5000 mg | ORAL_TABLET | Freq: Two times a day (BID) | ORAL | 1 refills | Status: DC
Start: 2015-03-02 — End: 2016-06-18

## 2015-03-02 NOTE — Progress Notes (Signed)
Inpatient Geriatric Medicine Progress Note    Interval History: Overnight with no acute events noted. States he slept well. Says he has no pain. Was eager to eat breakfast this morning. As per family at bedside, has slept well for past 2 days overnight. Family denies noting any confusion - feels resolved from previous.     Intake/Output    Intake/Output Summary (Last 24 hours) at 03/02/15 0935  Last data filed at 03/02/15 0848   Gross per 24 hour   Intake 798.55 ml   Output   2150 ml   Net -1351.45 ml        Vital Signs:   Temp:  [35.7 C (96.3 F)-37.6 C (99.7 F)] (P) 36.3 C (97.3 F)  Heart Rate:  [96-114] (P) 104  Resp:  [16-19] (P) 18  BP: (100-122)/(52-78) (P) 107/74       Physical Exam:    General: elderly male pleasant and cooperative; about to eat his brkfst this am; nad  Cardiovascular: mild tachycardia this am  Pulmonary: fair air entry b/l  Gastrointestinal: soft nt  EXT: no edema appreciated b/l    Data:      Recent Labs  Lab 03/01/15  1828 03/01/15  0549 02/28/15  1645 02/27/15  1821  02/25/15  1137   WBC  --   --   --   --   --  7.6   HEMOGLOBIN 9.4*  --  8.6* 8.8*  < > 12.7*   HEMATOCRIT 29* 23* 26* 28*  < > 40   PLATELETS  --   --   --   --   --  273   < > = values in this interval not displayed.  Other Labs:      Recent Labs  Lab 03/01/15  1716 02/28/15  1645 02/28/15  1040   SODIUM 133 133 136   POTASSIUM 5.1 4.7 4.7   CHLORIDE 100 99 100   CO2 UN 30* 37* 33*   CREATININE 0.93 1.11 1.22*   GFR,CAUCASIAN 67 54* 48*   GFR,BLACK 78 63 56*   GLUCOSE 104* 119* 138*   CALCIUM 9.3 8.7 8.7      X-rays the past 24 hours: * Hip Right Ap And Lateral Views    Result Date: 02/27/2015  IMPRESSION:  Right trochanteric fixation nail with long stem intramedullary rod in near anatomic alignment.  END OF REPORT       Assessment/Plan  Active Hospital Problems    Diagnosis    R IMN 02/26/15    HTN (hypertension)    A-fib    CAD (coronary artery disease)     S/p CABG      CHF  (congestive heart failure)      Resolved Hospital Problems    Diagnosis   No resolved problems to display.     This is a 72M with hx of CAD s/p CABG, CHF, HTN, Afib on Eliquis who presented s/p fall, found to have right hip frax - s/p Sx:    S/P Fall with Right Hip Frax - POD 4:   -Pain Control - with ATC Tylenol, PRN Oxycodone, Morphine - no prns being used for few days  -Acute Blood Loss Anemia - received one unit PRBC for Hct 23 - improved to hct 29  -BM regimen - having BMs  -On DVT prophylaxis -- on Eliquis at baseline - as per Ortho  -Tolerating diet     Hypotension - improving -  BPs picking up; with some tachycardia  -Has received fluid boluses & PRBCs  -Monitor H&H - stable  -Increased BB from  BID to 37.5mg  BID today    Acute Kidney Injury in setting of post-op/low bps/hypovolemia:  -Creatinine - improving    Urinary Retention s/p Foley Cath placement:  -As per Ortho, attempt for voiding trial today    Hx of CAD s/p CABG:  -Continue BB  -not on Aspirin    Afib - rate controlled:  -Increasing Lopressor dose  -Eliquis held as per Ortho    CHF --chronic - likely diastolic but EF unknown:  -Lasix & Potassium tabs held    Dispo: from our perspective likely tomorrow pending continuing improvement in BPs and HR    Nieves Chapa, DO   03/02/2015   9:35 AM

## 2015-03-02 NOTE — Progress Notes (Addendum)
Orthopaedic Surgery Progress Note  4 Days Post-Op    Patient: Jonathan Cisneros   MRN: [id number]   DOA: 02/25/2015   Attending: Arvil Persons, MD    Subjective: Transfused one unit PRBCs yesterday. Minimal pain, slept through night. Denies numbness/tingling.  Denies F/C, CP/SOB, lightheadedness. Working well with PT.    Objective:  Temp:  [35.7 C (96.3 F)-37.2 C (99 F)] 37.2 C (99 F)  Heart Rate:  [97-114] 98  Resp:  [16-19] 18  BP: (90-122)/(52-78) 122/78  Vitals:    03/02/15 0230   BP: 122/78   Pulse: 98   Resp:    Temp: 37.2 C (99 F)   Weight:    Height:        Intake/Output Summary (Last 24 hours) at 03/02/15 0637  Last data filed at 03/01/15 2256   Gross per 24 hour   Intake 1218.55 ml   Output   1075 ml   Net 143.55 ml       Recent Labs  Lab 03/01/15  1828 03/01/15  0549 02/28/15  1645 02/27/15  1821  02/25/15  1137   WBC  --   --   --   --   --  7.6   HEMOGLOBIN 9.4*  --  8.6* 8.8*  < > 12.7*   HEMATOCRIT 29* 23* 26* 28*  < > 40   PLATELETS  --   --   --   --   --  273   < > = values in this interval not displayed.    Recent Labs  Lab 03/01/15  1716 02/28/15  1645 02/28/15  1040   SODIUM 133 133 136   POTASSIUM 5.1 4.7 4.7   CO2 UN 30* 37* 33*   CREATININE 0.93 1.11 1.22*   GLUCOSE 104* 119* 138*   CALCIUM 9.3 8.7 8.7       Recent Labs  Lab 02/25/15  1137   INR 1.4*   PROTIME 16.5*       No components found with this basename: APTT    Physical Exam:  AOx3, NAD, supine in bed  Tachycardic, Unlabored breathing  Abdomen soft, non tender    RLE:   Hip dressing clean, dry, intact without ss  Motor: + ankle DF/PF, toe flex/ext  Sensory: SILT 1st dorsal web space/medial/lateral/plantar/dorsal foot  Ciculation: 2+ DP/PT pulses, toes wwp    Assessment/Plan:   82 y.o. male admitted 02/25/2015, POD4 R TFN, foley placed for retention    -  Post transfusion Hct 29 yest, will recheck  -  WBAT RLE, ice/elevate   - PT/OT/OOB  -  Imaging:  adequate  - Diet:  Geri  - Bowel regimen  - Pain control  - DVT ppx:  lovenox, will be put back on eliquis for Afib when hct trends back up  - Creatinine down trending, small boluses as needed for Aki  - Foley for rentention, possible voiding trial today  - Dispo: SNF      Graylon Gunning, MD   Orthopaedic Surgery Resident  03/02/2015 6:37 AM      Ortho Attending    Patient seen and evaluated. Resident or MLP note reviewed, findings confirmed. I agree with the assessment and treatment plan as outlined.     Arvil Persons, MD, 03/02/2015 7:14 PM

## 2015-03-02 NOTE — Progress Notes (Signed)
Occupational Therapy   03/02/15 1414   OT Tracking   OT Tracking OT Assigned   OT Last Visit   Visit (#) of Five 3   Precautions   Precautions used Yes   Weight Bearing Status WBAT;RLE   Fall Precautions General falls precautions;Bed alarm engaged;Chair alarm engaged   LDA Observation IV lines;Foley cath  (disconnected)   Other dtr present during session.    Pain Assessment   *Is the patient currently in pain? Yes   Pain Location 1 Hip   Orientation of Pain 1 Right   Pain Scale 1 ("22", during standing. )   Pain 1 During   Pain Intervention(s) 1 Repositioned;Refer to nursing for pain management   ADL Assessment   Toileting Dependent   Where Toileting Assessed Commode chair   Assist needed with Bedside commode;Perineal hygiene  (pt was not wearing brief)   Bed Mobility   Rolling for Self Care Moderate Assist to Right   Supine to Sit One person assist;HOB elevated;Use of bed rail   Functional Transfers   Sit to Stand Moderate Assist;Two person assist  (with rolling walker)   Stand to Sit Moderate Assist;Two person assist   Toilet Transfers Maximum assist;Two person assist   Additional Comments SPT with rolling walker, small steps, pivots on heel. Pt required sequencing and verbal cues to complete. Transferred towards his left side. After toileting, pt stood and the gerichair was brought up behind him. Pt required 2 assist to scoot back in the gerichair.    Balance   Sitting - Static Independent ;Unsupported   Sitting - Dynamic Independent;Unsupported   Standing - Static Minimal Assist;Supported   Standing - Dynamic Not Tested   Standing Tolerance during Functional Task Fair   Cognition   Cognition No deficit noted   UE Assessment   UE Assessment Full AROM RUE;Full AROM LUE   Assessment   Assessment Patient making progress, continue with plan of care   Recommendations   Recommendations Skilled Nursing Facility Rehab   Multidisciplinary Communication   Multidisciplinary Communication nursing, PT   Time Calculation   OT  Timed Codes 26   OT Untimed Codes 0   OT Unbilled Time 0   OT Total Treatment 26   OT Charges   $HH OT Charges Selfcare Mgmt and Train (15 min);Therapeutic Activity ([address]   Kathline Magic, OTR/L  Pager [phone]

## 2015-03-02 NOTE — Progress Notes (Addendum)
Physical Therapy    Treatment session completed.         03/02/15 1500   PT Tracking   PT TRACKING PT Assigned   Visit Number   Visit Number 4   Precautions/Observations   Weight Bearing Status RLE WBAT   LDA Observation None   Fall Precautions General falls precautions   Pain Assessment   *Is the patient currently in pain? Yes   Pain (Before,During, After) Therapy During   0-10 Scale 4   Pain Intervention(s) Repositioned   Transfers   Sit to Stand Not tested   Stand to sit Not tested   Additional comments pt did not leave chair because he used restroom and transferred to chair 10 minutes prior to therapy arrival   Therapeutic Exercises   Ankle Pumps Bilaterally   Ankle Pumps-Reps 20   Glut Sets-Reps 20   Heel Slides, Supine Right   Heel Slides, Supine-Assist Active assisted   Heel Slides, Supine-Reps 20   Hip Abduction, Supine Right   Hip Abduction, Supine-Assist Active assisted   Hip Abduction, Supine-Reps 15   Hip Flexion, Sitting Right   Hip Flexion, Sitting-Assist Active assisted   Hip Flexion, Sitting-Reps 15   Knee Extension, Sitting (LAQ) Right   Knee Ext, Sitting (LAQ)-Assist Active assisted   Knee Ext, Sitting (LAQ)-Reps 15   Knee Extension, Supine (SAQ) Right   Knee Ext, Supine (SAQ)-Assist Active assisted   Knee Ext, Supine (SAQ)-Reps 10   Quad Sets Bilaterally   Quad Sets-Reps 20   Balance   Sitting - Static Supported  (Supervision)   Sitting - Dynamic Supported  (Supervision)   Assessment   Brief Assessment Remains appropriate for skilled therapy   Plan/Recommendation   Treatment Interventions Restorative PT   PT Frequency 5-7x/wk;30 minute sessions   Hospital Stay Recommendations Out of bed with nursing assist   Discharge Recommendations Skilled Nursing Facility Rehab   PT Discharge Equipment Recommended None   Assessment/Recommendations Reviewed With: Nursing;Family;Patient   Time Calculation   PT Timed Codes 10   PT Untimed Codes 0   PT Unbilled Time 0   PT Total Treatment 10   PT Charges   [facility] PT  Charges Therapeutic Exercise (15 min)     Barth Kirks SPTA       I was present during the entire physical therapy treatment.  I have reviewed and agreed with the physical therapy assistant student's treatment notes as well as patient education as documented in the chart.  Gevena Barre, PTA

## 2015-03-03 DIAGNOSIS — M25552 Pain in left hip: Secondary | ICD-10-CM | POA: Diagnosis not present

## 2015-03-03 DIAGNOSIS — J90 Pleural effusion, not elsewhere classified: Secondary | ICD-10-CM | POA: Diagnosis not present

## 2015-03-03 DIAGNOSIS — K5900 Constipation, unspecified: Secondary | ICD-10-CM | POA: Diagnosis not present

## 2015-03-03 DIAGNOSIS — J189 Pneumonia, unspecified organism: Secondary | ICD-10-CM | POA: Diagnosis not present

## 2015-03-03 DIAGNOSIS — M25551 Pain in right hip: Secondary | ICD-10-CM | POA: Diagnosis not present

## 2015-03-03 DIAGNOSIS — I482 Chronic atrial fibrillation: Secondary | ICD-10-CM | POA: Diagnosis not present

## 2015-03-03 DIAGNOSIS — R339 Retention of urine, unspecified: Secondary | ICD-10-CM | POA: Diagnosis not present

## 2015-03-03 DIAGNOSIS — J9811 Atelectasis: Secondary | ICD-10-CM | POA: Diagnosis not present

## 2015-03-03 DIAGNOSIS — Z9181 History of falling: Secondary | ICD-10-CM | POA: Diagnosis not present

## 2015-03-03 DIAGNOSIS — B351 Tinea unguium: Secondary | ICD-10-CM | POA: Diagnosis not present

## 2015-03-03 DIAGNOSIS — I10 Essential (primary) hypertension: Secondary | ICD-10-CM | POA: Diagnosis not present

## 2015-03-03 DIAGNOSIS — S7221XD Displaced subtrochanteric fracture of right femur, subsequent encounter for closed fracture with routine healing: Secondary | ICD-10-CM | POA: Diagnosis not present

## 2015-03-03 DIAGNOSIS — M79674 Pain in right toe(s): Secondary | ICD-10-CM | POA: Diagnosis not present

## 2015-03-03 DIAGNOSIS — I25810 Atherosclerosis of coronary artery bypass graft(s) without angina pectoris: Secondary | ICD-10-CM | POA: Diagnosis not present

## 2015-03-03 DIAGNOSIS — I509 Heart failure, unspecified: Secondary | ICD-10-CM | POA: Diagnosis not present

## 2015-03-03 DIAGNOSIS — R296 Repeated falls: Secondary | ICD-10-CM | POA: Diagnosis not present

## 2015-03-03 DIAGNOSIS — M79675 Pain in left toe(s): Secondary | ICD-10-CM | POA: Diagnosis not present

## 2015-03-03 DIAGNOSIS — W19XXXD Unspecified fall, subsequent encounter: Secondary | ICD-10-CM | POA: Diagnosis not present

## 2015-03-03 DIAGNOSIS — I447 Left bundle-branch block, unspecified: Secondary | ICD-10-CM | POA: Diagnosis not present

## 2015-03-03 DIAGNOSIS — N390 Urinary tract infection, site not specified: Secondary | ICD-10-CM | POA: Diagnosis not present

## 2015-03-03 DIAGNOSIS — R609 Edema, unspecified: Secondary | ICD-10-CM | POA: Diagnosis not present

## 2015-03-03 DIAGNOSIS — R918 Other nonspecific abnormal finding of lung field: Secondary | ICD-10-CM | POA: Diagnosis not present

## 2015-03-03 DIAGNOSIS — R2681 Unsteadiness on feet: Secondary | ICD-10-CM | POA: Diagnosis not present

## 2015-03-03 MED ORDER — APIXABAN 2.5 MG PO TABS *I*
2.5000 mg | ORAL_TABLET | Freq: Two times a day (BID) | ORAL | Status: DC
Start: 2015-03-03 — End: 2015-03-03
  Administered 2015-03-03: 2.5 mg via ORAL
  Filled 2015-03-03: qty 1

## 2015-03-03 MED ORDER — ATORVASTATIN CALCIUM 20 MG PO TABS *I*
20.0000 mg | ORAL_TABLET | Freq: Every day | ORAL | Status: DC
Start: 2015-03-03 — End: 2015-05-16

## 2015-03-03 MED ORDER — METOPROLOL TARTRATE 37.5 MG PO TABS
37.5000 mg | ORAL_TABLET | Freq: Two times a day (BID) | ORAL | Status: DC
Start: 2015-03-03 — End: 2015-05-16

## 2015-03-03 NOTE — Plan of Care (Signed)
Hct improved to 30. Will discontinue Lovenox and restart home Eliquis 2.5mg  BID.    Baldemar Lenis, PA

## 2015-03-03 NOTE — Progress Notes (Signed)
Inpatient Geriatric Medicine Progress Note    Interval History: No events overnight. Comfortable this am. Still with heart rates slightly high low 100s/high 90s - bps stable.    Intake/Output    Intake/Output Summary (Last 24 hours) at 03/03/15 1302  Last data filed at 03/03/15 1000   Gross per 24 hour   Intake    480 ml   Output   1700 ml   Net  -1220 ml        Vital Signs:   Temp:  [36 C (96.8 F)-37.4 C (99.3 F)] 37.4 C (99.3 F)  Heart Rate:  [88-101] 100  Resp:  [16-18] 18  BP: (105-125)/(62-78) 105/62       Physical Exam:    General: elderly male pleasant and cooperative  HEENT: ncat  Cardiovascular: slight tachycardic   Pulmonary: very mild wheeze throughout  Gastrointestinal: soft nt  EXT: no edema    Data:      Recent Labs  Lab 03/02/15  1645 03/02/15  1430 03/01/15  1828  02/28/15  1645  02/25/15  1137   WBC  --   --   --   --   --   --  7.6   HEMOGLOBIN 9.6*  --  9.4*  --  8.6*  < > 12.7*   HEMATOCRIT 30* 30* 29*  < > 26*  < > 40   PLATELETS  --   --   --   --   --   --  273   < > = values in this interval not displayed.  Other Labs:      Recent Labs  Lab 03/02/15  1645 03/01/15  1716 02/28/15  1645   SODIUM 137 133 133   POTASSIUM 4.8 5.1 4.7   CHLORIDE 103 100 99   CO2 UN 26* 30* 37*   CREATININE 0.88 0.93 1.11   GFR,CAUCASIAN 71 67 54*   GFR,BLACK 82 78 63   GLUCOSE 92 104* 119*   CALCIUM 8.9 9.3 8.7      X-rays the past 24 hours: No results found.     Assessment/Plan  Active Hospital Problems    Diagnosis    R IMN 02/26/15    HTN (hypertension)    A-fib    CAD (coronary artery disease)     S/p CABG      CHF (congestive heart failure)      Resolved Hospital Problems    Diagnosis   No resolved problems to display.     This is a 68M with hx of CAD s/p CABG, CHF, HTN, Afib on Eliquis who presented s/p fall, found to have right hip frax - s/p Sx:    S/P Fall with Right Hip Frax - POD 5:   -Pain Control - with ATC Tylenol, PRN Oxycodone, Morphine  -Acute Blood Loss  Anemia - Hct stable  -BM regimen - having BMs  -On DVT prophylaxis -- on Eliquis at baseline - as per Ortho  -Tolerating diet     Hypotension - improving  -Has received fluid boluses & PRBCs  -Monitor H&H - stable  -Increased BB from  BID to 37.5mg  BID     Acute Kidney Injury in setting of post-op/low bps/hypovolemia:  -Creatinine - improving    Urinary Retention s/p Foley Cath placement:  -Voiding trial at SNF    Hx of CAD s/p CABG:  -Continue BB  -not on Aspirin    Afib - rate controlled:  -  Increased Lopressor dose - more room for improvement as original dose is  daily  -Eliquis held as per Ortho    CHF --chronic - likely diastolic but EF unknown:  -Lasix & Potassium tabs held - can be restarted as per facility provider as vitals improve    Dispo: discharge today.  Can continue to increase Lopressor dosage back to original as BPs improve and Heart rate necessitates.  Overall volume status was euvolemic while inpatient - no fluid overload appreciated. Can restart Lasix/Potassium as Facility Provider sees fit based on ongoing course in Rehab.    Jaline Pincock, DO   03/03/2015   1:02 PM

## 2015-03-03 NOTE — Progress Notes (Signed)
Report was given to nurse at the facility. Pt is ready for discharge and hemodynamically stable. Darnelle Bos, RN

## 2015-03-03 NOTE — Comprehensive Assessment (Signed)
03/03/15 0839   Facility Choices    Discharge Facility [facility]   Date Accepted 03/02/15   Type of Bed Rehab Bed   pt was offered a bed at [facility]. pts dtr accepts this bed. Sw will follow.  Brantley Persons LMSW  8:39 AM  03/03/2015

## 2015-03-03 NOTE — Progress Notes (Signed)
Orthopaedic Surgery Progress Note  5 Days Post-Op    Patient: Jonathan Cisneros   MRN: [id number]   DOA: 02/25/2015   Attending: Arvil Persons, MD    Subjective: No acute events overnight.  No pain, slept through night. Denies numbness/tingling.  Denies F/C, CP/SOB, lightheadedness. Working well with PT.    Objective:  Temp:  [36 C (96.8 F)-37.6 C (99.7 F)] 37.4 C (99.3 F)  Heart Rate:  [88-104] 100  Resp:  [16-18] 18  BP: (107-125)/(65-78) 125/73  Vitals:    03/03/15 0203   BP: 125/73   Pulse: 100   Resp: 18   Temp: 37.4 C (99.3 F)   Weight:    Height:        Intake/Output Summary (Last 24 hours) at 03/03/15 0559  Last data filed at 03/03/15 0203   Gross per 24 hour   Intake    300 ml   Output   2475 ml   Net  -2175 ml       Recent Labs  Lab 03/02/15  1645 03/02/15  1430 03/01/15  1828  02/28/15  1645  02/25/15  1137   WBC  --   --   --   --   --   --  7.6   HEMOGLOBIN 9.6*  --  9.4*  --  8.6*  < > 12.7*   HEMATOCRIT 30* 30* 29*  < > 26*  < > 40   PLATELETS  --   --   --   --   --   --  273   < > = values in this interval not displayed.    Recent Labs  Lab 03/02/15  1645 03/01/15  1716 02/28/15  1645   SODIUM 137 133 133   POTASSIUM 4.8 5.1 4.7   CO2 UN 26* 30* 37*   CREATININE 0.88 0.93 1.11   GLUCOSE 92 104* 119*   CALCIUM 8.9 9.3 8.7       Recent Labs  Lab 02/25/15  1137   INR 1.4*   PROTIME 16.5*       No components found with this basename: APTT    Physical Exam:  AOx3, NAD, supine in bed  Tachycardic, Unlabored breathing  Abdomen soft, non tender    RLE:   Hip dressing clean, dry, intact without ss  Motor: + ankle DF/PF, toe flex/ext  Sensory: SILT 1st dorsal web space/medial/lateral/plantar/dorsal foot  Ciculation: 2+ DP/PT pulses, toes wwp    Assessment/Plan:   82 y.o. male admitted 02/25/2015, POD5 R TFN, foley placed for retention    -  Hct 30, stable  -  WBAT RLE, ice/elevate   - PT/OT/OOB  -  Imaging:  adequate  - Diet:  Geri  - Bowel regimen  - Pain control  - DVT ppx: lovenox, will be put  back on eliquis for Afib when hct trends back up  - Creatinine continuing to down trend, small boluses as needed  - Foley for rentention  - Dispo: SNF      Graylon Gunning, MD   Orthopaedic Surgery Resident  03/03/2015 5:59 AM

## 2015-03-03 NOTE — Discharge Instructions (Addendum)
[facility] TFN1/15/2017 for Closed displaced subtrochanteric fracture of right femur, initial encounter    1. Patient should be weight bearing as tolerated on the operative extremity    2. Activity: Patients should not lift, carry, push, or pull heavy objects. They should not exercise, drive, or work but other activities, including stairs, may be as tolerated. Patient should elevate the extremity, place ice to the area, and keep your wound clean and dry with a sterile dressing.     3. Diet: Geriatric as tolerated    4. DVT prevention: Home Eliquis was restarted at discharge as HCT has stabilized and began an upward trend.    5. Labs: None. Metabolic bone labs (Vitamin D, TSH, Albumin, PTH) may be pending at the time of discharge and should be follow-up by your primary care physician.     6. Patient or care giver should contact Dr. Idamae Lusher if they experience any of the following: fever, chills, leg swelling, numbness, wound redness or drainage, or pain not relieved by medication. In the event they are unable to contact the office, please go to [facility] Emergency Department for evaluation.    7. Follow-up: Patients should contact the surgeons office for a follow-up appointment at [phone].    8. Consider referral to Metabolic Bone Clinic, DEXA scan, and Bisphosphonate therapy if appropriate.     9. Continue to follow Vitamin D levels.    10. Consider Calcium Citrate  daily and 2000 units of Vitamin D.     11. Surgical staples/sutures should be removed 2 weeks post op by surgeons office or nursing services.     12. Dressing can be changed as needed, if soiled or saturated. Should be kept clean dry and in place.     13. Foley catheter was placed with persistent urinary retention on 1/17. Void trail will have to completed in ~1 week from placement. Call Dr. Lorenda Cahill office if questions or concerns.    14. Due to hypotension metoprolol was reduced to 37.5mg  daily. This can be titrated up as needed.  Lasix, valsartan, and amlodipine were all held, and were not restarted at discharge. These can be resumed as needed.

## 2015-03-03 NOTE — Continuity of Care (Signed)
NEW [facility] DEPARTMENT OF HEALTH  [facility]-Division of Quality and Surveillance for Nursing Homes and ICFs/MR  Patient Name  Jonathan Cisneros MR Number  [id number] Account Number  [id number] Birthdate  [id number]    Hospital and Community                Patient Review Instrument  (HC-PRI)               RUG II:  RB5     I. ADMINISTRATIVE DATA     1. Operating Certificate Number  [id number] 2. Social Security Number  [id number]   3. Official Name of Hospital Completing this Review  [facility]    4A. Patient Name  Jonathan Cisneros 10. Sex  male   4B. [place]idence  [place] 11A. Date of Hospital Admission or Initial Agency Visit  02/25/2015   5. Date of [facility] Completion  March 03, 2015 11B. Date of Alternate level of Care Status in Hospital (if applicable)    6. Account Number  [id number] 12. Medicaid Number  MEDICARE,    MCR,    [id number]  MEDICARE 1500,    MCR,    [id number]  BLUE CROSS,    BCBS,    [id number]       7. Hospital Room Number  W621/W621-02 13. Medicare Number     8. Name of Unit/Division/Building  W621/W621-02 14. Primary Payor     9. Date of Birth  [phone] 57. Reason for [facility] Completion  1 - RHCF application from hospital     II. MEDICAL EVENTS     16. Decubitus Level:        Location:  No reddened skin or breakdown     17. MEDICAL CONDITIONS During the past week:  1=Yes  2=No 18. MEDICAL TREATMENTS 1=Yes  2=No   A. Comatose No A. Tracheostomy Care/Suct No         B. Dehydration No B. Suctioning/General No         C. Internal Bleeding No C. Oxygen (Daily) No              D. Stasis Ulcer No D Respiratory Care (Daily) No         E. Terminally Ill No E. Nasal Gastric Feeding No         F. Contractures No F. Parenteral Feeding No         G. Diabetes Mellitus No G. Wound Care No         H. Urinary Tract Infection No H. Chemotherapy No         I. HIV Infection Symptomatic No I. Transfusion No         J. Accident Yes (fall pta, Rt Hip FX) J. Dialysis No         K. Ventilator Dependent No K. Bowel and Bladder Rehab No            L. Catheter (Indwelling or External) No              M. Physical Restraints  (Daytime) No             Adapted from DOH-694    Version: 002F  Review Type: Update review  03/03/2015  2 of 7    NEW [facility] DEPARTMENT OF HEALTH  [facility]-Division of Quality and Surveillance for Nursing Homes and ICFs/MR  Patient Name  Jonathan Cisneros MR Number  [id number] Account Number  [id number] [facility]  [id number]    [facility] [employer]  Patient Review Instrument  (HC-PRI)               III. Activities of Daily Living     19. Eating 1-Feeds self without supervision or physical assistance.  May use adaptive equipment.    20. Mobility 5-Is wheeled, chairfast or bedfast. Relies on someone else to move about, if at all.    21. Transfer 3-Requires one person to provide constant guidance, steadiness and/or physical assistance.  Patient may participate in transfer.    22. Toileting 3-Continent of bowel and bladder.  Requires constant supervision and/or physical assistance with major/all parts of the task, including appliances (i.e., colostomy, ilieostomy, urinary catheter).      IV. BEHAVIORS     23. Verbal Disruption No known history   24. Physical Agression No known history   25. Disruptive, Infantile or Socially Inappropriate Behavior No known history   26. Hallucinations No     V. SPECIALIZED SERVICES     27A. Physical Therapy  27B. Occupational Therapy    Level: Restorative therapy- requires and is currently receiving physical and/or occupational therapy for the past week Level: Receives therapy, but does not fulfill the qualifiers stated in the instructions.   Actual days/week:4 Yes - equal or greater than 5 times/week Actual days/week::3 No - less than 5 times/wk   Actual hours/week:1.8 Yes - equal to or greater than 2.5 hrs/wk Actual hours/week:1.2 No - less than 2.5 hrs/wk     28. Number of Physician Visits - 0     VI. DIAGNOSIS     29. Primary Problem: Closed displaced subtrochanteric fracture of right femur,  initial encounter   S/p Rt IMN 1/15     VII. PLAN OF CARE SUMMARY     30. Primary Diagnosis: Encounter Diagnosis   Name Primary?    Closed displaced subtrochanteric fracture of right femur, initial encounter Yes   s/p RT IMN 1/15         PMH: See below         PSH:  has a past surgical history that includes Appendectomy; Cataract removal with implant (Bilateral); Coronary artery bypass graft (02/13/2003); and orthopedic surgery.   31A. Rehabilitation Potential: Treatment Interventions: Restorative PT  PT Frequency: 5-7x/wk, 30 minute sessions  Hospital Stay Recommendations: Out of bed with nursing assist  Discharge Recommendations: Skilled Nursing Facility Rehab  PT Discharge Equipment Recommended: None  Assessment/Recommendations Reviewed With:: Nursing, Family, Patient.    OT Frequency: 3-5x/wk  Patient Will Benefit From: ADL retraining, Functional transfer training, UE strengthening/ROM, Endurance training, Patient/Family training, Compensatory technique education, Exercises.    OT Discharge Recommendations: Skilled Nursing [facility] Stay Recommendations: Encourage OOB and independence with ADL's. two assist SPT to bedside commode or use hand held urinal/bed pan.            Comment:    31B. Current therapy care plan: Treatment Interventions: Restorative PT  PT Frequency: 5-7x/wk, 30 minute sessions  Hospital Stay Recommendations: Out of bed with nursing assist  Discharge Recommendations: Skilled Nursing Facility Rehab  PT Discharge Equipment Recommended: None  Assessment/Recommendations Reviewed With:: Nursing, Family, Patient            Comment:    93. Medications: See below   33A. Allergies: Penicillins   33B: Treatments: Rolling Walker   33C: Abnormal Labs: See below   33D: Precautions:  Falls  Skin  Aspiration           Comment:    33E: Pacemaker  19F: Diet: Diet geriatric          34. Race/Ethnic Group White or Caucasian [1]     35. Certification    I have personally observed/interviewed  this patient and completed this H/C PRI. No - Staff/Chart           I certify that the information contained herein is a true abstract of this patient's condition and medical record. Yes   Certified Assessor: Dorina Hoyer, RN   Identification Number: [id number]             Adapted from UEA-540    Version: 002F  Review Type: Update review  03/03/2015  2 of 1    PMH:    Past Medical History   Diagnosis Date    Arthritis      osteoarthritis    Atrial fibrillation     Atrial flutter     CHF (congestive heart failure)      chronic systolic heart failure, LVEF 30% on echo 03/10/2013    CHF (congestive heart failure)     Coronary artery disease      LM and 3 vessel, s/p CABG 02/13/2003    Hyperlipidemia     Hypertension     LBBB (left bundle branch block)        Medications:    Current Facility-Administered Medications   Medication Dose Route Frequency    apixaban (ELIQUIS) tablet 2.5 mg  2.5 mg Oral Q12H    metoprolol (LOPRESSOR) PO 37.5 mg  37.5 mg Oral 2 times per day    sodium chloride 0.9 % IV  3 mL/hr Intravenous Continuous    pantoprazole (PROTONIX) EC tablet 40 mg  40 mg Oral QAM    polyethylene glycol (GLYCOLAX/MIRALAX) powder 17 g  17 g Oral 2 times per day    docusate sodium (COLACE) capsule 200 mg  200 mg Oral 2 times per day    senna (SENOKOT) tablet 2 tablet  2 tablet Oral Daily    bisacodyl (DULCOLAX) suppository 10 mg  10 mg Rectal Daily PRN    calcium citrate-vitamin D (CITRACAL+D) 315-250 MG-UNIT per tablet 1 tablet  1 tablet Oral BID    cholecalciferol (VITAMIN D) tablet TABS 2,000 Units  2,000 Units Oral Daily    acetaminophen (TYLENOL) tablet 1,000 mg  1,000 mg Oral 3 times per day    haloperidol (HALDOL) tablet 0.5 mg  0.5 mg Oral Q6H PRN    ondansetron (ZOFRAN) injection 4 mg  4 mg Intravenous Q6H PRN    oxyCODONE (ROXICODONE) IR tablet 2.5 mg  2.5 mg Oral Q2H PRN    Or    oxyCODONE (ROXICODONE) IR tablet 5 mg  5 mg Oral Q2H PRN    morphine 2 mg/mL injection 2 mg  2 mg Intravenous  Q2H PRN    Or    morphine 4 MG/ML injection 4 mg  4 mg Intravenous Q2H PRN    nalbuphine (NUBAIN) injection 3 mg  3 mg Intravenous Q3H PRN    camphor-menthol (SARNA) lotion   Topical PRN    atorvastatin (LIPITOR) tablet 20 mg  20 mg Oral Daily    calcium carbonate (TUMS) chewable tablet 500 mg  500 mg Oral 2 times per day       Abnormal Labs:    Recent Results (from the past 72 hour(s))   HCT and Hgb    Collection Time: 02/28/15  4:45 PM   Result Value Ref Range    Hemoglobin 8.6 (L)  13.7 - 17.5 g/dL    Hematocrit 26 (L) 40 - 51 %   Basic metabolic panel    Collection Time: 02/28/15  4:45 PM   Result Value Ref Range    Glucose 119 (H) 60 - 99 mg/dL    Sodium 161 096 - 045 mmol/L    Potassium 4.7 3.3 - 5.1 mmol/L    Chloride 99 96 - 108 mmol/L    CO2 21 20 - 28 mmol/L    Anion Gap 13 7 - 16    UN 37 (H) 6 - 20 mg/dL    Creatinine 4.09 8.11 - 1.17 mg/dL    GFR,Caucasian 54 (A) *    GFR,Black 63 *    Calcium 8.7 8.6 - 10.2 mg/dL   MCHC    Collection Time: 02/28/15  4:45 PM   Result Value Ref Range    MCHC 33 32 - 37 g/dL   Hematocrit    Collection Time: 03/01/15  5:49 AM   Result Value Ref Range    Hematocrit 23 (L) 40 - 51 %   MCHC    Collection Time: 03/01/15  5:49 AM   Result Value Ref Range    MCHC 32 32 - 37 g/dL   Red blood cells    Collection Time: 03/01/15  8:56 AM   Result Value Ref Range    Component blood type O Pos     Unit Number [id number]     Dispense status Transfused     Product code [id number]     Blood product type Red Blood Cells     Coding system [id number]     Coded Blood type 5100    Basic metabolic panel    Collection Time: 03/01/15  5:16 PM   Result Value Ref Range    Glucose 104 (H) 60 - 99 mg/dL    Sodium 629 528 - 413 mmol/L    Potassium 5.1 3.3 - 5.1 mmol/L    Chloride 100 96 - 108 mmol/L    CO2 22 20 - 28 mmol/L    Anion Gap 11 7 - 16    UN 30 (H) 6 - 20 mg/dL    Creatinine 2.44 0.10 - 1.17 mg/dL    GFR,Caucasian 67 *    GFR,Black 78 *    Calcium 9.3 8.6 - 10.2 mg/dL   Hemoglobin     Collection Time: 03/01/15  6:28 PM   Result Value Ref Range    Hemoglobin 9.4 (L) 13.7 - 17.5 g/dL   Hematocrit    Collection Time: 03/01/15  6:28 PM   Result Value Ref Range    Hematocrit 29 (L) 40 - 51 %   MCHC    Collection Time: 03/01/15  6:28 PM   Result Value Ref Range    MCHC 32 32 - 37 g/dL   Hematocrit    Collection Time: 03/02/15  2:30 PM   Result Value Ref Range    Hematocrit 30 (L) 40 - 51 %   MCHC    Collection Time: 03/02/15  2:30 PM   Result Value Ref Range    MCHC 33 32 - 37 g/dL   Basic metabolic panel    Collection Time: 03/02/15  4:45 PM   Result Value Ref Range    Glucose 92 60 - 99 mg/dL    Sodium 272 536 - 644 mmol/L    Potassium 4.8 3.3 - 5.1 mmol/L    Chloride 103 96 - 108 mmol/L  CO2 21 20 - 28 mmol/L    Anion Gap 13 7 - 16    UN 26 (H) 6 - 20 mg/dL    Creatinine 1.61 0.96 - 1.17 mg/dL    GFR,Caucasian 71 *    GFR,Black 82 *    Calcium 8.9 8.6 - 10.2 mg/dL   HCT and Hgb    Collection Time: 03/02/15  4:45 PM   Result Value Ref Range    Hemoglobin 9.6 (L) 13.7 - 17.5 g/dL    Hematocrit 30 (L) 40 - 51 %   MCHC    Collection Time: 03/02/15  4:45 PM   Result Value Ref Range    MCHC 32 32 - 37 g/dL

## 2015-03-03 NOTE — Progress Notes (Signed)
SW confirmed with medical team that pt is medically stable and safe for discharge to Short term rehab today. SW contacted pt and family to inform of discharge time 11:30am going to [facility] via [facility] wheelchair. Family and pt were informed there will be a fee for transport that will be billed to pt and/or family. Pt and family in agreement. SW requested updated PRI at this time. SW informed home care agency [facility] , who is following pt at this time. SW informed the placement office of the time and method of transport. SW informed RN, Media planner, Press photographer, and pt at this time. SW will continue to be involved for any further discharge needs.      Nurse report number is [phone]  Brantley Persons LMSW  9:38 AM  03/03/2015

## 2015-03-03 NOTE — Progress Notes (Signed)
[facility]  Transportation Request Form / Physician Certification Statement   Please fax request with face sheet to RMT: Fax [phone], Phone [phone] or [employer] Fax [phone], Phone [phone]    Patient Name:  Jonathan Cisneros     Date of Birth:  10-Apr-1913    Date of Service: 03/03/15  Time of pick up: 11:30am Patient Location:  W621/W621-02       MR#  [id number] Patient Destination: [facility]   Number of steps into house?: private  Requestors Name: Brantley Persons  Call Back Number: [phone]  Payor : private  Transport for (chech what type of treatment or service, at least one):     Discharge               Diagnostic Test       Evaluation Test          Procedure      Specify what type of treatment or service: short term rehab   Is this treatment or service available at sending facility?:       Yes     No  Requested Mode of Transport   One man Chairmobile  Two man Chairmobile  Leg extension required:   Yes   No Patient has own chair:  Yes     No  Wheelchair size:    Standard  X-wide  XX-wide   Round Trip  One Way   Stretcher Zenaida Niece (no medical attention needed)     [facility]  [facility]  Critical Care  VENDOR: ___RMT_____   1. Medical condition that necessitates this mode of transport (i.e. oxygen, bed ridden, etc.):  Pt is unable to safely ambulate independently at this time due to injury.   2. What medical services are to be provided by crew?    Oxygen Monitoring:  Amount: ___ LPM  Can patient self-administer oxygen?   Yes  No - What is limitation? __________  Does patient have own portable tank?     Yes      No    Airway Monitoring  Monitor IV infusion  Suctioning    Cardiac Monitoring    Vital Signs  PRN Meds   Other_______________________   None    3. Infection control needs (i.e. ORSA/VRE/Cdiff)        Yes     No  4. What specific handling is required?     Positioning  Orthopedic Device      Restraints  Other: __________  Reason for above:   Flight Risk      Severe Pain  Morbid Obesity        Fall Precaution    Ortho/Spine Precaution          Decubitis > Stage 2  Risk of injury to self/others  5.  Patient mental status?  Normal cognition  Disoriented         Psychological Disorder   Specify: ___________________  6. At time of transport is bed confinement ordered?   Yes      No              Is patient bed confined?     Yes      No    Medical condition for bed confinement:      MD;  Nurse;  Discharge Planner;    Social Worker  Name:  Brantley Persons         Date:  March 03, 2015  Signature:___________________________________________________

## 2015-03-03 NOTE — Discharge Summary (Signed)
Name: Jonathan Cisneros MRN: [id number] DOB: Jun 25, 1913     Admit Date: 02/25/2015   Date of Discharge: 03/03/2015    Patient was accepted for discharge to   To SNF (Skilled Nursing) [3]  Discharge Facility: [facility]        Discharge Attending Physician: Lossie Faes D      Hospitalization Summary    CONCISE NARRATIVE:   [name] is a 82 y.o.  who sustained a fall and was admitted and underwent Right IMN for surgical stabilization of fracture. They underwent evaluation, risk stratification and was comanaged by Geriatrics. Waited 24 hr after eliquis dosing. The patient was transfused during the hospital stay for acute post-op blood loss anemia. He was straight cathed for retention. Serial volumes of 400, 677, and 548, and hypotensive therefore not amendable to oral agents, a foley was replaced for persistent retention. Patient was discharged to SNF. He should undergo a void trial 7-10 days from foley placement. As this pt was comanaged by geriatrics please review last note by geriatric team for detailed summarization.           OR PROCEDURE: Right IMN for right hip fracture 02/26/15  SIGNIFICANT MED CHANGES: Yes  Chemical DVT prophylaxis was initiated. Detailed in [facility]. Analgesics tailored to tolerance and adequate pain management.     Eliquis held until hct stable and improving  Lopressor changed to 50bid for dosing control, then increased to 37.5 bid  Valsartan and amlodipine held as well as lasix and potassium    Eliquis can be initiated at discharge.        CONSULTANT SERVICE    Geriatric Medicine                  Signed: Tonny Branch Carnelia Oscar, PA  On: 03/03/2015  at: 10:05 AM

## 2015-03-09 DIAGNOSIS — I509 Heart failure, unspecified: Secondary | ICD-10-CM | POA: Diagnosis not present

## 2015-03-10 ENCOUNTER — Telehealth: Payer: Self-pay | Admitting: Cardiology

## 2015-03-10 NOTE — Telephone Encounter (Signed)
Spoke to [place] at the [facility] as below and faxed copy of this correspondence to [phone].    JDeans RN

## 2015-03-10 NOTE — Telephone Encounter (Signed)
Spoke with [facility]  Dental appt not scheduled yet.  Single tooth extraction but dentist has requested hold on Eliquis if possible.       Dr Margarette Asal advise?                                                      JDeans RN

## 2015-03-10 NOTE — Telephone Encounter (Signed)
Patient is scheduled to have tooth extraction and needs to stop eliqoius prior.

## 2015-03-14 DIAGNOSIS — R918 Other nonspecific abnormal finding of lung field: Secondary | ICD-10-CM | POA: Diagnosis not present

## 2015-03-14 DIAGNOSIS — J90 Pleural effusion, not elsewhere classified: Secondary | ICD-10-CM | POA: Diagnosis not present

## 2015-03-14 DIAGNOSIS — J9811 Atelectasis: Secondary | ICD-10-CM | POA: Diagnosis not present

## 2015-03-15 DIAGNOSIS — J189 Pneumonia, unspecified organism: Secondary | ICD-10-CM | POA: Diagnosis not present

## 2015-03-20 DIAGNOSIS — R609 Edema, unspecified: Secondary | ICD-10-CM | POA: Diagnosis not present

## 2015-03-20 DIAGNOSIS — K5900 Constipation, unspecified: Secondary | ICD-10-CM | POA: Diagnosis not present

## 2015-03-20 DIAGNOSIS — R339 Retention of urine, unspecified: Secondary | ICD-10-CM | POA: Diagnosis not present

## 2015-03-27 ENCOUNTER — Encounter: Payer: Self-pay | Admitting: Gastroenterology

## 2015-04-12 ENCOUNTER — Telehealth: Payer: Self-pay | Admitting: Urology

## 2015-04-12 ENCOUNTER — Encounter: Payer: Self-pay | Admitting: Gastroenterology

## 2015-04-12 NOTE — Telephone Encounter (Signed)
Jonathan Cisneros called to make an appointment with Dr.gentile or Jonathan Cisneros for next week as patient has urinary retention and needs to be seen next week for a catheter removal. Please call to advise at [phone] as no appointments were available per [facility].

## 2015-04-13 ENCOUNTER — Telehealth: Payer: Self-pay | Admitting: Urology

## 2015-04-13 NOTE — Telephone Encounter (Signed)
Spoke with Thayer Ohm.    Patient has been scheduled with Dr. Ron Agee on Monday 04/17/15 at 8:45 am at the [facility] office (okay per Belia Heman).  Notes will be faxed to [facility].

## 2015-04-13 NOTE — Telephone Encounter (Signed)
Jonathan Cisneros is calling again to have patient seen next week for urinary retention. She would like a call back today regarding this at [phone].

## 2015-04-13 NOTE — Telephone Encounter (Signed)
Thayer Ohm from [employer] is calling to cancel patients appointment which is currently scheduled for 3.6.17.    Has the appointment been cancelled? yes    Has the appointment been rescheduled? no    Does the patient need a call back to reschedule?no    Thayer Ohm can be contacted back at [phone]      *Thayer Ohm states patient is being discharged 3.7.17 and will not be able to make it to that appointment. Please refer to yesterdays encounter.

## 2015-04-14 ENCOUNTER — Telehealth: Payer: Self-pay | Admitting: Urology

## 2015-04-14 NOTE — Telephone Encounter (Signed)
Melissa:               Could you please help me find an appointment after 04/18/15 for this patient? New patient possible cysto  Thanks you

## 2015-04-14 NOTE — Telephone Encounter (Signed)
There is a cancellation slot on 3/8 @ 2:15PM, [place] that can be offered to the patient if he would like to be seen quickly

## 2015-04-14 NOTE — Telephone Encounter (Signed)
Please refer to closed 3/1 and 04/13/15 encounters. Jonathan Cisneros is following up on rescheduling urgent appt for 04/17/15 @ 8:45. She states that they needed the appt to be after his discharge on 04/18/15. Please advise and call Jonathan Cisneros at [phone]

## 2015-04-14 NOTE — Telephone Encounter (Signed)
Spoke to [place],  Scheduled appointment per message below. She confirmed.

## 2015-04-17 ENCOUNTER — Telehealth: Payer: Self-pay | Admitting: Urology

## 2015-04-17 ENCOUNTER — Ambulatory Visit: Payer: Self-pay | Admitting: Urology

## 2015-04-17 DIAGNOSIS — I509 Heart failure, unspecified: Secondary | ICD-10-CM | POA: Diagnosis not present

## 2015-04-17 DIAGNOSIS — S7221XD Displaced subtrochanteric fracture of right femur, subsequent encounter for closed fracture with routine healing: Secondary | ICD-10-CM | POA: Diagnosis not present

## 2015-04-17 DIAGNOSIS — N390 Urinary tract infection, site not specified: Secondary | ICD-10-CM | POA: Diagnosis not present

## 2015-04-17 NOTE — Telephone Encounter (Signed)
Jonathan Cisneros,Daughter is calling to see if it is possible to have her father's appointment on 04/19/15 to be moved to [place] with any provider with in the next week or so. If not she will keep the appointment for 04/19/15. Please call her back to discuss at [phone].

## 2015-04-17 NOTE — Telephone Encounter (Signed)
Please refer to closed 04/17/15 encounter. Patient's daughter Jenel LucksRoberta is following up on previous call. She did not receive Cindy's message. Writer confirmed [facility] Jenel LucksRoberta agreed but stated that she will see the patient today and confirm it with him. She may call back if appt is not agreeable with the patient.

## 2015-04-17 NOTE — Telephone Encounter (Signed)
Left message for Roberta.  Patient has been rescheduled with Dr. Ron AgeeGentile on Friday 04/21/15 [facility] 10[facility]    Asked patient to call back to confirm.

## 2015-04-18 ENCOUNTER — Ambulatory Visit: Payer: Self-pay | Admitting: Orthopedic Surgery

## 2015-04-18 ENCOUNTER — Other Ambulatory Visit: Payer: Self-pay

## 2015-04-19 ENCOUNTER — Telehealth: Payer: Self-pay | Admitting: Orthopedic Surgery

## 2015-04-19 ENCOUNTER — Ambulatory Visit: Payer: Self-pay | Admitting: Urology

## 2015-04-19 DIAGNOSIS — I110 Hypertensive heart disease with heart failure: Secondary | ICD-10-CM | POA: Diagnosis not present

## 2015-04-19 DIAGNOSIS — I4891 Unspecified atrial fibrillation: Secondary | ICD-10-CM | POA: Diagnosis not present

## 2015-04-19 DIAGNOSIS — R339 Retention of urine, unspecified: Secondary | ICD-10-CM | POA: Diagnosis not present

## 2015-04-19 DIAGNOSIS — M1990 Unspecified osteoarthritis, unspecified site: Secondary | ICD-10-CM | POA: Diagnosis not present

## 2015-04-19 DIAGNOSIS — I2510 Atherosclerotic heart disease of native coronary artery without angina pectoris: Secondary | ICD-10-CM | POA: Diagnosis not present

## 2015-04-19 DIAGNOSIS — Z960 Presence of urogenital implants: Secondary | ICD-10-CM | POA: Diagnosis not present

## 2015-04-19 DIAGNOSIS — I5022 Chronic systolic (congestive) heart failure: Secondary | ICD-10-CM | POA: Diagnosis not present

## 2015-04-19 DIAGNOSIS — S72001D Fracture of unspecified part of neck of right femur, subsequent encounter for closed fracture with routine healing: Secondary | ICD-10-CM | POA: Diagnosis not present

## 2015-04-19 DIAGNOSIS — I4892 Unspecified atrial flutter: Secondary | ICD-10-CM | POA: Diagnosis not present

## 2015-04-19 DIAGNOSIS — E785 Hyperlipidemia, unspecified: Secondary | ICD-10-CM | POA: Diagnosis not present

## 2015-04-19 DIAGNOSIS — Z9181 History of falling: Secondary | ICD-10-CM | POA: Diagnosis not present

## 2015-04-19 NOTE — Telephone Encounter (Signed)
Jonathan Cisneros from visiting nurses wanted to office to be aware of the med changes and that his blood pressure is low.     Not taking:  -Suppositiry, Colace, mirralax, pantoprazole, foremide    Taking:  Cipro, Flomax, Culturelle, torsemide, potassium    Med changes:  Netoprolol is now taking 50mg  2x day  Senna 1 tablet 2x day

## 2015-04-20 DIAGNOSIS — I5022 Chronic systolic (congestive) heart failure: Secondary | ICD-10-CM | POA: Diagnosis not present

## 2015-04-20 DIAGNOSIS — R339 Retention of urine, unspecified: Secondary | ICD-10-CM | POA: Diagnosis not present

## 2015-04-20 DIAGNOSIS — I4892 Unspecified atrial flutter: Secondary | ICD-10-CM | POA: Diagnosis not present

## 2015-04-20 DIAGNOSIS — I4891 Unspecified atrial fibrillation: Secondary | ICD-10-CM | POA: Diagnosis not present

## 2015-04-20 DIAGNOSIS — S72001D Fracture of unspecified part of neck of right femur, subsequent encounter for closed fracture with routine healing: Secondary | ICD-10-CM | POA: Diagnosis not present

## 2015-04-20 DIAGNOSIS — I110 Hypertensive heart disease with heart failure: Secondary | ICD-10-CM | POA: Diagnosis not present

## 2015-04-21 ENCOUNTER — Ambulatory Visit: Payer: Self-pay | Admitting: Urology

## 2015-04-21 ENCOUNTER — Encounter: Payer: Self-pay | Admitting: Urology

## 2015-04-21 VITALS — BP 94/50 | HR 81 | Ht 64.0 in | Wt 142.0 lb

## 2015-04-21 DIAGNOSIS — N138 Other obstructive and reflux uropathy: Secondary | ICD-10-CM

## 2015-04-21 DIAGNOSIS — R339 Retention of urine, unspecified: Secondary | ICD-10-CM

## 2015-04-21 DIAGNOSIS — N401 Enlarged prostate with lower urinary tract symptoms: Secondary | ICD-10-CM

## 2015-04-21 MED ORDER — TAMSULOSIN HCL 0.4 MG PO CAPS *I*
ORAL_CAPSULE | ORAL | 3 refills | Status: DC
Start: 2015-04-21 — End: 2016-04-25

## 2015-04-21 NOTE — Progress Notes (Signed)
Chief Complaint : Post-Operative Urinary Retention    HPI : Jonathan Cisneros is a 82 y.o. male who is being seen today, along with his daughter and his grand daughter's partner, for evaluation of post-operative urinary retention.  He fell on 03-27-15 and broke his right femur.  On 03-29-15 he underwent an "IM nail procedure," per his daughter.  He has been unable to void since.  He is here with a foley catheter, hoping to have that removed.  Prior to surgery, he was voiding with a good urinary stream.  He denied hesitancy.  He denied urgency.  He denied intermittency.  He reported no dysuria or hematuria.  He had on average 0-1 x nocturia.    He was satisfied with his voiding.  He is currently using flomax once daily, which was started post-operatively.        Medications : has a current medication list which includes the following prescription(s): torsemide, atorvastatin, metoprolol tartrate, eliquis, acetaminophen, docusate sodium, polyethylene glycol, senna, calcium citrate-vitamin d, and cholecalciferol.  Allergies :   Allergies   Allergen Reactions    Penicillins Rash                                                                                              Review of Systems   Constitutional: Negative.    HENT: Negative.    Eyes: Negative.    Respiratory: Negative.    Cardiovascular: Negative.    Gastrointestinal: Negative.    Endocrine: Negative.    Genitourinary: Negative.    Musculoskeletal: Negative.    Skin: Negative.    Allergic/Immunologic: Negative.    Neurological: Negative.    Hematological: Negative.    Psychiatric/Behavioral: Negative.        Past Medical History :   Past Medical History:   Diagnosis Date    Arthritis     osteoarthritis    Atrial fibrillation     Atrial flutter     CHF (congestive heart failure)     chronic systolic heart failure, LVEF 30% on echo 03/10/2013    CHF (congestive heart failure)     Coronary artery disease     LM and 3 vessel, s/p CABG 02/13/2003    Hyperlipidemia      Hypertension     LBBB (left bundle branch block)      Surgical History :  has a past surgical history that includes Appendectomy; Cataract removal with implant (Bilateral); Coronary artery bypass graft (02/13/2003); and orthopedic surgery.  Family History : family history is not on file.  Social History :  reports that he has never smoked. He does not have any smokeless tobacco history on file. He reports that he drinks alcohol.    Physical Exam   Constitutional: He is oriented to person, place, and time. He appears well-developed and well-nourished.   HENT:   Head: Normocephalic and atraumatic.   Right Ear: External ear normal.   Left Ear: External ear normal.   Nose: Nose normal.   Eyes: Conjunctivae and EOM are normal. Pupils are equal, round, and reactive to light.   Neck: Normal range of  motion. Neck supple. No JVD present.   Cardiovascular: Normal rate.    Pulmonary/Chest: Effort normal. No respiratory distress. He has no wheezes.   Abdominal: Soft. He exhibits no distension and no mass. There is no tenderness. There is no guarding.   Musculoskeletal: Normal range of motion.   Neurological: He is alert and oriented to person, place, and time.   Skin: Skin is warm and dry.   Psychiatric: He has a normal mood and affect. His behavior is normal. Judgment and thought content normal.     Penis - Uncircumcised.  Paraphimosis with diffuse penile edema.  Reduced.     Scrotum - normal, without lesions.     No intrascrotal masses appreciated.    No evidence of a spermatocele on either side    No evidence of a hydrocele on either side  Testes:   Right - normal in size, shape, consistency, orientation.  No masses evident.   Left - normal in size, shape, consistency, orientation.  No masses evident.  Vasa:   Right - present and normal   Left - present and normal   Epididymides:   Right - present and normal   Left - present and normal    Prostate:   1-2+ enlarged and benign feeling     Soft, smooth, symmetric, sharp,  distinct borders, without induration or nodularity.  Rectum:   No evidence of mass or blood.  Anal sphincter tone:   Normal  Bulbocavernosus reflex:    Normal    Femoral Pulses:   0-1+ bilaterally        PROCEDURE:   Bladder filled to 600 ccs   Unable to void    PROCEDURE #2:    Procedure:  Cystourethroscopy  Indication(s) Loraine Leriche with an X)   1.  Assess microhematuria   2.  Assess gross hematuria   3.  Follow up of transitional cell carcinoma   4.  Assess for BPH/LUTS and urinary retention  X   5.  Assess for possible urethral stricture   6.  Assess for OAB symptoms.   7.  Assess for abnormal urine cytology   8.  Follow up for abnormal CT scan findings (specify)    Bladder Stone    Thick walled bladder    Localized bladder wall irregularity (mass)    Other:     9.  Other:       Description of Procedure:   The patient was brought into the procedure suite and positioned on the table in the dorsal lithotomy position.       His genitalia were prepped with betadine.  Ten ccs of 2% lidocaine jelly were then instilled into the urethra and a penile clamp placed for ten minutes.  The clamp was then removed.      The flexible cystoscope was passed per urethra and advanced into the urinary bladder.     Findings:     Anterior Urethra:  Normal, without evidence of narrowing or stricture disease.    No evidence of urethral diverticula.   Prostatic Urethra:  Mild lateral lobe hyperplasia, without an enlarged middle lobe.   Bladder Neck:  No evidence of a bladder neck contracture.   Bladder:  Ureteral orifices in their normal anatomic positions on the trigone.    Appearance of ureteral orifices normal.  Clear efflux noted from each side.       Urothelium:    No evidence of TCC    No other urothelial lesions found.  1-2+ trabeculation noted.         No stones found.   Bladder Capacity:  Normal       Post-Procedure:     A 46F foley catheter was reinserted with clear output.         Assessment:    Post-Operative Urinary  Retention    Failed voiding trial today    Cystoscopy -> mild lateral lobe obstruction, no middle lobe    Very advanced age    I reviewed the pathophysiology of BPH with the patient.      We reviewed currently available medical and surgical treatment options for BPH.      We discussed the potential consequences of untreated BPH,      including elevated residual urine, UTIs, bladder      stone formation, upper tract compromise, urinary      retention, and renal function compromise.     Given his age, it would be desirable to avoid surgery (He is actually 82 years old)    Plan:    Check UA, culture, cytology   Cipro 500 x 1   Increase flomax to 0.8   Void trial in roughly one week    Follow Up: 7-10 days    LOS:   99204, 51700, 52000, 51702  C    45 minute face-to-face visit, beyond cystoscopy, with >75% of that spent in counseling and coordination of care.

## 2015-04-21 NOTE — Progress Notes (Signed)
Foley  Cath ( 16 fr with  10cc balloon)  Placed  Per  Dr.  Leeanne RioGentiles  Instructions.  Patient  Tolerated  Well.   Drained  900 cc  Clear  Pale  Yellow  Urine.  Patient  To  Room  1  To  Speak  Further  With  MD  Post  Cysto.

## 2015-04-21 NOTE — Patient Instructions (Signed)
.  fall

## 2015-04-22 DIAGNOSIS — R339 Retention of urine, unspecified: Secondary | ICD-10-CM | POA: Diagnosis not present

## 2015-04-22 DIAGNOSIS — I4892 Unspecified atrial flutter: Secondary | ICD-10-CM | POA: Diagnosis not present

## 2015-04-22 DIAGNOSIS — S72001D Fracture of unspecified part of neck of right femur, subsequent encounter for closed fracture with routine healing: Secondary | ICD-10-CM | POA: Diagnosis not present

## 2015-04-22 DIAGNOSIS — I110 Hypertensive heart disease with heart failure: Secondary | ICD-10-CM | POA: Diagnosis not present

## 2015-04-22 DIAGNOSIS — I4891 Unspecified atrial fibrillation: Secondary | ICD-10-CM | POA: Diagnosis not present

## 2015-04-22 DIAGNOSIS — I5022 Chronic systolic (congestive) heart failure: Secondary | ICD-10-CM | POA: Diagnosis not present

## 2015-04-23 ENCOUNTER — Encounter: Payer: Self-pay | Admitting: Urology

## 2015-04-23 DIAGNOSIS — R339 Retention of urine, unspecified: Secondary | ICD-10-CM | POA: Insufficient documentation

## 2015-04-23 DIAGNOSIS — N401 Enlarged prostate with lower urinary tract symptoms: Secondary | ICD-10-CM | POA: Insufficient documentation

## 2015-04-23 DIAGNOSIS — N138 Other obstructive and reflux uropathy: Secondary | ICD-10-CM | POA: Insufficient documentation

## 2015-04-24 DIAGNOSIS — I5022 Chronic systolic (congestive) heart failure: Secondary | ICD-10-CM | POA: Diagnosis not present

## 2015-04-24 DIAGNOSIS — I4892 Unspecified atrial flutter: Secondary | ICD-10-CM | POA: Diagnosis not present

## 2015-04-24 DIAGNOSIS — I4891 Unspecified atrial fibrillation: Secondary | ICD-10-CM | POA: Diagnosis not present

## 2015-04-24 DIAGNOSIS — I110 Hypertensive heart disease with heart failure: Secondary | ICD-10-CM | POA: Diagnosis not present

## 2015-04-24 DIAGNOSIS — S72001D Fracture of unspecified part of neck of right femur, subsequent encounter for closed fracture with routine healing: Secondary | ICD-10-CM | POA: Diagnosis not present

## 2015-04-24 DIAGNOSIS — R339 Retention of urine, unspecified: Secondary | ICD-10-CM | POA: Diagnosis not present

## 2015-04-25 DIAGNOSIS — S72001D Fracture of unspecified part of neck of right femur, subsequent encounter for closed fracture with routine healing: Secondary | ICD-10-CM | POA: Diagnosis not present

## 2015-04-25 DIAGNOSIS — I4891 Unspecified atrial fibrillation: Secondary | ICD-10-CM | POA: Diagnosis not present

## 2015-04-25 DIAGNOSIS — I110 Hypertensive heart disease with heart failure: Secondary | ICD-10-CM | POA: Diagnosis not present

## 2015-04-25 DIAGNOSIS — R339 Retention of urine, unspecified: Secondary | ICD-10-CM | POA: Diagnosis not present

## 2015-04-25 DIAGNOSIS — I5022 Chronic systolic (congestive) heart failure: Secondary | ICD-10-CM | POA: Diagnosis not present

## 2015-04-25 DIAGNOSIS — I4892 Unspecified atrial flutter: Secondary | ICD-10-CM | POA: Diagnosis not present

## 2015-04-27 DIAGNOSIS — I4891 Unspecified atrial fibrillation: Secondary | ICD-10-CM | POA: Diagnosis not present

## 2015-04-27 DIAGNOSIS — I4892 Unspecified atrial flutter: Secondary | ICD-10-CM | POA: Diagnosis not present

## 2015-04-27 DIAGNOSIS — R339 Retention of urine, unspecified: Secondary | ICD-10-CM | POA: Diagnosis not present

## 2015-04-27 DIAGNOSIS — S72001D Fracture of unspecified part of neck of right femur, subsequent encounter for closed fracture with routine healing: Secondary | ICD-10-CM | POA: Diagnosis not present

## 2015-04-27 DIAGNOSIS — I110 Hypertensive heart disease with heart failure: Secondary | ICD-10-CM | POA: Diagnosis not present

## 2015-04-27 DIAGNOSIS — I5022 Chronic systolic (congestive) heart failure: Secondary | ICD-10-CM | POA: Diagnosis not present

## 2015-04-28 DIAGNOSIS — I5022 Chronic systolic (congestive) heart failure: Secondary | ICD-10-CM | POA: Diagnosis not present

## 2015-04-28 DIAGNOSIS — S72001D Fracture of unspecified part of neck of right femur, subsequent encounter for closed fracture with routine healing: Secondary | ICD-10-CM | POA: Diagnosis not present

## 2015-04-28 DIAGNOSIS — I110 Hypertensive heart disease with heart failure: Secondary | ICD-10-CM | POA: Diagnosis not present

## 2015-04-28 DIAGNOSIS — R339 Retention of urine, unspecified: Secondary | ICD-10-CM | POA: Diagnosis not present

## 2015-04-28 DIAGNOSIS — I4891 Unspecified atrial fibrillation: Secondary | ICD-10-CM | POA: Diagnosis not present

## 2015-04-28 DIAGNOSIS — I4892 Unspecified atrial flutter: Secondary | ICD-10-CM | POA: Diagnosis not present

## 2015-05-01 ENCOUNTER — Encounter: Payer: Self-pay | Admitting: Urology

## 2015-05-01 ENCOUNTER — Ambulatory Visit: Payer: Self-pay | Admitting: Urology

## 2015-05-01 VITALS — Ht 64.0 in | Wt 131.0 lb

## 2015-05-01 DIAGNOSIS — R339 Retention of urine, unspecified: Secondary | ICD-10-CM

## 2015-05-01 DIAGNOSIS — N138 Other obstructive and reflux uropathy: Secondary | ICD-10-CM

## 2015-05-01 DIAGNOSIS — N472 Paraphimosis: Secondary | ICD-10-CM

## 2015-05-01 DIAGNOSIS — N401 Enlarged prostate with lower urinary tract symptoms: Secondary | ICD-10-CM

## 2015-05-01 DIAGNOSIS — I482 Chronic atrial fibrillation: Secondary | ICD-10-CM | POA: Diagnosis not present

## 2015-05-01 DIAGNOSIS — M84359A Stress fracture, hip, unspecified, initial encounter for fracture: Secondary | ICD-10-CM | POA: Diagnosis not present

## 2015-05-01 DIAGNOSIS — I25810 Atherosclerosis of coronary artery bypass graft(s) without angina pectoris: Secondary | ICD-10-CM | POA: Diagnosis not present

## 2015-05-01 DIAGNOSIS — M779 Enthesopathy, unspecified: Secondary | ICD-10-CM | POA: Diagnosis not present

## 2015-05-01 MED ORDER — FINASTERIDE 5 MG PO TABS *I*
5.0000 mg | ORAL_TABLET | Freq: Every day | ORAL | 3 refills | Status: DC
Start: 2015-05-01 — End: 2016-04-11

## 2015-05-01 NOTE — Progress Notes (Signed)
Visit Diagnosis(es) or CC: Urinary Retention, Penile Edema from paraphimosis    HPI:  The patient comes to the office today in follow up for the above diagnosis (-es).   BPH/Retention  The patient is in today for a void trial.  He is on tamsulosin 0.8 daily.  He has no dizziness.  He is anxious to have his catheter removed.     Paraphimosis  No recurrence.       PMH:    No changes reported    PSH:  No changes reported    FH:   No new cancer diagnoses      SH:  No changes reported    Review of Systems:    Systemic: Denies recent weight loss, weight gain, fatigue, fever, chills.  Neuro: Denies headache, dizziness.  Heart:: Denies chest pain or palpitations.  Respiratory: Denies shortness of breath.  G.I.: Denies change in bowel habits, blood in stool, reflux.  GU: Denies urogenital complaints, apart from what is documented in the HPI.  Musculoskeletal: Denies new muscle or joint pain.       Objective:    GENERAL:  No acute distress.  Well developed, well nourished.  NEUROLOGIC:  Oriented to person, place, time, and situation  PSYCHIATRIC:  Normal mood and affect  HEENT:  NC/AT.  Sclerae anicteric.  Nose/mouth/ears without obvious lesions.  NECK:  Trachea midline.  No JVD in sitting position.    SKIN:  Normal color, turgor, texture, hydration  LUNGS:  Respirations unlabored.    HEART:  Regular rate and rhythm    BACK/ORTHO:  No CVA tenderness.  No obvious back deformities.  No tenderness of the axial skeleton.    ABDOMEN:  Soft, nontender, nondistended, and without masses.  No evidence of bladder distention.  No inguinal hernias.  EXTREMITIES:  No evidence of cyanosis or clubbing.  Edema not evident. Calves non-tender  GENITALIA:   Penis - Normal uncircumcised penis.  Urethral meatus normal.   Scrotum - normal, without cutaneous lesions evident.    Hydrocele absent    Spermatocele absent   Testes - Right testis normal.  Axis, orientation normal.  Volume normal.    - Left testis normal.  Axis, orientation normal.   Volume normal.   Epididymides - Right normal.                                      - Left normal.   Vasa - Right normal.                       - Left normal.  PROSTATE:   Deferred      PROCEDURE:     The patient's bladder filled to 120 ccs   He was unable to void   A 58F foley catheter was reinserted         ASSESSMENT:   Urinary retention    Persistent, despite flomax x 2    PLAN:    Continue tamsulosin 0.8   Add finasteride 5 mgs daily    I reviewed the mechanism of action, drug interactions, and common side effects of this medication.   Void trial in one week   Cipro 500 x 1      FOLLOW-UP:   One week for a void trial    LOS:      99212, 51700, 51702  C    Time spent in  counseling and coordination of care (if used for LOS):

## 2015-05-01 NOTE — Progress Notes (Signed)
Catheter Change - Urethral    05/01/2015    Jonathan AnesCharles R Cisneros  [id number]    Catheter currently in : size - 16 french, material - latex  Any problems with catheter - leakage - No, blockage -No, encrustation - No  New catheter inserted : size - 16 french, material - latex   Irrigation OK? - yes  Return for next change - as per Dr. Jamal MaesGentile    Danielle Ma Lang, RN

## 2015-05-02 ENCOUNTER — Encounter: Payer: Self-pay | Admitting: Urology

## 2015-05-02 DIAGNOSIS — S72001D Fracture of unspecified part of neck of right femur, subsequent encounter for closed fracture with routine healing: Secondary | ICD-10-CM | POA: Diagnosis not present

## 2015-05-02 DIAGNOSIS — I4891 Unspecified atrial fibrillation: Secondary | ICD-10-CM | POA: Diagnosis not present

## 2015-05-02 DIAGNOSIS — I4892 Unspecified atrial flutter: Secondary | ICD-10-CM | POA: Diagnosis not present

## 2015-05-02 DIAGNOSIS — I5022 Chronic systolic (congestive) heart failure: Secondary | ICD-10-CM | POA: Diagnosis not present

## 2015-05-02 DIAGNOSIS — I110 Hypertensive heart disease with heart failure: Secondary | ICD-10-CM | POA: Diagnosis not present

## 2015-05-02 DIAGNOSIS — R339 Retention of urine, unspecified: Secondary | ICD-10-CM | POA: Diagnosis not present

## 2015-05-04 DIAGNOSIS — S72001D Fracture of unspecified part of neck of right femur, subsequent encounter for closed fracture with routine healing: Secondary | ICD-10-CM | POA: Diagnosis not present

## 2015-05-04 DIAGNOSIS — I5022 Chronic systolic (congestive) heart failure: Secondary | ICD-10-CM | POA: Diagnosis not present

## 2015-05-04 DIAGNOSIS — I4891 Unspecified atrial fibrillation: Secondary | ICD-10-CM | POA: Diagnosis not present

## 2015-05-04 DIAGNOSIS — I4892 Unspecified atrial flutter: Secondary | ICD-10-CM | POA: Diagnosis not present

## 2015-05-04 DIAGNOSIS — R339 Retention of urine, unspecified: Secondary | ICD-10-CM | POA: Diagnosis not present

## 2015-05-04 DIAGNOSIS — I110 Hypertensive heart disease with heart failure: Secondary | ICD-10-CM | POA: Diagnosis not present

## 2015-05-05 ENCOUNTER — Telehealth: Payer: Self-pay | Admitting: Cardiology

## 2015-05-05 NOTE — Telephone Encounter (Signed)
Patients daughter calling about patient weight loss. States when discharged from rehab about a month ago he was 142lbs. Last Monday he weighed 131 at PCP and yesterday he weighed 128. Medications were changed and wonders if this could be reason?  Would like to be seen sooner that 05/16/15 appointment. (no available NP appointments)

## 2015-05-05 NOTE — Telephone Encounter (Signed)
Spoke to daughter.  Patient feels well and is eating well.  No change in meds for now.  Will evaluate at his next OV.

## 2015-05-08 ENCOUNTER — Ambulatory Visit: Payer: Self-pay | Admitting: Urology

## 2015-05-08 ENCOUNTER — Encounter: Payer: Self-pay | Admitting: Urology

## 2015-05-08 VITALS — BP 103/59 | HR 81 | Ht 64.0 in | Wt 128.0 lb

## 2015-05-08 DIAGNOSIS — N401 Enlarged prostate with lower urinary tract symptoms: Secondary | ICD-10-CM

## 2015-05-08 DIAGNOSIS — R339 Retention of urine, unspecified: Secondary | ICD-10-CM

## 2015-05-08 DIAGNOSIS — N138 Other obstructive and reflux uropathy: Secondary | ICD-10-CM

## 2015-05-08 NOTE — Progress Notes (Signed)
Catheter Change - Urethral    05/08/2015    Jonathan AnesCharles R Cisneros  [id number]    Catheter currently in : size - 16 french straight, material - latex  Any problems with catheter - leakage - No, blockage -No, encrustation - No  New catheter inserted : size - 16 french straight, material - latex  Irrigation OK? - Yes, Irrigated with 120 ml of sterile H20, able to pull back 120 ml of sterile H20, catheter hooked to leg bag, pale yellow fluid noted to drain into the bag.  Return for next change - as per Dr. Jamal MaesGentile    Danielle Ma Lang, RN

## 2015-05-08 NOTE — Progress Notes (Signed)
Visit Diagnosis(es) or CC: Urinary Retention, Penile Edema from paraphimosis    HPI:  The patient comes to the office today, along with his daughter's partner, in follow up for the above diagnosis (-es).   BPH/Retention  The patient is in today for a void trial.  He is on tamsulosin 0.8 daily and finasteride.  He has no dizziness.  He is anxious to have his catheter removed once again.      Paraphimosis  No recurrence.       PMH:    No changes reported        Objective:    GENERAL:  No acute distress.  Well developed, well nourished.  NEUROLOGIC:  Oriented to person, place, time, and situation  PSYCHIATRIC:  Normal mood and affect  HEENT:  NC/AT.  Sclerae anicteric.  Nose/mouth/ears without obvious lesions.  NECK:  Trachea midline.  No JVD in sitting position.    SKIN:  Normal color, turgor, texture, hydration  LUNGS:  Respirations unlabored.    HEART:  Regular rate and rhythm    BACK/ORTHO:  No CVA tenderness.  No obvious back deformities.  No tenderness of the axial skeleton.    ABDOMEN:  Soft, nontender, nondistended, and without masses.  No evidence of bladder distention.  No inguinal hernias.  EXTREMITIES:  No evidence of cyanosis or clubbing.  Edema not evident. Calves non-tender  GENITALIA:   Penis - Normal uncircumcised penis.  Urethral meatus normal.  Foley in place.   Scrotum - normal, without cutaneous lesions evident.    Hydrocele absent    Spermatocele absent   Testes - Right testis normal.  Axis, orientation normal.  Volume normal.    - Left testis normal.  Axis, orientation normal.  Volume normal.   Epididymides - Right normal.                                      - Left normal.   Vasa - Right normal.                       - Left normal.  PROSTATE:   Deferred      PROCEDURE:     The patient's bladder filled to 400 ccs   He was able to void only 50 ccs   A 74F foley catheter was reinserted         ASSESSMENT:   Urinary retention    Persistent, despite flomax x 2    On finasteride as well    We reviewed  a TUV-P    We reviewed a Urolift    We reviewed Rezum    We reviewed an SP tube    They were given materials on Rezum    He will not accept surgery with an anesthetic    PLAN:    Continue tamsulosin 0.8   Continue finasteride 5 mgs daily    I reviewed the mechanism of action, drug interactions, and common side effects of this medication.   Void trial in one month, earlier PRN   Cipro 500 x 1      FOLLOW-UP:   One month for a void trial    LOS:      99212, 51700, 51702  C    Time spent in counseling and coordination of care (if used for LOS):  10 minute face-to-face visit with >75% of that spent in counseling and  coordination of care.

## 2015-05-09 ENCOUNTER — Encounter: Payer: Self-pay | Admitting: Urology

## 2015-05-09 DIAGNOSIS — I4891 Unspecified atrial fibrillation: Secondary | ICD-10-CM | POA: Diagnosis not present

## 2015-05-09 DIAGNOSIS — I5022 Chronic systolic (congestive) heart failure: Secondary | ICD-10-CM | POA: Diagnosis not present

## 2015-05-09 DIAGNOSIS — I110 Hypertensive heart disease with heart failure: Secondary | ICD-10-CM | POA: Diagnosis not present

## 2015-05-09 DIAGNOSIS — R339 Retention of urine, unspecified: Secondary | ICD-10-CM | POA: Diagnosis not present

## 2015-05-09 DIAGNOSIS — I4892 Unspecified atrial flutter: Secondary | ICD-10-CM | POA: Diagnosis not present

## 2015-05-09 DIAGNOSIS — S72001D Fracture of unspecified part of neck of right femur, subsequent encounter for closed fracture with routine healing: Secondary | ICD-10-CM | POA: Diagnosis not present

## 2015-05-10 DIAGNOSIS — I4892 Unspecified atrial flutter: Secondary | ICD-10-CM | POA: Diagnosis not present

## 2015-05-10 DIAGNOSIS — I110 Hypertensive heart disease with heart failure: Secondary | ICD-10-CM | POA: Diagnosis not present

## 2015-05-10 DIAGNOSIS — I5022 Chronic systolic (congestive) heart failure: Secondary | ICD-10-CM | POA: Diagnosis not present

## 2015-05-10 DIAGNOSIS — R339 Retention of urine, unspecified: Secondary | ICD-10-CM | POA: Diagnosis not present

## 2015-05-10 DIAGNOSIS — I4891 Unspecified atrial fibrillation: Secondary | ICD-10-CM | POA: Diagnosis not present

## 2015-05-10 DIAGNOSIS — S72001D Fracture of unspecified part of neck of right femur, subsequent encounter for closed fracture with routine healing: Secondary | ICD-10-CM | POA: Diagnosis not present

## 2015-05-11 DIAGNOSIS — I4892 Unspecified atrial flutter: Secondary | ICD-10-CM | POA: Diagnosis not present

## 2015-05-11 DIAGNOSIS — R339 Retention of urine, unspecified: Secondary | ICD-10-CM | POA: Diagnosis not present

## 2015-05-11 DIAGNOSIS — I110 Hypertensive heart disease with heart failure: Secondary | ICD-10-CM | POA: Diagnosis not present

## 2015-05-11 DIAGNOSIS — I5022 Chronic systolic (congestive) heart failure: Secondary | ICD-10-CM | POA: Diagnosis not present

## 2015-05-11 DIAGNOSIS — I4891 Unspecified atrial fibrillation: Secondary | ICD-10-CM | POA: Diagnosis not present

## 2015-05-11 DIAGNOSIS — S72001D Fracture of unspecified part of neck of right femur, subsequent encounter for closed fracture with routine healing: Secondary | ICD-10-CM | POA: Diagnosis not present

## 2015-05-11 NOTE — Telephone Encounter (Signed)
Called and notified that physical therapy should be scaled back, to stay hydrated as much as possible and to contact the cardiologist to see if he can safely scale back the Eliquis until the bleeding stops these would benefit the pt. Advised to call the office back with any concerns

## 2015-05-11 NOTE — Telephone Encounter (Signed)
Called and spoke with Cecelia and was advised that pt is having bleeding from his penis around his catheter. States that pt had so much last night that it dropped onto the floor. States that pt is having PT and pt is doing squats which they suspect that may be too much for pt. They also noticed that pt is now having blood in his foley bag. Pt is also on Eliquis. Advised to have pt stay well hydrated. Please advise.

## 2015-05-11 NOTE — Telephone Encounter (Addendum)
Jonathan Cisneros ([place] in [employer]) called to discuss a plan of care for Jonathan Cisneros who recently moved in with her and her partner. She stated that she has noticed dry blood in patient's underpants and now there is blood in his leg bag. She stated he has had 5 catheters in a month between home and the Nursing home (he's been discharged). Jonathan Cisneros would like to know what to do. She stated patient has had 3 voiding trials. She stated that PT comes to the home and has him do squats and she believes it is causing the catheter to be tugged.     Jonathan Cisneros stated patient is on Eliquis. She requested a script for leg bags and night bags. She doesn't know what medical supply store to use. She stated that [facility] calls to have his vitals checked. She would like to know if VNS should be restarted.     Please call her to advise at [phone].

## 2015-05-11 NOTE — Telephone Encounter (Signed)
The physical therapy needs to be scaled back.  He needs to hydrate as much as possible.  If he can pull back on the Eliquis safely, that would also be of benefit.  Jonathan Cisneros

## 2015-05-16 ENCOUNTER — Encounter: Payer: Self-pay | Admitting: Cardiology

## 2015-05-16 ENCOUNTER — Ambulatory Visit: Payer: Self-pay | Admitting: Cardiology

## 2015-05-16 VITALS — BP 99/58 | HR 77 | Ht 62.0 in | Wt 129.0 lb

## 2015-05-16 DIAGNOSIS — I2510 Atherosclerotic heart disease of native coronary artery without angina pectoris: Secondary | ICD-10-CM

## 2015-05-16 DIAGNOSIS — I447 Left bundle-branch block, unspecified: Secondary | ICD-10-CM

## 2015-05-16 DIAGNOSIS — I4819 Other persistent atrial fibrillation: Secondary | ICD-10-CM

## 2015-05-16 DIAGNOSIS — E785 Hyperlipidemia, unspecified: Secondary | ICD-10-CM

## 2015-05-16 DIAGNOSIS — I10 Essential (primary) hypertension: Secondary | ICD-10-CM

## 2015-05-16 DIAGNOSIS — R339 Retention of urine, unspecified: Secondary | ICD-10-CM

## 2015-05-16 DIAGNOSIS — I5022 Chronic systolic (congestive) heart failure: Secondary | ICD-10-CM

## 2015-05-16 DIAGNOSIS — I110 Hypertensive heart disease with heart failure: Secondary | ICD-10-CM | POA: Diagnosis not present

## 2015-05-16 DIAGNOSIS — I4892 Unspecified atrial flutter: Secondary | ICD-10-CM | POA: Diagnosis not present

## 2015-05-16 DIAGNOSIS — I481 Persistent atrial fibrillation: Secondary | ICD-10-CM | POA: Diagnosis not present

## 2015-05-16 DIAGNOSIS — I4891 Unspecified atrial fibrillation: Secondary | ICD-10-CM | POA: Diagnosis not present

## 2015-05-16 DIAGNOSIS — S72001D Fracture of unspecified part of neck of right femur, subsequent encounter for closed fracture with routine healing: Secondary | ICD-10-CM | POA: Diagnosis not present

## 2015-05-16 LAB — CBC
Hematocrit: 35 % — ABNORMAL LOW (ref 40–51)
Hemoglobin: 11.1 g/dL — ABNORMAL LOW (ref 13.7–17.5)
MCH: 31 pg/cell (ref 26–32)
MCHC: 32 g/dL (ref 32–37)
MCV: 98 fL — ABNORMAL HIGH (ref 79–92)
Platelets: 380 10*3/uL — ABNORMAL HIGH (ref 150–330)
RBC: 3.5 MIL/uL — ABNORMAL LOW (ref 4.6–6.1)
RDW: 16.7 % — ABNORMAL HIGH (ref 11.6–14.4)
WBC: 8.4 10*3/uL (ref 4.2–9.1)

## 2015-05-16 LAB — COMPREHENSIVE METABOLIC PANEL
ALT: 9 U/L (ref 0–50)
AST: 17 U/L (ref 0–50)
Albumin: 3.4 g/dL — ABNORMAL LOW (ref 3.5–5.2)
Alk Phos: 133 U/L — ABNORMAL HIGH (ref 40–130)
Anion Gap: 15 (ref 7–16)
Bilirubin,Total: 0.5 mg/dL (ref 0.0–1.2)
CO2: 26 mmol/L (ref 20–28)
Calcium: 8.9 mg/dL (ref 8.6–10.2)
Chloride: 101 mmol/L (ref 96–108)
Creatinine: 0.98 mg/dL (ref 0.67–1.17)
GFR,Black: 73 *
GFR,Caucasian: 63 *
Glucose: 86 mg/dL (ref 60–99)
Lab: 19 mg/dL (ref 6–20)
Potassium: 4 mmol/L (ref 3.3–5.1)
Sodium: 142 mmol/L (ref 133–145)
Total Protein: 6.7 g/dL (ref 6.3–7.7)

## 2015-05-16 LAB — LIPID PANEL
Chol/HDL Ratio: 2
Cholesterol: 106 mg/dL
HDL: 52 mg/dL
LDL Calculated: 33 mg/dL
Non HDL Cholesterol: 54 mg/dL
Triglycerides: 105 mg/dL

## 2015-05-16 MED ORDER — ATORVASTATIN CALCIUM 20 MG PO TABS *I*
20.0000 mg | ORAL_TABLET | Freq: Every day | ORAL | 3 refills | Status: DC
Start: 2015-05-16 — End: 2016-05-05

## 2015-05-16 MED ORDER — METOPROLOL TARTRATE 50 MG PO TABS *I*
50.0000 mg | ORAL_TABLET | Freq: Two times a day (BID) | ORAL | 3 refills | Status: DC
Start: 2015-05-16 — End: 2016-05-05

## 2015-05-16 MED ORDER — TORSEMIDE 20 MG PO TABS *I*
20.0000 mg | ORAL_TABLET | Freq: Every morning | ORAL | 3 refills | Status: DC
Start: 2015-05-16 — End: 2016-05-05

## 2015-05-16 NOTE — Progress Notes (Signed)
History of Present Illness:   Jonathan Cisneros was seen today at the [facility] cardiology office for re-evaluation of CAD s/p CABG, chronic systolic heart failure, LBBB, HTN, hyperlipidemia, and atrial fib/flutter.  He is now 82 y.o. and was last seen 4 month(s) ago. Since his last visit he tripped over his cane and fractured his right femur, which required surgery with a rod to repair.  He spent time in a rehab facility and has now recently returned to his daughters home.  He required a foley catheter due to BPH and urinary retention, and has not been able to pass any void tests since then.  He has in the last few days had some urethral bleeding, likely due to irritation from physical therapy exercises and increasing ambulation.  He did have weight gain and increased edema while in the hospital, and was switched to torsemide form furosemide.  His metoprolol dose was also increased.  Presently, his cardiac symptoms are negative for chest pain, dyspnea, palpitations, near-syncope, syncope, orthopnea, exertional chest pressure/discomfort, or lower extremity edema. He lost considerable weight after going on the torsemide but his weight has been stable over the last week.      His coronary risk factors include Hypertension, Hyperlipidemia and History of CAD. He is regaining some of his previous level of activity, and is presently ambulating with a walker.  He has not had any further falls.      He  is maintained on anticoagulation therapy with apixiban because of atrial fibrillation.   His  therapy is followed here at [facility].    Patient's Medications   New Prescriptions    METOPROLOL (LOPRESSOR) 50 MG TABLET    Take 1 tablet (50 mg total) by mouth 2 times daily   Previous Medications    ACETAMINOPHEN (TYLENOL) 500 MG TABLET    Take 2 tablets (1,000 mg total) by mouth 3 times daily as needed for Pain    CALCIUM CITRATE-VITAMIN D (CITRACAL+D) 315-250 MG-UNIT PER TABLET    Take 1 tablet by mouth 2 times  daily    CHOLECALCIFEROL (VITAMIN D) 2000 UNITS TABLET    Take 1 tablet (2,000 Units total) by mouth daily    DOCUSATE SODIUM (COLACE) 100 MG CAPSULE    Take 2 capsules (200 mg total) by mouth 2 times daily    ELIQUIS 2.5 MG TABLET    Take 1 tablet (2.5 mg total) by mouth 2 times daily    FINASTERIDE (PROSCAR) 5 MG TABLET    Take 1 tablet (5 mg total) by mouth daily   For use by men only.    POLYETHYLENE GLYCOL (GLYCOLAX/MIRALAX) PACK POWDER PACKET    Take 17 g by mouth 2 times daily    SENNA (SENOKOT) 8.6 MG TABLET    Take 2 tablets by mouth daily    TAMSULOSIN (FLOMAX) 0.4 MG CAPSULE    Take two capsules each evening   Modified Medications    Modified Medication Previous Medication    ATORVASTATIN (LIPITOR) 20 MG TABLET atorvastatin (LIPITOR) 20 MG tablet       Take 1 tablet (20 mg total) by mouth daily    Take 1 tablet (20 mg total) by mouth daily    TORSEMIDE (DEMADEX) 20 MG TABLET torsemide (DEMADEX) 20 MG tablet       Take 1 tablet (20 mg total) by mouth every morning    Take 20 mg by mouth every morning   Discontinued Medications    METOPROLOL TARTRATE 37.5  MG TABS    Take 37.5 mg by mouth every 12 hours       Allergies   Allergen Reactions    Penicillins Rash         Past Medical History:   Diagnosis Date    Arthritis     osteoarthritis    Atrial fibrillation     Atrial flutter     CHF (congestive heart failure)     chronic systolic heart failure, LVEF 30% on echo 03/10/2013    CHF (congestive heart failure)     Coronary artery disease     LM and 3 vessel, s/p CABG 02/13/2003    Hyperlipidemia     Hypertension     LBBB (left bundle branch block)     Urinary retention          Past Surgical History:   Procedure Laterality Date    APPENDECTOMY      in his 5930's    CATARACT REMOVAL WITH IMPLANT Bilateral     CORONARY ARTERY BYPASS GRAFT  02/13/2003    LIMA to LAD, SVG to OM 1-2, SVG to RCA    ORTHOPEDIC SURGERY      traumatic injuries; rod in rt tibia; ?plate in l femor (separate accidents)    right  femur fracture  02/2015    rod in femur         History reviewed. No pertinent family history.      Social History     Social History    Marital status: Widowed     Spouse name: N/A    Number of children: N/A    Years of education: N/A     Social History Main Topics    Smoking status: Never Smoker    Smokeless tobacco: None    Alcohol use Yes    Drug use: None    Sexual activity: Not Asked     Other Topics Concern    None     Social History Narrative    ** Merged History Encounter **              Review of Systems:    General: No fatigue, fevers  Skin: No new lesions  Eyes: some vision blurring  ENT: No nasal symptoms, hearing loss, sore throat  Cardiovascular: as noted in the HPI  Pulmonary: as noted in the HPI, no cough or change in sputum  GI: No nausea, vomiting, or change in bowel habits, appetite is good  GU: No dysuria or polyuria; no hematuria, bleeding is around the foley catheter.  Heme: No bruising   Endo: No symptoms of hyperglycemia or hypoglycemia, no symptoms of thyroid excess  Musculoskeletal: No arthralgias/myalgias  Neuro: No headaches, seizures, stroke symptoms      PHYSICAL EXAM:  General: alert, elderly appearing, and NAD    Visit Vitals    BP 99/58 (BP Location: Left arm, Patient Position: Sitting, Cuff Size: adult)    Pulse 77    Ht 1.575 m (5\' 2" )    Wt 58.5 kg (129 lb)    BMI 23.59 kg/m2     Pulse is irregularly irregular    HEENT: anicteric, no palpable thyromegaly   Pulm: clear, no decreased BS or dullness on percussion  CV: neck veins are not distended, carotids are without bruits and normal upstroke, 1st heart sound variable intensity, second heart sound paradoxically split, no S3 heard, S4 absent, no murmur or rub  Ext: trace edema on the left  Neuro: oriented  x 3      Lab Results   Component Value Date    WBC 7.6 02/25/2015    HGB 9.6 (L) 03/02/2015    HCT 30 (L) 03/02/2015    PLT 273 02/25/2015    INR 1.4 (H) 02/25/2015       Lab Results   Component Value Date    NA 137  03/02/2015    K 4.8 03/02/2015    K 5.1 03/01/2015    CL 103 03/02/2015    CO2 21 03/02/2015    UN 26 (H) 03/02/2015    UN 30 (H) 03/01/2015    CREAT 0.88 03/02/2015    CREAT 0.93 03/01/2015    GFRC 71 03/02/2015    GFRC 67 03/01/2015    GFRB 82 03/02/2015    GFRB 78 03/01/2015    GLU 92 03/02/2015    CA 8.9 03/02/2015       Lab Results   Component Value Date    ALB 3.8 02/25/2015       No results found for: TROP, CK, MCKMB, RI, BNP    No results found for: CHOL, TRIG, HDL, LDL, LDLC, NHDLC, [facility]    Lab Results   Component Value Date    TSH 2.26 02/25/2015          Assessment & Plan:   Jonathan Cisneros has asymptomatic coronary disease.  He has chronic systolic heart failure with LBBB and a LVEF of 30%.  He had a recent increase in edema after his orthopedic surgery but is now back to his baseline, and doing well with recovery from his leg fracture.  I discussed possible ICD with the patient and his daughter.  He does not want to consider one.      He is on apixaban for his atrial fibrillation.  He is having some urethral bleeding related to his foley.  His last Hct was in January.  I advised him not to stop the apixaban unless the bleeding becomes more substantial or if his HCT shows a significant drop.       No lipid results are available.    Blood pressure appears to be lower than his normal today, but he is not having any hypotension symptoms.  It is likely related to the recent addition of the Flomax and increase in the metoprolol.  Will not make any changes for now.  His daughter says that his BP at home is usually higher when she checks it.    Updated lab work ordered.    No changes made in the present therapy.     Will  follow-up in 4 months.  Thank you for allowing me to participate in this patient's care.    Cristi Loron, MD

## 2015-05-18 DIAGNOSIS — I110 Hypertensive heart disease with heart failure: Secondary | ICD-10-CM | POA: Diagnosis not present

## 2015-05-18 DIAGNOSIS — S72001D Fracture of unspecified part of neck of right femur, subsequent encounter for closed fracture with routine healing: Secondary | ICD-10-CM | POA: Diagnosis not present

## 2015-05-18 DIAGNOSIS — R339 Retention of urine, unspecified: Secondary | ICD-10-CM | POA: Diagnosis not present

## 2015-05-18 DIAGNOSIS — I4891 Unspecified atrial fibrillation: Secondary | ICD-10-CM | POA: Diagnosis not present

## 2015-05-18 DIAGNOSIS — I4892 Unspecified atrial flutter: Secondary | ICD-10-CM | POA: Diagnosis not present

## 2015-05-18 DIAGNOSIS — I5022 Chronic systolic (congestive) heart failure: Secondary | ICD-10-CM | POA: Diagnosis not present

## 2015-05-24 ENCOUNTER — Telehealth: Payer: Self-pay | Admitting: Urology

## 2015-05-24 NOTE — Telephone Encounter (Signed)
Roberta-Daughter, is calling to inform the office that her father is experiencing hematuria. She states that an aide took him on for a walk. As they were walking the wheel chair hit a bump and he states that he feels discomfort. The blood in the urine has been going on for about 2 days. Please call her back to discuss at [phone].

## 2015-05-24 NOTE — Telephone Encounter (Signed)
Today urine was amber colored.  Told daughter that he has to drink more.

## 2015-05-25 DIAGNOSIS — R339 Retention of urine, unspecified: Secondary | ICD-10-CM | POA: Diagnosis not present

## 2015-05-25 DIAGNOSIS — S72001D Fracture of unspecified part of neck of right femur, subsequent encounter for closed fracture with routine healing: Secondary | ICD-10-CM | POA: Diagnosis not present

## 2015-05-25 DIAGNOSIS — I4892 Unspecified atrial flutter: Secondary | ICD-10-CM | POA: Diagnosis not present

## 2015-05-25 DIAGNOSIS — I110 Hypertensive heart disease with heart failure: Secondary | ICD-10-CM | POA: Diagnosis not present

## 2015-05-25 DIAGNOSIS — I5022 Chronic systolic (congestive) heart failure: Secondary | ICD-10-CM | POA: Diagnosis not present

## 2015-05-25 DIAGNOSIS — I4891 Unspecified atrial fibrillation: Secondary | ICD-10-CM | POA: Diagnosis not present

## 2015-05-30 DIAGNOSIS — S72001D Fracture of unspecified part of neck of right femur, subsequent encounter for closed fracture with routine healing: Secondary | ICD-10-CM | POA: Diagnosis not present

## 2015-05-30 DIAGNOSIS — I110 Hypertensive heart disease with heart failure: Secondary | ICD-10-CM | POA: Diagnosis not present

## 2015-05-30 DIAGNOSIS — I4892 Unspecified atrial flutter: Secondary | ICD-10-CM | POA: Diagnosis not present

## 2015-05-30 DIAGNOSIS — R339 Retention of urine, unspecified: Secondary | ICD-10-CM | POA: Diagnosis not present

## 2015-05-30 DIAGNOSIS — I5022 Chronic systolic (congestive) heart failure: Secondary | ICD-10-CM | POA: Diagnosis not present

## 2015-05-30 DIAGNOSIS — I4891 Unspecified atrial fibrillation: Secondary | ICD-10-CM | POA: Diagnosis not present

## 2015-06-07 ENCOUNTER — Encounter: Payer: Self-pay | Admitting: Urology

## 2015-06-07 ENCOUNTER — Ambulatory Visit: Payer: Self-pay | Admitting: Urology

## 2015-06-07 VITALS — Ht 62.0 in | Wt 129.0 lb

## 2015-06-07 DIAGNOSIS — N138 Other obstructive and reflux uropathy: Secondary | ICD-10-CM

## 2015-06-07 DIAGNOSIS — R339 Retention of urine, unspecified: Secondary | ICD-10-CM

## 2015-06-07 DIAGNOSIS — N401 Enlarged prostate with lower urinary tract symptoms: Secondary | ICD-10-CM

## 2015-06-07 NOTE — Progress Notes (Signed)
Follow-up Visit    Prior Visit Diagnosis(es) or CC: Urinary Retention, Penile Edema from paraphimosis    Plan as of last visit 05/08/14 w/ Dr. Ron AgeeGentile:    Continue tamsulosin 0.8   Continue finasteride 5 mgs daily    I reviewed the mechanism of action, drug interactions, and common side effects of this medication.   Void trial in one month, earlier PRN   Cipro 500 x 1  FOLLOW-UP:   One month for a void trial    HPI:  Presents for voiding trial today.  Anticipate catheter removal.     BPH/Retention  Jonathan Cisneros is on tamsulosin 0.8 daily and finasteride.  Jonathan Cisneros has no dizziness.      Paraphimosis  No recurrence.     PHYSICAL EXAM:  Generally healthy appearing. No apparent distress.   Ambulates with a walker.  Pleasant, with an appropriate mood and affect.  Alert and oriented x3. No gross cranial nerve deficits.  Normal skin color without diaphoresis.  Respirations unlabored.  No lower extremity edema.  Appropriate strength in both upper and lower extremities.    GENITALIA:   Penis - Normal uncircumcised penis.  Urethral meatus normal.  Foley in place.   Scrotum - normal, without cutaneous lesions evident.    Hydrocele absent    Spermatocele absent   Testes - Right testis normal.  Axis, orientation normal.  Volume normal.    - Left testis normal.  Axis, orientation normal.  Volume normal.   Epididymides - Right normal.                                      - Left normal.   Vasa - Right normal.                       - Left normal.    PROCEDURE:  400 mL of sterile water was instilled into the bladder.  Jonathan Cisneros voided approximately 105 mL in the room sitting up at the edge of the bed.   I offered for him to go to the toilet.  Jonathan Cisneros was able to void much more in that situation.  Jonathan Cisneros was comfortable leaving without a catheter.       ASSESSMENT:  1. Enlarged prostate with urinary obstruction    2. Urinary retention - Hx        Successful voiding trial today.    PLAN:  Continue tamsulosin 0.8.  Continue finasteride 5 mgs daily.  Proper fluid  intake reviewed.  Reviewed double voiding and timed voiding.    Follow up in one month for PVR.     The appointment was concluded after confirming with the patient that they fully understand the above treatment plan and denied having any further questions or concerns.  The patient was instructed to call if any issues or concerns develop prior to their next appointment.     Management consultantDictation using Dragon voice recognition software may have been used.  To expedite communication, some inadvertent typographical and transcription errors generated by the transcription software may have been missed despite a reasonable effort to make corrections.    Addendum 06/14/15    His daughter called to report that Jonathan Cisneros had a little dribbling in his underwear the other day.  Otherwise Jonathan Cisneros's had no CVAT.  Difficulties with voiding.  Will continue follow up as previously planned.

## 2015-06-07 NOTE — Patient Instructions (Signed)
DIETARY/ BEHAVIORAL RECOMMENDATIONS TO HELP MAINTAIN URINARY TRACT HEALTH    PROPER FLUID INTAKE  · Drink approximately 6-8 cups of fluid per day (1 cup = 8 oz).  ·  Try to get approximately 16 ounces within the first 30 minutes you are awake.  · Any non-caffeinated/non-alcoholic fluid is OK  (water, juice, milk, lemonade, etc.)  · However, some acidic beverages and foods can also be irritants.  · Helps dilute urine to ease irritation, improving pain, urgency and spasms.  · Helps treat and prevent urinary tract infections.  · Improve flow of your urine stream.    Limit Caffeine and Alcohol  · Caffeine is commonly found in coffee, tea, soda and chocolate.  · Alcoholic beverages include beer, wine, and liquor.  · Caffeine and alcohol are both diuretics (making the kidney product more urine) and stimulants to the bladder.  For men they are irritants to the prostate as well.  · Even decaffeinated coffee, tea, sodas, and artifical sweeteners can act as irritants.    Bland Diet  · Foods that are spicy or acidic may also cause bladder irritation.  · There are many other foods that could irritate the bladder. If you keep a diary of when you have a irritative urinary symptoms and also write down what you have had to ear or drink during the previous couple of meals, you may find a correlation.    Avoid late evening fluid intake  · The more you drink before bed the more likely you will need to urinate at night.  · Generally, 2 hours before bed is enough time,  Sips between then are OK.    Double Urinating (for those who retain urine in the bladder)  · After urinating, leave the bathroom and go back to try and urinate again 10-15 minutes later.  · DO NOT strain.  If you cannot go the second time, try again later.    Timed urinating  · Try to urinate about every 2-3 hrs during the day.  This can help avoid urgency and incontinence by keeping lower bladder volumes.  · This will also help in the case of urinary  retention.      SUGGESTIONS TO HELP WITH CONSTIPATION    Having difficulty with bowel movements (straining or going more than a day or two between movements) can affect your urination in a negative manor.     · Stay well hydrated. You stool needs to be moist for it to come out easily.  · Increase your activity level (exercising), as tolerated.  · Aim for 25-30 grams of fiber intake per day.    Suggestion to get more fiber and improve GI bacteria:  · Fiber One Cereal (1/2 cup) or 1-2 servings of a high fiber cereal per day.  · Activia yogurt (6oz.) daily. Greek yogurt or [employer] "Super Yogurt" would work as well.  · A handful of prunes each evening.  · Fruits / vegetables with each meal.    If still difficult you can add one or more of the following as directed on the label. Add only 1 at a time. Decrease if you get diarrhea.  · Colace AND/OR Sennokot (over the counter stool softeners) can be taken as directed on the label in addition to the above dietary recommendations.  · Metamucil or Miralax are other alternatives to the above.    If you haven't had a movement in several days or difficulty persists:  · Use an over-the-counter Fleet Enema, as   directed in the product instructions, in addition to the above recommendations if you are still needing to strain with your bowel movements.    · See your primary care physician if you have tried all of the above and are still having trouble with constipation.

## 2015-06-13 ENCOUNTER — Telehealth: Payer: Self-pay | Admitting: Urology

## 2015-06-13 NOTE — Telephone Encounter (Signed)
Nursing,    Phone service called stating patient had called stating he had his catheter removed last week and he has been experiencing incontinence since. Patient would like to speak to a nurse, he can be reached at [phone].    Jonathan Cisneros

## 2015-06-14 NOTE — Telephone Encounter (Signed)
Spoke with Jenel LucksRoberta, daughter. Urology HIPAA reviewed.   Per daughter catheter was removed last week.  Stated her father dribbled urine yesterday and his underpants were wet.  This is the only time since his catheter was removed last week.  No complaints of urgency, frequency, burning or blood in his urine.   Taking his medications per his last plan of care.   Drinking plenty of water and cranberry juice.  Last seen 06/07/2015. Please advise.   [facility] patient)

## 2015-06-14 NOTE — Telephone Encounter (Signed)
I reviewed the records.  Overall, it seems like he is doing fairly well.  I'm not too concerned about the dribbling.  Make sure he wears a light pad to keep the area dry.  Continue good fluid intake and timed voiding every 2-3 hours throughout the day.  Follow up as previously scheduled.  Thanks

## 2015-06-14 NOTE — Telephone Encounter (Signed)
Left message ok per hippa relaying the message below and asked to call office if any other questions.

## 2015-07-11 ENCOUNTER — Telehealth: Payer: Self-pay | Admitting: Urology

## 2015-07-11 ENCOUNTER — Ambulatory Visit: Payer: Self-pay | Admitting: Urology

## 2015-07-11 NOTE — Telephone Encounter (Signed)
Patient will not make his appointment today and has been rescheduled.

## 2015-07-19 DIAGNOSIS — N401 Enlarged prostate with lower urinary tract symptoms: Secondary | ICD-10-CM | POA: Diagnosis not present

## 2015-07-19 DIAGNOSIS — M84359A Stress fracture, hip, unspecified, initial encounter for fracture: Secondary | ICD-10-CM | POA: Diagnosis not present

## 2015-07-19 DIAGNOSIS — I482 Chronic atrial fibrillation: Secondary | ICD-10-CM | POA: Diagnosis not present

## 2015-07-19 DIAGNOSIS — I25810 Atherosclerosis of coronary artery bypass graft(s) without angina pectoris: Secondary | ICD-10-CM | POA: Diagnosis not present

## 2015-07-19 DIAGNOSIS — M779 Enthesopathy, unspecified: Secondary | ICD-10-CM | POA: Diagnosis not present

## 2015-08-09 ENCOUNTER — Telehealth: Payer: Self-pay | Admitting: Cardiology

## 2015-08-09 NOTE — Telephone Encounter (Signed)
Spoke w/ caregiver Vazquezecilia. No INR is needed as pt is not on Warfarin. Presently on Eliquis. Last CBC 05-2015.  Explained that often oral surgery can be done without interrupting the Eliquis. If the oral surgeon would like an Eliquis hold than we can forward to Dr. Margarette AsalWormer for consideration.  She will contact the oral surgeon and call us back prn.

## 2015-08-09 NOTE — Telephone Encounter (Signed)
[facility] pt: Jonathan Cisneros in August- Needs a standing order for PT/INR. Looks like no one is following. Hasn't had a pt/inr in some time. Pt is on Eliquis, needs oral surgery.   Please call caretaker, [name] ASAP.

## 2015-08-16 ENCOUNTER — Ambulatory Visit: Payer: Self-pay | Admitting: Urology

## 2015-08-16 ENCOUNTER — Telehealth: Payer: Self-pay | Admitting: Urology

## 2015-08-16 NOTE — Telephone Encounter (Signed)
Daughter of patient called to reschedule his appt today with Genevieve Norlanderan Barney at 10:15, I rescheduled appt for July 26th.

## 2015-09-06 ENCOUNTER — Ambulatory Visit: Payer: Self-pay | Admitting: Urology

## 2015-09-15 ENCOUNTER — Other Ambulatory Visit: Payer: Self-pay | Admitting: Cardiology

## 2015-09-15 DIAGNOSIS — I4891 Unspecified atrial fibrillation: Secondary | ICD-10-CM

## 2015-09-26 ENCOUNTER — Ambulatory Visit: Payer: Self-pay | Admitting: Urology

## 2015-09-26 ENCOUNTER — Encounter: Payer: Self-pay | Admitting: Urology

## 2015-09-26 VITALS — BP 92/52 | HR 92 | Ht 62.0 in | Wt 129.0 lb

## 2015-09-26 DIAGNOSIS — N138 Other obstructive and reflux uropathy: Secondary | ICD-10-CM

## 2015-09-26 DIAGNOSIS — R339 Retention of urine, unspecified: Secondary | ICD-10-CM

## 2015-09-26 DIAGNOSIS — N401 Enlarged prostate with lower urinary tract symptoms: Secondary | ICD-10-CM

## 2015-09-26 NOTE — Patient Instructions (Signed)
DIETARY/ BEHAVIORAL RECOMMENDATIONS TO HELP MAINTAIN URINARY TRACT HEALTH    PROPER FLUID INTAKE  · Drink approximately 6-8 cups of fluid per day (1 cup = 8 oz).  ·  Try to get approximately 16 ounces within the first 30 minutes you are awake.  · Any non-caffeinated/non-alcoholic fluid is OK  (water, juice, milk, lemonade, etc.)  · However, some acidic beverages and foods can also be irritants.  · Helps dilute urine to ease irritation, improving pain, urgency and spasms.  · Helps treat and prevent urinary tract infections.  · Improve flow of your urine stream.    Limit Caffeine and Alcohol  · Caffeine is commonly found in coffee, tea, soda and chocolate.  · Alcoholic beverages include beer, wine, and liquor.  · Caffeine and alcohol are both diuretics (making the kidney product more urine) and stimulants to the bladder.  For men they are irritants to the prostate as well.  · Even decaffeinated coffee, tea, sodas, and artifical sweeteners can act as irritants.    Bland Diet  · Foods that are spicy or acidic may also cause bladder irritation.  · There are many other foods that could irritate the bladder. If you keep a diary of when you have a irritative urinary symptoms and also write down what you have had to ear or drink during the previous couple of meals, you may find a correlation.    Avoid late evening fluid intake  · The more you drink before bed the more likely you will need to urinate at night.  · Generally, 2 hours before bed is enough time,  Sips between then are OK.    Double Urinating (for those who retain urine in the bladder)  · After urinating, leave the bathroom and go back to try and urinate again 10-15 minutes later.  · DO NOT strain.  If you cannot go the second time, try again later.    Timed urinating  · Try to urinate about every 2-3 hrs during the day.  This can help avoid urgency and incontinence by keeping lower bladder volumes.  · This will also help in the case of urinary  retention.      SUGGESTIONS TO HELP WITH CONSTIPATION    Having difficulty with bowel movements (straining or going more than a day or two between movements) can affect your urination in a negative manor.     · Stay well hydrated. You stool needs to be moist for it to come out easily.  · Increase your activity level (exercising), as tolerated.  · Aim for 25-30 grams of fiber intake per day.    Suggestion to get more fiber and improve GI bacteria:  · Fiber One Cereal (1/2 cup) or 1-2 servings of a high fiber cereal per day.  · Activia yogurt (6oz.) daily. Greek yogurt or [employer] "Super Yogurt" would work as well.  · A handful of prunes each evening.  · Fruits / vegetables with each meal.    If still difficult you can add one or more of the following as directed on the label. Add only 1 at a time. Decrease if you get diarrhea.  · Colace AND/OR Sennokot (over the counter stool softeners) can be taken as directed on the label in addition to the above dietary recommendations.  · Metamucil or Miralax are other alternatives to the above.    If you haven't had a movement in several days or difficulty persists:  · Use an over-the-counter Fleet Enema, as   directed in the product instructions, in addition to the above recommendations if you are still needing to strain with your bowel movements.    · See your primary care physician if you have tried all of the above and are still having trouble with constipation.

## 2015-09-26 NOTE — Progress Notes (Signed)
Delayed Follow-up Visit    Prior Visit Diagnosis(es) or CC: Urinary Retention, Penile Edema from paraphimosis    Plan as of last visit 06/07/15:  Continue tamsulosin 0.8.  Continue finasteride 5 mgs daily.  Proper fluid intake reviewed.  Reviewed double voiding and timed voiding.  Follow up in one month for PVR.     Addendum 06/14/15  His daughter called to report that he had a little dribbling in his underwear the other day.  Otherwise he's had no CVAT.  Difficulties with voiding.  Will continue follow up as previously planned.    HPI:  Doing well after catheter removal April 2017.  "Everything is perfect."  Constipation is also improved.     BPH/Retention  He is on tamsulosin 0.8 daily and finasteride.  He has no dizziness.      Paraphimosis  No recurrence.     PHYSICAL EXAM:  Generally healthy appearing. No apparent distress.   Ambulates with a walker.  Pleasant, with an appropriate mood and affect.  Alert and oriented x3. No gross cranial nerve deficits.  Normal skin color without diaphoresis.  Respirations unlabored.  No lower extremity edema.  Appropriate strength in both upper and lower extremities.  No lower abdominal discomfort, tenderness or distention.   GU:  Phallus is Uncircumcised and without lesions.  No testicular or epididymal masses or tenderness.  Spermatic cord contents are benign and non-tender.  No palpable inguinal hernias nor lymphadenopathy.  Prostate approximately 3+ in size, smooth, symmetric, non-tender and without nodularity.   No rectal masses or tenderness.    Accompanied by his daughter and son-in-law.    Unable to provide a voided sample as he urinated prior to coming to the appointment.    ASSESSMENT:  1. Enlarged prostate with urinary obstruction    2. Urinary retention - Hx        PLAN:  Continue tamsulosin 0.8.  Continue finasteride 5 mgs daily.  Proper fluid intake reviewed.  Reviewed double voiding and timed voiding.    Return in about 1 year (around 09/25/2016) for Follow-up, PVR,  Appt w/ Dr. Ron AgeeGentile.       The appointment was concluded after confirming with the patient that they fully understand the above treatment plan and denied having any further questions or concerns.  The patient was instructed to call if any issues or concerns develop prior to their next appointment.     Management consultantDictation using Dragon voice recognition software may have been used.  To expedite communication, some inadvertent typographical and transcription errors generated by the transcription software may have been missed despite a reasonable effort to make corrections.

## 2015-09-28 ENCOUNTER — Ambulatory Visit: Payer: Self-pay | Admitting: Cardiology

## 2015-10-06 ENCOUNTER — Encounter: Payer: Self-pay | Admitting: Cardiology

## 2015-10-06 ENCOUNTER — Ambulatory Visit: Payer: Self-pay | Admitting: Cardiology

## 2015-10-06 VITALS — BP 103/52 | HR 77 | Ht 62.0 in | Wt 127.0 lb

## 2015-10-06 DIAGNOSIS — I10 Essential (primary) hypertension: Secondary | ICD-10-CM

## 2015-10-06 NOTE — Patient Instructions (Signed)
Please make no changes in your medications.    Come back to see me in six months, but call any time with concerns.  I can always see you sooner if you request.

## 2015-10-09 NOTE — Progress Notes (Signed)
Subjective:     I had the pleasure of evaluating your 82 y.o. patient Jonathan Cisneros in the Outpatient Cardiology Clinic at the [facility] on October 06, 2015.    Past Medical History:   Diagnosis Date    Arthritis     osteoarthritis    Atrial fibrillation     Atrial flutter     CHF (congestive heart failure)     chronic systolic heart failure, LVEF 30% on echo 03/10/2013    CHF (congestive heart failure)     Coronary artery disease     LM and 3 vessel, s/p CABG 02/13/2003    Hyperlipidemia     Hypertension     LBBB (left bundle branch block)     Urinary retention            HPI: Jonathan Cisneros is a 82 year old male with a history as above who is doing well.  He is transferring his care from Dr. Margarette Asal to me and has no complaints today . He denies chest pain or shortness of breath and takes his medication, generally as administered by his family, with no difficulty.  He remains reasonably active for a person his age and notes no complaints.    Current Outpatient Prescriptions   Medication Sig Note    KLOR-CON M20 20 MEQ  tablet Take 20 mEq by mouth daily     atorvastatin (LIPITOR) 20 MG tablet Take 1 tablet (20 mg total) by mouth daily     metoprolol (LOPRESSOR) 50 MG tablet Take 1 tablet (50 mg total) by mouth 2 times daily     torsemide (DEMADEX) 20 MG tablet Take 1 tablet (20 mg total) by mouth every morning     finasteride (PROSCAR) 5 MG tablet Take 1 tablet (5 mg total) by mouth daily   For use by men only.     tamsulosin (FLOMAX) 0.4 MG capsule Take two capsules each evening     ELIQUIS 2.5 MG tablet Take 1 tablet (2.5 mg total) by mouth 2 times daily     acetaminophen (TYLENOL) 500 mg tablet Take 2 tablets (1,000 mg total) by mouth 3 times daily as needed for Pain 05/16/2015: Takes one 650 mg tylenol arthritis in the am, and then 1000 mg tylenol at bedtime    docusate sodium (COLACE) 100 MG capsule Take 2 capsules (200 mg total) by mouth 2 times daily     calcium citrate-vitamin  D (CITRACAL+D) 315-250 MG-UNIT per tablet Take 1 tablet by mouth 2 times daily     cholecalciferol (VITAMIN D) 2000 UNITS tablet Take 1 tablet (2,000 Units total) by mouth daily      No current facility-administered medications for this visit.        Viviano  has a past medical history of Arthritis; Atrial fibrillation; Atrial flutter; CHF (congestive heart failure); CHF (congestive heart failure); Coronary artery disease; Hyperlipidemia; Hypertension; LBBB (left bundle branch block); and Urinary retention.    Review of Systems  Constitutional: Appetite is good; free of fevers, night sweats, and unintentional weight loss  CV: Free of chest discomfort, shortness of breath, and peripheral edema  Respiratory: Free of cough, wheezing, and dyspnea  GI: Free of nausea and vomiting, abdominal pain, and a change in bowel habits  Neuro: Free of MS changes, motor weakness, and sensory changes     Objective:     Blood pressure 103/52, pulse 77, height 1.575 m (5\' 2" ), weight 57.6 kg (127 lb).  Physical Exam  General: Appears well and in no acute distress.  Respiratory: The sounds are equal bilaterally and free of crackles, rhonchi, and wheeze.   COR: Normal S1 and S2 sounds free of murmur; the rhythm is regular; the JVP is below the clavicle while seated.  Extremities: The legs are free of edema   Neurological: Alert and oriented; independent and steady gait.    Cardiographics  ECG: Not recorded today.    Lab Review      No results found for this or any previous visit (from the past 336 hour(s)).         Assessment:      It is my impression that Jonathan Cisneros is doing very well.  I am delighted over his stability and independence and activity for his age.     Plan:     I have given Jonathan Cisneros the following recommendations:    Please make no changes in your medications.    Come back to see me in six months, but call any time with concerns.  I can always see you sooner if you request.    Thank you very much for allowing  me to assist in the care of Jonathan Cisneros; please do not hesitate to contact me if you have any questions.    Trudi IdaJOHN D Satoya Feeley, MD,PHD  Professor of Medicine / Cardiology

## 2015-11-05 DIAGNOSIS — Z23 Encounter for immunization: Secondary | ICD-10-CM | POA: Diagnosis not present

## 2015-11-16 DIAGNOSIS — N401 Enlarged prostate with lower urinary tract symptoms: Secondary | ICD-10-CM | POA: Diagnosis not present

## 2015-11-16 DIAGNOSIS — I25810 Atherosclerosis of coronary artery bypass graft(s) without angina pectoris: Secondary | ICD-10-CM | POA: Diagnosis not present

## 2015-11-16 DIAGNOSIS — M779 Enthesopathy, unspecified: Secondary | ICD-10-CM | POA: Diagnosis not present

## 2015-11-16 DIAGNOSIS — M84359A Stress fracture, hip, unspecified, initial encounter for fracture: Secondary | ICD-10-CM | POA: Diagnosis not present

## 2015-11-16 DIAGNOSIS — L989 Disorder of the skin and subcutaneous tissue, unspecified: Secondary | ICD-10-CM | POA: Diagnosis not present

## 2015-11-16 DIAGNOSIS — I482 Chronic atrial fibrillation: Secondary | ICD-10-CM | POA: Diagnosis not present

## 2015-12-04 DIAGNOSIS — C44629 Squamous cell carcinoma of skin of left upper limb, including shoulder: Secondary | ICD-10-CM | POA: Diagnosis not present

## 2015-12-04 DIAGNOSIS — D485 Neoplasm of uncertain behavior of skin: Secondary | ICD-10-CM | POA: Diagnosis not present

## 2015-12-18 DIAGNOSIS — L905 Scar conditions and fibrosis of skin: Secondary | ICD-10-CM | POA: Diagnosis not present

## 2015-12-18 DIAGNOSIS — C44629 Squamous cell carcinoma of skin of left upper limb, including shoulder: Secondary | ICD-10-CM | POA: Diagnosis not present

## 2016-03-13 ENCOUNTER — Other Ambulatory Visit: Payer: Self-pay | Admitting: Cardiology

## 2016-03-13 DIAGNOSIS — I4891 Unspecified atrial fibrillation: Secondary | ICD-10-CM

## 2016-04-11 ENCOUNTER — Other Ambulatory Visit: Payer: Self-pay | Admitting: Urology

## 2016-04-11 DIAGNOSIS — N138 Other obstructive and reflux uropathy: Secondary | ICD-10-CM

## 2016-04-11 DIAGNOSIS — N401 Enlarged prostate with lower urinary tract symptoms: Secondary | ICD-10-CM

## 2016-04-12 NOTE — Telephone Encounter (Signed)
Patient last seen 09/26/2015 with the plan below. Follow up scheduled 09/30/2016.     PLAN:  Continue tamsulosin 0.8.  Continue finasteride 5 mgs daily.  Proper fluid intake reviewed.  Reviewed double voiding and timed voiding.    Return in about 1 year (around 09/25/2016) for Follow-up, PVR, Appt w/ Dr. Ron AgeeGentile

## 2016-04-23 ENCOUNTER — Ambulatory Visit: Payer: Self-pay | Admitting: Cardiology

## 2016-04-25 ENCOUNTER — Other Ambulatory Visit: Payer: Self-pay | Admitting: Urology

## 2016-04-25 DIAGNOSIS — N401 Enlarged prostate with lower urinary tract symptoms: Secondary | ICD-10-CM

## 2016-04-25 DIAGNOSIS — R339 Retention of urine, unspecified: Secondary | ICD-10-CM

## 2016-04-25 DIAGNOSIS — N138 Other obstructive and reflux uropathy: Secondary | ICD-10-CM

## 2016-04-26 NOTE — Telephone Encounter (Signed)
Patient seen 09/26/2015 with the plan below. Follow up scheduled 09/30/2016.     PLAN:  Continue tamsulosin 0.8.  Continue finasteride 5 mgs daily.  Proper fluid intake reviewed.  Reviewed double voiding and timed voiding.    Return in about 1 year (around 09/25/2016) for Follow-up, PVR, Appt w/ Dr. Ron AgeeGentile.

## 2016-05-05 ENCOUNTER — Other Ambulatory Visit: Payer: Self-pay | Admitting: Cardiology

## 2016-05-05 DIAGNOSIS — I2510 Atherosclerotic heart disease of native coronary artery without angina pectoris: Secondary | ICD-10-CM

## 2016-06-07 ENCOUNTER — Other Ambulatory Visit: Payer: Self-pay | Admitting: Cardiology

## 2016-06-07 DIAGNOSIS — I4891 Unspecified atrial fibrillation: Secondary | ICD-10-CM

## 2016-06-18 ENCOUNTER — Encounter: Payer: Self-pay | Admitting: Cardiology

## 2016-06-18 ENCOUNTER — Other Ambulatory Visit: Payer: Self-pay | Admitting: Cardiology

## 2016-06-18 ENCOUNTER — Ambulatory Visit: Payer: Medicare Other | Attending: Internal Medicine | Admitting: Cardiology

## 2016-06-18 VITALS — BP 107/56 | HR 78 | Ht 62.0 in | Wt 133.0 lb

## 2016-06-18 DIAGNOSIS — I10 Essential (primary) hypertension: Secondary | ICD-10-CM

## 2016-06-18 NOTE — Patient Instructions (Signed)
Please make no changes in your medications.    Come back to see me in a year but call any time with concerns.  We can always see you sooner if you request.

## 2016-06-18 NOTE — Progress Notes (Signed)
Subjective:     I had the pleasure of evaluating your [age] y.o. patient Jonathan Cisneros in [facility] on 06/18/2016  .    Past Medical History:   Diagnosis Date    Arthritis     osteoarthritis    Atrial fibrillation     Atrial flutter     CHF (congestive heart failure)     chronic systolic heart failure, LVEF 30% on echo 03/10/2013    CHF (congestive heart failure)     Coronary artery disease     LM and 3 vessel, s/p CABG 02/13/2003    Hyperlipidemia     Hypertension     LBBB (left bundle branch block)     Urinary retention            HPI:  Jonathan Cisneros is a 83 year old male (who states he is actually [id number]) with a history as above who is doing very well . He denies chest pain or shortness of breath and he takes his medications without any side effect or difficulty . He remains mobile and active and is very conversational today and well oriented.  He has no cardiovascular complaints today.    Current Outpatient Prescriptions   Medication Sig Note    apixaban (ELIQUIS) 2.5 MG tablet Take 1 tablet (2.5 mg total) by mouth 2 times daily   Please have labs drawn prior to next refill. [phone]     metoprolol (LOPRESSOR) 50 MG tablet TAKE 1 TABLET (50 MG TOTAL) BY MOUTH 2 TIMES DAILY     atorvastatin (LIPITOR) 20 MG tablet TAKE 1 TABLET (20 MG TOTAL) BY MOUTH DAILY     torsemide (DEMADEX) 20 MG tablet TAKE 1 TABLET (20 MG TOTAL) BY MOUTH EVERY MORNING     tamsulosin (FLOMAX) 0.4 MG capsule TAKE TWO CAPSULES EACH EVENING     finasteride (PROSCAR) 5 MG tablet TAKE 1 TABLET BY MOUTH EVERY DAY     KLOR-CON M20 20 MEQ  tablet Take 20 mEq by mouth daily     docusate sodium (COLACE) 100 MG capsule Take 2 capsules (200 mg total) by mouth 2 times daily     acetaminophen (TYLENOL) 500 mg tablet Take 2 tablets (1,000 mg total) by mouth 3 times daily as needed for Pain 05/16/2015: Takes one 650 mg tylenol arthritis in the am, and then 1000 mg tylenol at bedtime      No current facility-administered medications for this visit.        Jonathan Cisneros  has a past medical history of Arthritis; Atrial fibrillation; Atrial flutter; CHF (congestive heart failure); CHF (congestive heart failure); Coronary artery disease; Hyperlipidemia; Hypertension; LBBB (left bundle branch block); and Urinary retention.    Review of Systems  Constitutional: Appetite is good; free of fevers, night sweats, and unintentional weight loss  CV: Free of chest discomfort, shortness of breath, and peripheral edema  Respiratory: Free of cough, wheezing, and dyspnea  GI: Free of nausea and vomiting, abdominal pain, and a change in bowel habits     Neuro: Free of MS changes, motor weakness, and sensory changes     Objective:     Blood pressure 107/56, pulse 78, height 1.575 m (5\' 2" ), weight 60.3 kg (133 lb).    Physical Exam  General: Appears well and in no acute distress.  Respiratory: The sounds are equal bilaterally and free of crackles, rhonchi, and wheeze.   COR: Normal S1 and S2 sounds free  of murmur; the rhythm is regular; the JVP is below the clavicle while seated.  Extremities: The legs are free of edema  Neurological: Alert and oriented; independent and steady gait.    Cardiographics  ECG: Not recorded today.    Lab Review    No results found for this or any previous visit (from the past 336 hour(s)).         Assessment:      It is my impression that Jonathan Cisneros is doing very well.   I am delighted over his stability.     Plan:     I have given Jonathan Cisneros the following recommendations:    Please make no changes in your medications.    Come back to see me in a year but call any time with concerns.  We can always see you sooner if you request.    Thank you very much for allowing me to assist in the care of Jonathan Cisneros; please do not hesitate to contact me if you have any questions.    Trudi Ida, MD,PHD  Professor of Medicine / Cardiology

## 2016-06-19 DIAGNOSIS — I25810 Atherosclerosis of coronary artery bypass graft(s) without angina pectoris: Secondary | ICD-10-CM | POA: Diagnosis not present

## 2016-06-19 DIAGNOSIS — I482 Chronic atrial fibrillation: Secondary | ICD-10-CM | POA: Diagnosis not present

## 2016-06-19 DIAGNOSIS — N401 Enlarged prostate with lower urinary tract symptoms: Secondary | ICD-10-CM | POA: Diagnosis not present

## 2016-06-19 DIAGNOSIS — M779 Enthesopathy, unspecified: Secondary | ICD-10-CM | POA: Diagnosis not present

## 2016-06-23 LAB — EKG 12-LEAD
QRS: 9 deg
QRSD: 140 ms
QT: 472 ms
QTc: 521 ms
Rate: 73 {beats}/min
Severity: ABNORMAL
T: -32 deg

## 2016-09-04 DIAGNOSIS — H9113 Presbycusis, bilateral: Secondary | ICD-10-CM | POA: Diagnosis not present

## 2016-09-11 DIAGNOSIS — H903 Sensorineural hearing loss, bilateral: Secondary | ICD-10-CM | POA: Diagnosis not present

## 2016-09-17 ENCOUNTER — Telehealth: Payer: Self-pay

## 2016-09-17 NOTE — Telephone Encounter (Signed)
Patients appointment has been cancelled due to changes in the physicians schedule.  Please call patient with our apologies and new appt date/time.  If patient has concerning symptoms or feels this appointment is further out than they wish please assist or add to wait list.  NEW APPOINTMENT DATE/TIME:    09/27/16 (Fri) 9:30 AM 15 min Gentile, David, MD [facility]     Time change    Access center:  If patient is unable to make this new appointment due to conflicts in schedule please arrange for the next available appointment date/time.  A message can be sent to pool if patient is uncomfortable with appointments available.

## 2016-09-17 NOTE — Telephone Encounter (Signed)
Lm for patient to call our office back, please advise patient that his appointment on 08/20 has been rescheduled to 08/17 at 9:30am with Dr[facility]. Thanks

## 2016-09-19 ENCOUNTER — Ambulatory Visit (INDEPENDENT_AMBULATORY_CARE_PROVIDER_SITE_OTHER): Payer: Medicare Other | Admitting: Podiatry

## 2016-09-19 ENCOUNTER — Encounter: Payer: Self-pay | Admitting: Podiatry

## 2016-09-19 DIAGNOSIS — M205X1 Other deformities of toe(s) (acquired), right foot: Secondary | ICD-10-CM

## 2016-09-19 DIAGNOSIS — M779 Enthesopathy, unspecified: Secondary | ICD-10-CM

## 2016-09-19 MED ORDER — TRIAMCINOLONE ACETONIDE 10 MG/ML IJ SUSP
10.0000 mg | Freq: Once | INTRAMUSCULAR | Status: AC
  Administered 2016-09-19: 10 mg

## 2016-09-19 NOTE — Progress Notes (Signed)
Subjective:    Patient ID: Justin [name], male   DOB: 83 y.o.   MRN: [id number]   HPI patient presents with caregiver with pain in the right big toe joint lateral side with inflammation and dorsal irritation    Review of Systems  All other systems reviewed and are negative.       Objective:  Physical Exam  Constitutional: He is oriented to person, place, and time.  Cardiovascular: Intact distal pulses.   Musculoskeletal: Normal range of motion.  Neurological: He is oriented to person, place, and time.  Skin: Skin is dry.  Nursing note and vitals reviewed.  Vasco status mildly diminished with neurological diminished sharp Dole vibratory. Patient's found to have restriction of motion first MPJ right with inflammation around the joint surface and is painful on the lateral side with mild edema. Patient does not have good muscle strength adequate for a portion of his age and is in good mental spirits     Assessment:    Inflammatory capsulitis first MPJ right with hallux limitus deformity     Plan:   H&P condition reviewed and explained to caregiver. At this time I did careful injection around the first MPJ 3 mg Kenalog 5 mg Xylocaine to reduce inflammation and advised that if this does not get better we will get more aggressive but hopefully this all the problem

## 2016-09-19 NOTE — Progress Notes (Signed)
   Subjective:    Patient ID: Justin [name], male    DOB: 1913/12/27, 83 y.o.   MRN: [id number]  HPI  Chief Complaint  Patient presents with  . Toe Pain    Onset 3 weeks/ Big toe, rt foot       Review of Systems  HENT: Positive for hearing loss.   Hematological: Bruises/bleeds easily.  All other systems reviewed and are negative.      Objective:   Physical Exam        Assessment & Plan:

## 2016-09-23 ENCOUNTER — Telehealth: Payer: Self-pay | Admitting: Urology

## 2016-09-23 NOTE — Telephone Encounter (Signed)
Lm advising patient that his appt on 08/17 has been rescheduled to 08/31 at 9:15am with Dr. Gentile at the [place] office.

## 2016-09-27 ENCOUNTER — Ambulatory Visit: Payer: Medicare Other | Admitting: Urology

## 2016-09-30 ENCOUNTER — Ambulatory Visit: Payer: Self-pay | Admitting: Urology

## 2016-10-04 ENCOUNTER — Other Ambulatory Visit: Payer: Self-pay | Admitting: Urology

## 2016-10-04 DIAGNOSIS — N138 Other obstructive and reflux uropathy: Secondary | ICD-10-CM

## 2016-10-04 DIAGNOSIS — N401 Enlarged prostate with lower urinary tract symptoms: Secondary | ICD-10-CM

## 2016-10-05 ENCOUNTER — Other Ambulatory Visit: Payer: Self-pay | Admitting: Cardiology

## 2016-10-05 DIAGNOSIS — I4891 Unspecified atrial fibrillation: Secondary | ICD-10-CM

## 2016-10-11 ENCOUNTER — Ambulatory Visit: Payer: Medicare Other | Admitting: Urology

## 2016-10-18 ENCOUNTER — Other Ambulatory Visit: Payer: Self-pay | Admitting: Urology

## 2016-10-18 DIAGNOSIS — N401 Enlarged prostate with lower urinary tract symptoms: Secondary | ICD-10-CM

## 2016-10-18 DIAGNOSIS — N138 Other obstructive and reflux uropathy: Secondary | ICD-10-CM

## 2016-10-23 ENCOUNTER — Other Ambulatory Visit: Payer: Self-pay | Admitting: Urology

## 2016-10-23 DIAGNOSIS — R339 Retention of urine, unspecified: Secondary | ICD-10-CM

## 2016-10-23 DIAGNOSIS — N401 Enlarged prostate with lower urinary tract symptoms: Secondary | ICD-10-CM

## 2016-10-23 DIAGNOSIS — N138 Other obstructive and reflux uropathy: Secondary | ICD-10-CM

## 2016-10-28 ENCOUNTER — Ambulatory Visit: Payer: Medicare Other | Admitting: Podiatry

## 2016-11-05 ENCOUNTER — Encounter: Payer: Self-pay | Admitting: Gastroenterology

## 2016-11-11 DIAGNOSIS — Z23 Encounter for immunization: Secondary | ICD-10-CM | POA: Diagnosis not present

## 2016-12-04 DIAGNOSIS — I252 Old myocardial infarction: Secondary | ICD-10-CM | POA: Diagnosis not present

## 2016-12-04 DIAGNOSIS — I447 Left bundle-branch block, unspecified: Secondary | ICD-10-CM | POA: Diagnosis not present

## 2016-12-04 DIAGNOSIS — I119 Hypertensive heart disease without heart failure: Secondary | ICD-10-CM | POA: Diagnosis not present

## 2016-12-04 DIAGNOSIS — I2510 Atherosclerotic heart disease of native coronary artery without angina pectoris: Secondary | ICD-10-CM | POA: Diagnosis not present

## 2016-12-04 DIAGNOSIS — I255 Ischemic cardiomyopathy: Secondary | ICD-10-CM | POA: Diagnosis not present

## 2016-12-04 DIAGNOSIS — I483 Typical atrial flutter: Secondary | ICD-10-CM | POA: Diagnosis not present

## 2016-12-04 DIAGNOSIS — E7849 Other hyperlipidemia: Secondary | ICD-10-CM | POA: Diagnosis not present

## 2016-12-04 DIAGNOSIS — I5022 Chronic systolic (congestive) heart failure: Secondary | ICD-10-CM | POA: Diagnosis not present

## 2016-12-04 DIAGNOSIS — I482 Chronic atrial fibrillation: Secondary | ICD-10-CM | POA: Diagnosis not present

## 2016-12-04 DIAGNOSIS — Z7901 Long term (current) use of anticoagulants: Secondary | ICD-10-CM | POA: Diagnosis not present

## 2017-01-05 ENCOUNTER — Other Ambulatory Visit: Payer: Self-pay | Admitting: Cardiology

## 2017-01-05 DIAGNOSIS — I4891 Unspecified atrial fibrillation: Secondary | ICD-10-CM

## 2017-03-11 ENCOUNTER — Ambulatory Visit: Payer: Medicare Other | Admitting: Urology

## 2017-03-26 DIAGNOSIS — I119 Hypertensive heart disease without heart failure: Secondary | ICD-10-CM | POA: Diagnosis not present

## 2017-03-26 DIAGNOSIS — N400 Enlarged prostate without lower urinary tract symptoms: Secondary | ICD-10-CM | POA: Diagnosis not present

## 2017-03-26 DIAGNOSIS — E7800 Pure hypercholesterolemia, unspecified: Secondary | ICD-10-CM | POA: Diagnosis not present

## 2017-03-26 DIAGNOSIS — I2510 Atherosclerotic heart disease of native coronary artery without angina pectoris: Secondary | ICD-10-CM | POA: Diagnosis not present

## 2017-03-26 DIAGNOSIS — Z1389 Encounter for screening for other disorder: Secondary | ICD-10-CM | POA: Diagnosis not present

## 2017-03-26 DIAGNOSIS — M129 Arthropathy, unspecified: Secondary | ICD-10-CM | POA: Diagnosis not present

## 2017-03-26 DIAGNOSIS — Z0000 Encounter for general adult medical examination without abnormal findings: Secondary | ICD-10-CM | POA: Diagnosis not present

## 2017-04-14 DIAGNOSIS — S81801A Unspecified open wound, right lower leg, initial encounter: Secondary | ICD-10-CM | POA: Diagnosis not present

## 2017-04-25 ENCOUNTER — Ambulatory Visit: Payer: Medicare Other | Admitting: Podiatry

## 2017-05-09 ENCOUNTER — Encounter: Payer: Self-pay | Admitting: Podiatry

## 2017-05-09 ENCOUNTER — Ambulatory Visit (INDEPENDENT_AMBULATORY_CARE_PROVIDER_SITE_OTHER): Payer: Medicare Other | Admitting: Podiatry

## 2017-05-09 DIAGNOSIS — B351 Tinea unguium: Secondary | ICD-10-CM | POA: Diagnosis not present

## 2017-05-09 DIAGNOSIS — D689 Coagulation defect, unspecified: Secondary | ICD-10-CM | POA: Diagnosis not present

## 2017-05-09 DIAGNOSIS — M79676 Pain in unspecified toe(s): Secondary | ICD-10-CM | POA: Diagnosis not present

## 2017-05-09 NOTE — Progress Notes (Signed)
  Subjective:  Patient ID: Justin [name], male    DOB: 1913-02-18,  MRN: [id number]  Chief Complaint  Patient presents with  . Nail Problem    debride   84 y.o. male returns for the above complaint.  Reports painful elongated nails he cannot care for himself.  On anticoagulant therapy.  Objective:  There were no vitals filed for this visit. General AA&O x3. Normal mood and affect.  Vascular Pedal pulses palpable.  Neurologic Epicritic sensation grossly intact.  Dermatologic No open lesions. Skin normal texture and turgor. Toenails x 10 elongated, thickened, dystrophic.  Orthopedic: Pain to palpation about the toenails.   Assessment & Plan:  Patient was evaluated and treated and all questions answered.  Onychomycosis with coagulation defect -Nails palliatively debrided as below. -Educated on self-care  Procedure: Nail Debridement Rationale: Coagulation defect Type of Debridement: manual, sharp debridement. Instrumentation: Nail nipper, rotary burr. Number of Nails: 10  Return in about 3 months (around 08/09/2017) for Routine Foot Care - On Xarelto .

## 2017-06-09 DIAGNOSIS — I5022 Chronic systolic (congestive) heart failure: Secondary | ICD-10-CM | POA: Diagnosis not present

## 2017-06-09 DIAGNOSIS — I447 Left bundle-branch block, unspecified: Secondary | ICD-10-CM | POA: Diagnosis not present

## 2017-06-09 DIAGNOSIS — I2510 Atherosclerotic heart disease of native coronary artery without angina pectoris: Secondary | ICD-10-CM | POA: Diagnosis not present

## 2017-06-09 DIAGNOSIS — E785 Hyperlipidemia, unspecified: Secondary | ICD-10-CM | POA: Diagnosis not present

## 2017-06-09 DIAGNOSIS — Z7901 Long term (current) use of anticoagulants: Secondary | ICD-10-CM | POA: Diagnosis not present

## 2017-06-09 DIAGNOSIS — I482 Chronic atrial fibrillation: Secondary | ICD-10-CM | POA: Diagnosis not present

## 2017-06-09 DIAGNOSIS — I252 Old myocardial infarction: Secondary | ICD-10-CM | POA: Diagnosis not present

## 2017-06-09 DIAGNOSIS — I255 Ischemic cardiomyopathy: Secondary | ICD-10-CM | POA: Diagnosis not present

## 2017-06-09 DIAGNOSIS — I119 Hypertensive heart disease without heart failure: Secondary | ICD-10-CM | POA: Diagnosis not present

## 2017-08-22 ENCOUNTER — Ambulatory Visit: Payer: Medicare Other | Admitting: Podiatry

## 2017-08-29 ENCOUNTER — Ambulatory Visit: Payer: Medicare Other | Admitting: Podiatry

## 2017-09-03 ENCOUNTER — Encounter: Payer: Self-pay | Admitting: Podiatry

## 2017-09-03 ENCOUNTER — Ambulatory Visit (INDEPENDENT_AMBULATORY_CARE_PROVIDER_SITE_OTHER): Payer: Medicare Other | Admitting: Podiatry

## 2017-09-03 DIAGNOSIS — B351 Tinea unguium: Secondary | ICD-10-CM

## 2017-09-03 DIAGNOSIS — D689 Coagulation defect, unspecified: Secondary | ICD-10-CM | POA: Diagnosis not present

## 2017-09-04 ENCOUNTER — Ambulatory Visit: Payer: Medicare Other | Admitting: Podiatry

## 2017-09-04 NOTE — Progress Notes (Signed)
  Subjective:  Patient ID: Justin [name], male    DOB: Dec 21, 1913,  MRN: [id number]  Chief Complaint  Patient presents with  . Nail Problem    debridement   84 y.o. male returns for the above complaint. Denies new issues.  Objective:  There were no vitals filed for this visit. General AA&O x3. Normal mood and affect.  Vascular Pedal pulses palpable.  Neurologic Epicritic sensation grossly intact.  Dermatologic No open lesions. Skin normal texture and turgor. Toenails x 10 elongated, thickened, dystrophic.  Orthopedic: Pain to palpation about the toenails.   Assessment & Plan:  Patient was evaluated and treated and all questions answered.  Onychomycosis with coagulation defect -Nails palliatively debrided as below. -Educated on self-care  Procedure: Nail Debridement Rationale: Coagulation defect Type of Debridement: manual, sharp debridement. Instrumentation: Nail nipper, rotary burr. Number of Nails: 10  Return in about 3 months (around 12/04/2017) for Diabetic Foot Care.

## 2017-11-18 DIAGNOSIS — Z23 Encounter for immunization: Secondary | ICD-10-CM | POA: Diagnosis not present

## 2017-11-18 DIAGNOSIS — R600 Localized edema: Secondary | ICD-10-CM | POA: Diagnosis not present

## 2017-11-18 DIAGNOSIS — N400 Enlarged prostate without lower urinary tract symptoms: Secondary | ICD-10-CM | POA: Diagnosis not present

## 2017-11-18 DIAGNOSIS — I119 Hypertensive heart disease without heart failure: Secondary | ICD-10-CM | POA: Diagnosis not present

## 2017-11-18 DIAGNOSIS — E7800 Pure hypercholesterolemia, unspecified: Secondary | ICD-10-CM | POA: Diagnosis not present

## 2017-11-18 DIAGNOSIS — I4892 Unspecified atrial flutter: Secondary | ICD-10-CM | POA: Diagnosis not present

## 2017-11-18 DIAGNOSIS — M129 Arthropathy, unspecified: Secondary | ICD-10-CM | POA: Diagnosis not present

## 2017-12-05 ENCOUNTER — Ambulatory Visit: Payer: Medicare Other | Admitting: Podiatry

## 2018-01-07 ENCOUNTER — Telehealth: Payer: Self-pay | Admitting: Urology

## 2018-01-07 ENCOUNTER — Other Ambulatory Visit: Payer: Self-pay | Admitting: Urology

## 2018-01-07 DIAGNOSIS — N401 Enlarged prostate with lower urinary tract symptoms: Secondary | ICD-10-CM

## 2018-01-07 DIAGNOSIS — N138 Other obstructive and reflux uropathy: Secondary | ICD-10-CM

## 2018-01-07 NOTE — Telephone Encounter (Signed)
Staff,   Please call patient and schedule follow up appointment. Thanks Jabril Pursell

## 2018-01-07 NOTE — Telephone Encounter (Signed)
Melissa, please see below. Patient needs follow up with Dr. Ron AgeeGentile or Jesusita Okaan in the next few months.     Kayren EavesBelous, Marta, RN 3 hours ago (7:53 AM)         Staff,   Please call patient and schedule follow up appointment. Thanks [employer]

## 2018-01-12 NOTE — Telephone Encounter (Signed)
Scheduled 03/18/18 with Gilford Raidan, MyChart message sent.

## 2018-01-27 ENCOUNTER — Encounter: Payer: Self-pay | Admitting: Urology

## 2018-03-13 ENCOUNTER — Telehealth: Payer: Medicare Other | Admitting: Urology

## 2018-03-13 NOTE — Telephone Encounter (Signed)
Patient has BPH w/Obstruction and has not been seen in 2.5 yrs.    No Other providers have checked his PSA in the past 2.5 Yrs.    Did you want to order a PSA for him prior to his 03/18/18 visit? Or No?    Thanks,  Trula Ore

## 2018-03-13 NOTE — Telephone Encounter (Signed)
Actually, would decide when he's seen.  He's 89 and PSA check may not be warranted unless the prostate exam is abnormal.  Thanks

## 2018-03-17 ENCOUNTER — Other Ambulatory Visit: Payer: Self-pay

## 2018-03-17 ENCOUNTER — Inpatient Hospital Stay (HOSPITAL_COMMUNITY)
Admission: EM | Admit: 2018-03-17 | Discharge: 2018-03-19 | DRG: 243 | Disposition: A | Discharge status: Home / Self Care | Payer: Medicare Other | Attending: Cardiovascular Disease | Admitting: Cardiovascular Disease

## 2018-03-17 ENCOUNTER — Emergency Department (HOSPITAL_COMMUNITY): Payer: Medicare Other

## 2018-03-17 ENCOUNTER — Encounter (HOSPITAL_COMMUNITY): Payer: Self-pay | Admitting: Emergency Medicine

## 2018-03-17 DIAGNOSIS — I4821 Permanent atrial fibrillation: Secondary | ICD-10-CM | POA: Diagnosis present

## 2018-03-17 DIAGNOSIS — R2681 Unsteadiness on feet: Secondary | ICD-10-CM | POA: Diagnosis present

## 2018-03-17 DIAGNOSIS — N289 Disorder of kidney and ureter, unspecified: Secondary | ICD-10-CM | POA: Diagnosis present

## 2018-03-17 DIAGNOSIS — I959 Hypotension, unspecified: Secondary | ICD-10-CM | POA: Diagnosis not present

## 2018-03-17 DIAGNOSIS — Z79899 Other long term (current) drug therapy: Secondary | ICD-10-CM

## 2018-03-17 DIAGNOSIS — Z79891 Long term (current) use of opiate analgesic: Secondary | ICD-10-CM

## 2018-03-17 DIAGNOSIS — N401 Enlarged prostate with lower urinary tract symptoms: Secondary | ICD-10-CM | POA: Diagnosis present

## 2018-03-17 DIAGNOSIS — R079 Chest pain, unspecified: Secondary | ICD-10-CM | POA: Diagnosis not present

## 2018-03-17 DIAGNOSIS — H269 Unspecified cataract: Secondary | ICD-10-CM | POA: Diagnosis present

## 2018-03-17 DIAGNOSIS — I482 Chronic atrial fibrillation, unspecified: Secondary | ICD-10-CM

## 2018-03-17 DIAGNOSIS — I442 Atrioventricular block, complete: Secondary | ICD-10-CM | POA: Diagnosis not present

## 2018-03-17 DIAGNOSIS — H3530 Unspecified macular degeneration: Secondary | ICD-10-CM | POA: Diagnosis present

## 2018-03-17 DIAGNOSIS — R351 Nocturia: Secondary | ICD-10-CM | POA: Diagnosis present

## 2018-03-17 DIAGNOSIS — I110 Hypertensive heart disease with heart failure: Secondary | ICD-10-CM | POA: Diagnosis present

## 2018-03-17 DIAGNOSIS — Z825 Family history of asthma and other chronic lower respiratory diseases: Secondary | ICD-10-CM

## 2018-03-17 DIAGNOSIS — Z950 Presence of cardiac pacemaker: Secondary | ICD-10-CM | POA: Diagnosis not present

## 2018-03-17 DIAGNOSIS — I255 Ischemic cardiomyopathy: Secondary | ICD-10-CM | POA: Diagnosis present

## 2018-03-17 DIAGNOSIS — I252 Old myocardial infarction: Secondary | ICD-10-CM

## 2018-03-17 DIAGNOSIS — R42 Dizziness and giddiness: Secondary | ICD-10-CM | POA: Diagnosis not present

## 2018-03-17 DIAGNOSIS — Z8249 Family history of ischemic heart disease and other diseases of the circulatory system: Secondary | ICD-10-CM | POA: Diagnosis not present

## 2018-03-17 DIAGNOSIS — I509 Heart failure, unspecified: Secondary | ICD-10-CM | POA: Diagnosis not present

## 2018-03-17 DIAGNOSIS — Z7901 Long term (current) use of anticoagulants: Secondary | ICD-10-CM

## 2018-03-17 DIAGNOSIS — I2510 Atherosclerotic heart disease of native coronary artery without angina pectoris: Secondary | ICD-10-CM | POA: Diagnosis present

## 2018-03-17 DIAGNOSIS — H9190 Unspecified hearing loss, unspecified ear: Secondary | ICD-10-CM | POA: Diagnosis present

## 2018-03-17 DIAGNOSIS — M19042 Primary osteoarthritis, left hand: Secondary | ICD-10-CM | POA: Diagnosis present

## 2018-03-17 DIAGNOSIS — Z951 Presence of aortocoronary bypass graft: Secondary | ICD-10-CM

## 2018-03-17 DIAGNOSIS — R0902 Hypoxemia: Secondary | ICD-10-CM | POA: Diagnosis not present

## 2018-03-17 DIAGNOSIS — Z96642 Presence of left artificial hip joint: Secondary | ICD-10-CM | POA: Diagnosis present

## 2018-03-17 DIAGNOSIS — I5022 Chronic systolic (congestive) heart failure: Secondary | ICD-10-CM | POA: Diagnosis present

## 2018-03-17 DIAGNOSIS — G8929 Other chronic pain: Secondary | ICD-10-CM | POA: Diagnosis present

## 2018-03-17 DIAGNOSIS — E785 Hyperlipidemia, unspecified: Secondary | ICD-10-CM | POA: Diagnosis present

## 2018-03-17 DIAGNOSIS — I447 Left bundle-branch block, unspecified: Secondary | ICD-10-CM | POA: Diagnosis present

## 2018-03-17 DIAGNOSIS — Z880 Allergy status to penicillin: Secondary | ICD-10-CM

## 2018-03-17 DIAGNOSIS — I361 Nonrheumatic tricuspid (valve) insufficiency: Secondary | ICD-10-CM | POA: Diagnosis not present

## 2018-03-17 DIAGNOSIS — M19041 Primary osteoarthritis, right hand: Secondary | ICD-10-CM | POA: Diagnosis present

## 2018-03-17 DIAGNOSIS — Z974 Presence of external hearing-aid: Secondary | ICD-10-CM

## 2018-03-17 DIAGNOSIS — R001 Bradycardia, unspecified: Secondary | ICD-10-CM | POA: Diagnosis not present

## 2018-03-17 DIAGNOSIS — Z87891 Personal history of nicotine dependence: Secondary | ICD-10-CM | POA: Diagnosis not present

## 2018-03-17 DIAGNOSIS — I370 Nonrheumatic pulmonary valve stenosis: Secondary | ICD-10-CM | POA: Diagnosis not present

## 2018-03-17 HISTORY — DX: Essential (primary) hypertension: I10

## 2018-03-17 HISTORY — DX: Bradycardia, unspecified: R00.1

## 2018-03-17 LAB — TROPONIN I: Troponin I: 0.16 ng/mL (ref <0.03)

## 2018-03-17 LAB — COMPREHENSIVE METABOLIC PANEL
ALT: 12 U/L (ref 0–44)
AST: 21 U/L (ref 15–41)
Albumin: 3.1 g/dL — ABNORMAL LOW (ref 3.5–5.0)
Alkaline Phosphatase: 93 U/L (ref 38–126)
Anion gap: 11 (ref 5–15)
BILIRUBIN TOTAL: 1.6 mg/dL — AB (ref 0.3–1.2)
BUN: 29 mg/dL — ABNORMAL HIGH (ref 8–23)
CO2: 21 mmol/L — ABNORMAL LOW (ref 22–32)
Calcium: 8.7 mg/dL — ABNORMAL LOW (ref 8.9–10.3)
Chloride: 109 mmol/L (ref 98–111)
Creatinine, Ser: 1.52 mg/dL — ABNORMAL HIGH (ref 0.61–1.24)
GFR calc Af Amer: 42 mL/min — ABNORMAL LOW (ref >60)
GFR calc non Af Amer: 37 mL/min — ABNORMAL LOW (ref >60)
Glucose, Bld: 104 mg/dL — ABNORMAL HIGH (ref 70–99)
Potassium: 4.4 mmol/L (ref 3.5–5.1)
Sodium: 141 mmol/L (ref 135–145)
Total Protein: 6.3 g/dL — ABNORMAL LOW (ref 6.5–8.1)

## 2018-03-17 LAB — CBC WITH DIFFERENTIAL/PLATELET
Abs Immature Granulocytes: 0.03 10*3/uL (ref 0.00–0.07)
Basophils Absolute: 0 10*3/uL (ref 0.0–0.1)
Basophils Relative: 0 %
Eosinophils Absolute: 0 10*3/uL (ref 0.0–0.5)
Eosinophils Relative: 0 %
HCT: 41.5 % (ref 39.0–52.0)
HEMOGLOBIN: 12.6 g/dL — AB (ref 13.0–17.0)
Immature Granulocytes: 0 %
LYMPHS PCT: 12 %
Lymphs Abs: 1.1 10*3/uL (ref 0.7–4.0)
MCH: 30.3 pg (ref 26.0–34.0)
MCHC: 30.4 g/dL (ref 30.0–36.0)
MCV: 99.8 fL (ref 80.0–100.0)
Monocytes Absolute: 0.9 10*3/uL (ref 0.1–1.0)
Monocytes Relative: 10 %
Neutro Abs: 6.9 10*3/uL (ref 1.7–7.7)
Neutrophils Relative %: 78 %
PLATELETS: 218 10*3/uL (ref 150–400)
RBC: 4.16 MIL/uL — ABNORMAL LOW (ref 4.22–5.81)
RDW: 15.1 % (ref 11.5–15.5)
WBC: 9 10*3/uL (ref 4.0–10.5)
nRBC: 0 % (ref 0.0–0.2)

## 2018-03-17 LAB — PROTIME-INR
INR: 1.84
PROTHROMBIN TIME: 21 s — AB (ref 11.4–15.2)

## 2018-03-17 LAB — BRAIN NATRIURETIC PEPTIDE: B Natriuretic Peptide: 808 pg/mL — ABNORMAL HIGH (ref 0.0–100.0)

## 2018-03-17 LAB — CBG MONITORING, ED: Glucose-Capillary: 92 mg/dL (ref 70–99)

## 2018-03-17 LAB — TSH: TSH: 1.839 u[IU]/mL (ref 0.350–4.500)

## 2018-03-17 LAB — MAGNESIUM: Magnesium: 2.2 mg/dL (ref 1.7–2.4)

## 2018-03-17 LAB — MRSA PCR SCREENING: MRSA by PCR: NEGATIVE

## 2018-03-17 MED ORDER — ASPIRIN 81 MG PO CHEW
324.0000 mg | CHEWABLE_TABLET | Freq: Once | ORAL | Status: AC
  Administered 2018-03-17: 324 mg via ORAL
  Filled 2018-03-17: qty 4

## 2018-03-17 MED ORDER — NITROGLYCERIN 0.4 MG SL SUBL: 0.4000 mg | SUBLINGUAL_TABLET | SUBLINGUAL | Status: DC | PRN

## 2018-03-17 MED ORDER — ONDANSETRON HCL 4 MG/2ML IJ SOLN: 4.0000 mg | Freq: Four times a day (QID) | INTRAMUSCULAR | Status: DC | PRN

## 2018-03-17 MED ORDER — ORAL CARE MOUTH RINSE
15.0000 mL | Freq: Two times a day (BID) | OROMUCOSAL | Status: DC
  Administered 2018-03-17 – 2018-03-19 (×4): 15 mL via OROMUCOSAL

## 2018-03-17 NOTE — ED Triage Notes (Signed)
Arrived via EMS from home. Patient became dizzy while getting up to go to the bathroom. EMS reported patient HR 35 alert answering and following commands appropriate.

## 2018-03-17 NOTE — ED Notes (Signed)
Placed patient on ZOLL.  

## 2018-03-17 NOTE — ED Notes (Signed)
Report attempted 

## 2018-03-17 NOTE — H&P (Addendum)
Cardiology Admission History and Physical:   Patient ID: Justin [name] MRN: [id number]; DOB: March 06, 1913   Admission date: 03/17/2018  Primary Care Provider: Carol Ada, MD Primary Cardiologist: Dr. Wynonia Lawman Primary Electrophysiologist:  None   Chief Complaint:  Symptomatic bradycardia  Patient Profile:   Justin [name] is a 86 y.o. male with PMHx of CAD (CABG 1988), BPH, HTN, HLD, macular degeneration, and AFib  History of Present Illness:   Justin [name] was in his USOH which is fairly independent with "aches and pains" lives with his daughter who helps him, until 4 days ago that he started to feel unusually winded with his usual activities and then today when he was walking to the BR became lightheaded, and was able to get back to his chair to sit and called his daughter.  He denies any kind of cardiac awareness, has not had any kind of CP, palpitations.  They called EMS, on arrival he was found bradycardic, rates 30's  LABS K+ 4.4 BUN/Creat 29/1.52 Mag 2.2 Trop I: 0.16 BNP 808 WBC 9.0 H/H 12/41 Plts 218  He is seen in the ER, at rest feels OK, denies any dizziness, lightheadedness.  No CP, palpitations.  BP is 123/87   Past Medical History:  Diagnosis Date  . Hypertension     Past Surgical History:  Procedure Laterality Date  . CORONARY ARTERY BYPASS GRAFT       Medications Prior to Admission: Prior to Admission medications   Medication Sig Start Date End Date Taking? Authorizing Provider  atorvastatin (LIPITOR) 20 MG tablet  08/11/16   [provider]  ELIQUIS 2.5 MG TABS tablet  09/08/16   [provider]  finasteride (PROSCAR) 5 MG tablet  07/09/16   [provider]  KLOR-CON M20 20 MEQ tablet  09/15/16   [provider]  metoprolol tartrate (LOPRESSOR) 50 MG tablet  08/11/16   [provider]  tamsulosin (FLOMAX) 0.4 MG CAPS capsule  07/28/16   [provider]  torsemide (DEMADEX) 20 MG tablet  08/11/16   [provider]     Allergies:    Allergies  Allergen Reactions  . Penicillins Rash    Social History:   Social History   Socioeconomic History  . Marital status: Married    Spouse name: Not on file  . Number of children: Not on file  . Years of education: Not on file  . Highest education level: Not on file  Occupational History  . Not on file  Social Needs  . Financial resource strain: Not on file  . Food insecurity:    Worry: Not on file    Inability: Not on file  . Transportation needs:    Medical: Not on file    Non-medical: Not on file  Tobacco Use  . Smoking status: Former Smoker    Last attempt to quit: 09/20/1966    Years since quitting: 51.5  . Smokeless tobacco: Never Used  Substance and Sexual Activity  . Alcohol use: No  . Drug use: No  . Sexual activity: Not on file  Lifestyle  . Physical activity:    Days per week: Not on file    Minutes per session: Not on file  . Stress: Not on file  Relationships  . Social connections:    Talks on phone: Not on file    Gets together: Not on file    Attends religious service: Not on file    Active member of club or organization:  Not on file    Attends meetings of clubs or organizations: Not on file    Relationship status: Not on file  . Intimate partner violence:    Fear of current or ex partner: Not on file    Emotionally abused: Not on file    Physically abused: Not on file    Forced sexual activity: Not on file  Other Topics Concern  . Not on file  Social History Narrative  . Not on file    Family History: The patient's parents both died when he was 75, he thinks from "black lung"  ROS:  Please see the history of present illness.  All other ROS reviewed and negative.     Physical Exam/Data:   Vitals:   03/17/18 1045 03/17/18 1100 03/17/18 1115 03/17/18 1130  BP: (!) 128/54 (!) 140/112 123/85 123/64  Pulse: (!) 32 (!) 28 (!) 33 (!) 33  Resp: 16 14 16  (!) 24  Temp:      TempSrc:      SpO2: 100%  99% 99% 100%  Weight:      Height:       No intake or output data in the 24 hours ending 03/17/18 1154 Last 3 Weights 03/17/2018  Weight (lbs) 130 lb  Weight (kg) 58.968 kg     Body mass index is 23.03 kg/m.  General:  Well nourished, well developed, in no acute distress.   HEENT: normal Lymph: no adenopathy Neck: no JVD Endocrine:  No thryomegaly Vascular: No carotid bruits; FA pulses 2+ bilaterally without bruits  Cardiac: irreg-irreg, bradycardic; soft SM, no gallops or rubs,  His extremities are warm Lungs:  CTA b/l, no wheezing, rhonchi or rales  Abd: soft, nontender Ext: no edema Musculoskeletal:  No deformities, age appropriate atrophy Skin: warm and dry  Neuro:  no focal abnormalities noted Psych:  Normal affect    EKG:  The ECG that was done today is likely AFib, 36bpm, , poss RBBB, has likely some junctional escape beats, with differing morphologieswas personally reviewed   Relevant CV Studies:  No historical cardiac data is available  Laboratory Data:  Chemistry Recent Labs  Lab 03/17/18 0959  NA 141  K 4.4  CL 109  CO2 21*  GLUCOSE 104*  BUN 29*  CREATININE 1.52*  CALCIUM 8.7*  GFRNONAA 37*  GFRAA 42*  ANIONGAP 11    Recent Labs  Lab 03/17/18 0959  PROT 6.3*  ALBUMIN 3.1*  AST 21  ALT 12  ALKPHOS 93  BILITOT 1.6*   Hematology Recent Labs  Lab 03/17/18 0959  WBC 9.0  RBC 4.16*  HGB 12.6*  HCT 41.5  MCV 99.8  MCH 30.3  MCHC 30.4  RDW 15.1  PLT 218   Cardiac Enzymes Recent Labs  Lab 03/17/18 0959  TROPONINI 0.16*   No results for input(s): TROPIPOC in the last 168 hours.  BNP Recent Labs  Lab 03/17/18 0959  BNP 808.0*    DDimer No results for input(s): DDIMER in the last 168 hours.  Radiology/Studies:   Dg Chest Portable 1 View Result Date: 03/17/2018 CLINICAL DATA:  Chest pain EXAM: PORTABLE CHEST 1 VIEW COMPARISON:  06/21/2013 FINDINGS: Chronic cardiomegaly. CABG. There is haziness of the right lower chest with small  volume pleural fluid at least on the right. Large lung volumes. No generalized Kerley lines. No pneumothorax. Osteopenia. IMPRESSION: 1. Asymmetric increased density at the right base could be atelectasis or infection depending on the clinical circumstances. 2. Chronic cardiomegaly.  Small  right pleural effusion. Electronically Signed   By: Monte Fantasia M.D.   On: 03/17/2018 10:27    Assessment and Plan:   1. Symptomatic bradycardia     Listed medicines report metoprolol tartrate, though his daughter has his pill bottles is metoprolol succinate 50mg  1/2  Tab BID     His last dose was last night at 2000     1/2  Life is 5-7 hours, total washout will be after 8pm tonight  His heart rhythm on tele is irregular and likely AFib, generally 30's-40, though dips to high 20's transiently as well as into the 80's     He is mentating very well, BP is stable  Will admit to ICU for drug washout, he has zoll pads on already Pt is seen with Dr. Acie Fredrickson, not felt to need temp wire at this time   Echo  today  2. AFib     CHA2DS2Vasc is 4, on Eliquis at home 2.5mg  appropriately dosed     Will hold today with poss need for PPM tomorrow  3. HTN     No meds for now, BP 120's/80's  4. BPH     Continue home meds  5. Chronic generalized body/joint pains     Continue home tramadol  6. Renal insufficiency     Unknown baseline     not unreasonable for 85 y/o     BMET I n AM    Severity of Illness: The appropriate patient status for this patient is INPATIENT. Inpatient status is judged to be reasonable and necessary in order to provide the required intensity of service to ensure the patient's safety. The patient's presenting symptoms, physical exam findings, and initial radiographic and laboratory data in the context of their chronic comorbidities is felt to place them at high risk for further clinical deterioration. Furthermore, it is not anticipated that the patient will be medically stable for  discharge from the hospital within 2 midnights of admission. The following factors support the patient status of inpatient.   " The patient's presenting symptoms include weakness, symptomatic bradycardia. " The chronic co-morbidities include CAD, advanced age, AFib.   * I certify that at the point of admission it is my clinical judgment that the patient will require inpatient hospital care spanning beyond 2 midnights from the point of admission due to high intensity of service, high risk for further deterioration and high frequency of surveillance required.*    For questions or updates, please contact [place] Please consult [email or web address] for contact info under        Signed, Baldwin Jamaica, PA-C  03/17/2018 11:54 AM   Attending Note:   The patient was seen and examined.  Agree with assessment and plan as noted above.  Changes made to the above note as needed.  Patient seen and independently examined with Marinus Maw, PA .   We discussed all aspects of the encounter. I agree with the assessment and plan as stated above.  1.  Symptomatic bradycardia: Patient presents with a complaint of not feeling well.  He has atrial fibrillation and now presents with a heart rate in the 20s to 30s.  When talking  the patient's heart rate will increase to the 50s to 60s.  He denies any syncope or presyncope.  He is mentating well.  His renal function remained stable.  He is on Toprol-XL 50 mg twice a day. TSH is pending.  At this point I do not think that there is any  indication for temporary pacer.  He is 85 years old and I would like to avoid invasive procedures if possible.  We will allow his Toprol-XL to washout and see what his heart rate is tomorrow. He denies any chest pain or shortness of breath. He is on Eliquis.  We will hold his Eliquis temporarily in case he needs pacemaker placement.  2.  Atrial fibrillation: The patient has a long history of atrial fibrillation and is on  Eliquis.  He has been rate controlled on metoprolol but now his heart rate is extremely slow.  We will hold metoprolol for now.    I have spent a total of 40 minutes with patient reviewing hospital  notes , telemetry, EKGs, labs and examining patient as well as establishing an assessment and plan that was discussed with the patient. > 50% of time was spent in direct patient care.    Thayer Headings, Brooke Bonito., MD, [facility] 03/17/2018, 12:23 PM [address],  [place] Pager [phone]

## 2018-03-17 NOTE — ED Notes (Signed)
Notified doctor about patient's hear rate doctor at bedside.

## 2018-03-17 NOTE — Progress Notes (Addendum)
I have called Dr. Thurman Coyer office x2 to try and obtain medical records for the patient, unable to remotely log in.  Will continue to try and obtain his records.  To clarify.  His pill bottle is metoprolol succinate 50mg  says 1/2 tab to take once daily, and also says twice daily.  The patient's daughter confirmed, she gives him 1/2 tab 25mg  twice daily (for years)  Tommye Standard, [place]

## 2018-03-17 NOTE — ED Notes (Addendum)
Troponin reported to Cards PA.

## 2018-03-17 NOTE — ED Provider Notes (Signed)
[facility] EMERGENCY DEPARTMENT Provider Note   CSN: [id number] Arrival date & time: 03/17/18  8099     History   Chief Complaint Chief Complaint  Patient presents with  . Dizziness  . Bradycardia    HPI Justin [name] is a 85 y.o. male.  HPI Presents with dyspnea. Patient is initially alone, arrives via EMS, but soon joined by his daughter. Patient denies pain. He states that he has been taking medication as directed until today. On review the patient's medications include Eliquis, and a beta-blocker. He notes that over the past few days he has had mild dyspnea at rest, but with any activity he has new dyspnea. He denies history of fever, has no substantial cough. Patient is elderly, has poor hearing, but seems to provide details appropriately, and details are corroborated by his daughter upon her arrival. Patient has not seen his cardiologist in some time, states that he had been doing generally well until this event, has not seen his primary care physician either.  Past Medical History:  Diagnosis Date  . Hypertension     There are no active problems to display for this patient.   Past Surgical History:  Procedure Laterality Date  . CORONARY ARTERY BYPASS GRAFT          Home Medications    Prior to Admission medications   Medication Sig Start Date End Date Taking? Authorizing Provider  atorvastatin (LIPITOR) 20 MG tablet  08/11/16   [provider]  ELIQUIS 2.5 MG TABS tablet  09/08/16   [provider]  finasteride (PROSCAR) 5 MG tablet  07/09/16   [provider]  KLOR-CON M20 20 MEQ tablet  09/15/16   [provider]  metoprolol tartrate (LOPRESSOR) 50 MG tablet  08/11/16   [provider]  tamsulosin (FLOMAX) 0.4 MG CAPS capsule  07/28/16   [provider]  torsemide (DEMADEX) 20 MG tablet  08/11/16   [provider]    Family History No family history on file.  Social  History Social History   Tobacco Use  . Smoking status: Former Smoker    Last attempt to quit: 09/20/1966    Years since quitting: 51.5  . Smokeless tobacco: Never Used  Substance Use Topics  . Alcohol use: No  . Drug use: No     Allergies   Penicillins   Review of Systems Review of Systems  Constitutional:       Per HPI, otherwise negative  HENT:       Per HPI, otherwise negative  Respiratory:       Per HPI, otherwise negative  Cardiovascular:       Per HPI, otherwise negative  Gastrointestinal: Negative for vomiting.  Endocrine:       Negative aside from HPI  Genitourinary:       Neg aside from HPI   Musculoskeletal:       Per HPI, otherwise negative  Skin: Negative.   Neurological: Negative for syncope.     Physical Exam Updated Vital Signs BP 113/70   Pulse (!) 28   Temp (!) 97.5 F (36.4 C) (Oral)   Resp 16   Ht 5\' 3"  (1.6 m)   Wt 59 kg   SpO2 98%   BMI 23.03 kg/m   Physical Exam Vitals signs and nursing note reviewed.  Constitutional:      General: He is not in acute distress.    Appearance: He is well-developed.     Comments:  Elderly appearing male awake and alert, answering questions briefly, appropriately.  HENT:     Head: Normocephalic and atraumatic.  Eyes:     Conjunctiva/sclera: Conjunctivae normal.  Cardiovascular:     Rate and Rhythm: Bradycardia present.     Comments: Bradycardia, with inconsistent rate, pulses appreciable in the distal extremities. Pulmonary:     Effort: Pulmonary effort is normal. No respiratory distress.     Breath sounds: Decreased air movement present. No stridor.  Abdominal:     General: There is no distension.  Skin:    General: Skin is warm and dry.  Neurological:     Mental Status: He is alert and oriented to person, place, and time.     Motor: Atrophy present.      ED Treatments / Results  Labs (all labs ordered are listed, but only abnormal results are displayed) Labs Reviewed  CBC WITH  DIFFERENTIAL/PLATELET - Abnormal; Notable for the following components:      Result Value   RBC 4.16 (*)    Hemoglobin 12.6 (*)    All other components within normal limits  PROTIME-INR - Abnormal; Notable for the following components:   Prothrombin Time 21.0 (*)    All other components within normal limits  COMPREHENSIVE METABOLIC PANEL  MAGNESIUM  TROPONIN I  BRAIN NATRIURETIC PEPTIDE  CBG MONITORING, ED    EKG EKG Interpretation  Date/Time:  Tuesday March 17 2018 09:55:38 EST Ventricular Rate:  36 PR Interval:    QRS Duration: 153 QT Interval:  658 QTC Calculation: 473 R Axis:   -69 Text Interpretation:  Junctional rhythm Multiple ventricular premature complexes Baseline wander Artifact Abnormal ekg Confirmed by Carmin Muskrat [phone]) on 03/17/2018 11:44:34 AM   Radiology Dg Chest Portable 1 View  Result Date: 03/17/2018 CLINICAL DATA:  Chest pain EXAM: PORTABLE CHEST 1 VIEW COMPARISON:  06/21/2013 FINDINGS: Chronic cardiomegaly. CABG. There is haziness of the right lower chest with small volume pleural fluid at least on the right. Large lung volumes. No generalized Kerley lines. No pneumothorax. Osteopenia. IMPRESSION: 1. Asymmetric increased density at the right base could be atelectasis or infection depending on the clinical circumstances. 2. Chronic cardiomegaly.  Small right pleural effusion. Electronically Signed   By: Monte Fantasia M.D.   On: 03/17/2018 10:27    Procedures Procedures (including critical care time)  Medications Ordered in ED Medications  aspirin chewable tablet 324 mg (324 mg Oral Given 03/17/18 1018)     Initial Impression / Assessment and Plan / ED Course  I have reviewed the triage vital signs and the nursing notes.  Pertinent labs & imaging results that were available during my care of the patient were reviewed by me and considered in my medical decision making (see chart for details). Initial evaluation the patient was placed on  continuous cardiac monitoring, had labs drawn, EKG obtained. EKG consistent with monitoring, concerning for junctional versus complete heart block.  Patient's dyspnea, bradycardia, concern for possible heart block, discussed possibility of pacemaker, to which the patient is amenable.  Patient is on beta-blocker, has had his lowest dose yesterday, this may be contributing to the patient's bradycardia, arrhythmia.      12:39 PM Patient seen and evaluated by our cardiology colleagues. With concern for symptomatic bradycardia, the patient had pacer pads applied, required admission for continuous cardiac monitoring, consideration of pacemaker as needed Patient admitted to the cardiac ICU.  Final Clinical Impressions(s) / ED Diagnoses  Symptomatic bradycardia  CRITICAL CARE Performed by: Carmin Muskrat Total  critical care time: 40 minutes Critical care time was exclusive of separately billable procedures and treating other patients. Critical care was necessary to treat or prevent imminent or life-threatening deterioration. Critical care was time spent personally by me on the following activities: development of treatment plan with patient and/or surrogate as well as nursing, discussions with consultants, evaluation of patient's response to treatment, examination of patient, obtaining history from patient or surrogate, ordering and performing treatments and interventions, ordering and review of laboratory studies, ordering and review of radiographic studies, pulse oximetry and re-evaluation of patient's condition.    Carmin Muskrat, MD 03/17/18 1240

## 2018-03-18 ENCOUNTER — Ambulatory Visit: Payer: Medicare Other | Admitting: Urology

## 2018-03-18 ENCOUNTER — Inpatient Hospital Stay (HOSPITAL_COMMUNITY)
Admission: EM | Disposition: A | Discharge status: Home / Self Care | Payer: Self-pay | Source: Home / Self Care | Attending: Cardiovascular Disease

## 2018-03-18 ENCOUNTER — Inpatient Hospital Stay (HOSPITAL_COMMUNITY): Payer: Medicare Other

## 2018-03-18 DIAGNOSIS — I442 Atrioventricular block, complete: Principal | ICD-10-CM

## 2018-03-18 DIAGNOSIS — I361 Nonrheumatic tricuspid (valve) insufficiency: Secondary | ICD-10-CM

## 2018-03-18 DIAGNOSIS — I5022 Chronic systolic (congestive) heart failure: Secondary | ICD-10-CM

## 2018-03-18 DIAGNOSIS — I370 Nonrheumatic pulmonary valve stenosis: Secondary | ICD-10-CM

## 2018-03-18 HISTORY — PX: BIV PACEMAKER INSERTION CRT-P: EP1199

## 2018-03-18 LAB — ECHOCARDIOGRAM COMPLETE
Height: 63 in
Weight: 2080 oz

## 2018-03-18 LAB — BASIC METABOLIC PANEL
Anion gap: 10 (ref 5–15)
BUN: 31 mg/dL — ABNORMAL HIGH (ref 8–23)
CO2: 24 mmol/L (ref 22–32)
Calcium: 9.1 mg/dL (ref 8.9–10.3)
Chloride: 109 mmol/L (ref 98–111)
Creatinine, Ser: 1.72 mg/dL — ABNORMAL HIGH (ref 0.61–1.24)
GFR calc Af Amer: 36 mL/min — ABNORMAL LOW (ref >60)
GFR calc non Af Amer: 31 mL/min — ABNORMAL LOW (ref >60)
Glucose, Bld: 105 mg/dL — ABNORMAL HIGH (ref 70–99)
Potassium: 4 mmol/L (ref 3.5–5.1)
Sodium: 143 mmol/L (ref 135–145)

## 2018-03-18 LAB — GLUCOSE, CAPILLARY: Glucose-Capillary: 88 mg/dL (ref 70–99)

## 2018-03-18 SURGERY — BIV PACEMAKER INSERTION CRT-P

## 2018-03-18 MED ORDER — VANCOMYCIN HCL IN DEXTROSE 1-5 GM/200ML-% IV SOLN
INTRAVENOUS | Status: AC
  Filled 2018-03-18: qty 200

## 2018-03-18 MED ORDER — TORSEMIDE 20 MG PO TABS
20.0000 mg | ORAL_TABLET | Freq: Every day | ORAL | Status: DC
  Administered 2018-03-18 – 2018-03-19 (×2): 20 mg via ORAL
  Filled 2018-03-18 (×2): qty 1

## 2018-03-18 MED ORDER — METOPROLOL SUCCINATE ER 25 MG PO TB24
25.0000 mg | ORAL_TABLET | Freq: Two times a day (BID) | ORAL | Status: DC
  Administered 2018-03-18 – 2018-03-19 (×2): 25 mg via ORAL
  Filled 2018-03-18 (×2): qty 1

## 2018-03-18 MED ORDER — HEPARIN (PORCINE) IN NACL 1000-0.9 UT/500ML-% IV SOLN
INTRAVENOUS | Status: AC
  Filled 2018-03-18: qty 500

## 2018-03-18 MED ORDER — TRAMADOL-ACETAMINOPHEN 37.5-325 MG PO TABS
1.0000 | ORAL_TABLET | Freq: Every day | ORAL | Status: DC
  Administered 2018-03-18: 22:00:00 1 via ORAL
  Filled 2018-03-18: qty 1

## 2018-03-18 MED ORDER — YOU HAVE A PACEMAKER BOOK
Freq: Once | Status: AC
  Administered 2018-03-18: 22:00:00
  Filled 2018-03-18: qty 1

## 2018-03-18 MED ORDER — IOPAMIDOL (ISOVUE-370) INJECTION 76%
INTRAVENOUS | Status: DC | PRN
  Administered 2018-03-18: 10 mL via INTRAVENOUS

## 2018-03-18 MED ORDER — IOPAMIDOL (ISOVUE-370) INJECTION 76%
INTRAVENOUS | Status: AC
  Filled 2018-03-18: qty 50

## 2018-03-18 MED ORDER — LIDOCAINE HCL (PF) 1 % IJ SOLN
INTRAMUSCULAR | Status: DC | PRN
  Administered 2018-03-18: 45 mL

## 2018-03-18 MED ORDER — TRAMADOL-ACETAMINOPHEN 37.5-325 MG PO TABS: 1.0000 | ORAL_TABLET | Freq: Two times a day (BID) | ORAL | Status: DC | PRN

## 2018-03-18 MED ORDER — SODIUM CHLORIDE 0.9% FLUSH: 3.0000 mL | INTRAVENOUS | Status: DC | PRN

## 2018-03-18 MED ORDER — LIDOCAINE HCL (PF) 1 % IJ SOLN
INTRAMUSCULAR | Status: AC
  Filled 2018-03-18: qty 60

## 2018-03-18 MED ORDER — TAMSULOSIN HCL 0.4 MG PO CAPS
0.8000 mg | ORAL_CAPSULE | Freq: Every day | ORAL | Status: DC
  Administered 2018-03-18: 0.8 mg via ORAL
  Filled 2018-03-18: qty 2

## 2018-03-18 MED ORDER — ACETAMINOPHEN 500 MG PO TABS
1000.0000 mg | ORAL_TABLET | Freq: Every day | ORAL | Status: DC
  Administered 2018-03-18 – 2018-03-19 (×2): 1000 mg via ORAL
  Filled 2018-03-18 (×2): qty 2

## 2018-03-18 MED ORDER — CHLORHEXIDINE GLUCONATE 4 % EX LIQD
60.0000 mL | Freq: Once | CUTANEOUS | Status: AC
  Administered 2018-03-18: 4 via TOPICAL

## 2018-03-18 MED ORDER — SODIUM CHLORIDE 0.9% FLUSH
3.0000 mL | Freq: Two times a day (BID) | INTRAVENOUS | Status: DC
  Administered 2018-03-18: 3 mL via INTRAVENOUS

## 2018-03-18 MED ORDER — SODIUM CHLORIDE 0.9 % IV SOLN
250.0000 mL | INTRAVENOUS | Status: DC
  Administered 2018-03-18: 250 mL via INTRAVENOUS

## 2018-03-18 MED ORDER — SODIUM CHLORIDE 0.9 % IV SOLN
INTRAVENOUS | Status: DC
  Administered 2018-03-18: 11:00:00 via INTRAVENOUS

## 2018-03-18 MED ORDER — ONDANSETRON HCL 4 MG/2ML IJ SOLN: 4.0000 mg | Freq: Four times a day (QID) | INTRAMUSCULAR | Status: DC | PRN

## 2018-03-18 MED ORDER — ATORVASTATIN CALCIUM 10 MG PO TABS
20.0000 mg | ORAL_TABLET | Freq: Every day | ORAL | Status: DC
  Administered 2018-03-18: 20 mg via ORAL
  Filled 2018-03-18: qty 2

## 2018-03-18 MED ORDER — VANCOMYCIN HCL IN DEXTROSE 1-5 GM/200ML-% IV SOLN
1000.0000 mg | INTRAVENOUS | Status: AC
  Administered 2018-03-18: 1000 mg via INTRAVENOUS

## 2018-03-18 MED ORDER — POTASSIUM CHLORIDE CRYS ER 20 MEQ PO TBCR
20.0000 meq | EXTENDED_RELEASE_TABLET | Freq: Every day | ORAL | Status: DC
  Administered 2018-03-18 – 2018-03-19 (×2): 20 meq via ORAL
  Filled 2018-03-18 (×2): qty 1

## 2018-03-18 MED ORDER — ACETAMINOPHEN 325 MG PO TABS: 325.0000 mg | ORAL_TABLET | ORAL | Status: DC | PRN

## 2018-03-18 MED ORDER — SODIUM CHLORIDE 0.9 % IV SOLN
80.0000 mg | INTRAVENOUS | Status: AC
  Administered 2018-03-18: 80 mg

## 2018-03-18 MED ORDER — VANCOMYCIN HCL IN DEXTROSE 1-5 GM/200ML-% IV SOLN
1000.0000 mg | Freq: Two times a day (BID) | INTRAVENOUS | Status: AC
  Administered 2018-03-19: 1000 mg via INTRAVENOUS
  Filled 2018-03-18: qty 200

## 2018-03-18 MED ORDER — SODIUM CHLORIDE 0.9 % IV SOLN
INTRAVENOUS | Status: AC
  Filled 2018-03-18: qty 2

## 2018-03-18 MED ORDER — SENNA 8.6 MG PO TABS
1.0000 | ORAL_TABLET | ORAL | Status: DC
  Administered 2018-03-18: 8.6 mg via ORAL
  Filled 2018-03-18: qty 1

## 2018-03-18 MED ORDER — CHLORHEXIDINE GLUCONATE 4 % EX LIQD
60.0000 mL | Freq: Once | CUTANEOUS | Status: DC
  Filled 2018-03-18: qty 15

## 2018-03-18 MED ORDER — FINASTERIDE 5 MG PO TABS
5.0000 mg | ORAL_TABLET | Freq: Every day | ORAL | Status: DC
  Administered 2018-03-19: 10:00:00 5 mg via ORAL
  Filled 2018-03-18: qty 1

## 2018-03-18 MED FILL — Medication: Qty: 1 | Status: AC

## 2018-03-18 SURGICAL SUPPLY — 16 items
CABLE SURGICAL S-101-97-12 (CABLE) ×3 IMPLANT
CATH CPS DIRECT 135 DS2C020 (CATHETERS) ×2 IMPLANT
CATH CPS QUART SUB DS2N026-59 (CATHETERS) ×3 IMPLANT
CATH HEX JOS 2-5-2 65CM 6F REP (CATHETERS) ×3 IMPLANT
CPS IMPLANT KIT 410190 (MISCELLANEOUS) ×2 IMPLANT
KIT ESSENTIALS PG (KITS) ×2 IMPLANT
KIT MICROPUNCTURE NIT STIFF (SHEATH) ×2 IMPLANT
LEAD QUICKFLEX 1258T-86 (Lead) ×2 IMPLANT
LEAD TENDRIL MRI 58CM LPA1200M (Lead) ×2 IMPLANT
PACEMAKER ASSURITY DR-RF (Pacemaker) ×2 IMPLANT
PAD PRO RADIOLUCENT 2001M-C (PAD) ×3 IMPLANT
SHEATH CLASSIC 8F (SHEATH) ×2 IMPLANT
SHEATH WORLEY 9FR 62CM (SHEATH) ×2 IMPLANT
SLITTER UNIVERSAL DS2A003 (MISCELLANEOUS) ×2 IMPLANT
TRAY PACEMAKER INSERTION (PACKS) ×3 IMPLANT
WIRE LUGE 182CM (WIRE) ×2 IMPLANT

## 2018-03-18 NOTE — Consult Note (Addendum)
Cardiology Consultation:   Justin [name] ID: Justin Justin [name] MRN: [id number]; DOB: 1914/01/08  Admit date: 03/17/2018 Date of Consult: 03/18/2018  Primary Care Provider: Carol Ada, MD Primary Cardiologist: Dr. Wynonia Lawman Primary Electrophysiologist:  None    Justin [name] Profile:   Justin Justin [name] is a 85 y.o. male with a hx of CAD (CABG 9937), chronic systolic heart failure, EF 30%,  BPH, HTN, HLD, macular degeneration, and AFib who is being seen today for Justin evaluation of symptomatic bradycardia at Justin request of Justin [name].  History of Present Illness:   Justin Justin [name] was in his USOH which is fairly independent with "aches and pains" lives with his daughter who helps him, until 4 days ago that Justin Justin [name] started to feel unusually winded with his usual activities and then today when Justin Justin [name] was walking to Justin BR became lightheaded, and was able to get back to his chair to sit and called his daughter.  Justin Justin [name] denies any kind of cardiac awareness, has not had any kind of CP, palpitations.  They called EMS, on arrival Justin Justin [name] was found bradycardic, rates 30's.  Justin Justin [name] was stable with no rest symptoms, stable BP, not felt to require temp pacing, admitted to ICU, his home metoprolol held.  LABS K+ 4.0 BUN/Creat 31/1.72 (trending up from yesterday, unknown baseline) TSH 1.839  WBC 9.0 H/H 12/41 Plts 218  Past Medical History:  Diagnosis Date  . Hypertension     Past Surgical History:  Procedure Laterality Date  . CORONARY ARTERY BYPASS GRAFT       Home Medications:  Prior to Admission medications   Medication Sig Start Date End Date Taking? Authorizing Provider  acetaminophen (TYLENOL) 500 MG tablet Take 1,000 mg by mouth daily.   Yes [provider]  atorvastatin (LIPITOR) 20 MG tablet Take 20 mg by mouth daily at 6 PM.  08/11/16  Yes [provider]  ELIQUIS 2.5 MG TABS tablet Take 2.5 mg by mouth 2 (two) times daily.  09/08/16  Yes [provider]  finasteride (PROSCAR) 5 MG tablet Take 5 mg by  mouth daily.  07/09/16  Yes [provider]  KLOR-CON M20 20 MEQ tablet Take 20 mEq by mouth daily.  09/15/16  Yes [provider]  metoprolol succinate (TOPROL-XL) 50 MG 24 hr tablet Take 25 mg by mouth 2 (two) times daily. 02/02/18  Yes [provider]  senna (SENOKOT) 8.6 MG TABS tablet Take 1 tablet by mouth every 3 (three) days.   Yes [provider]  tamsulosin (FLOMAX) 0.4 MG CAPS capsule Take 0.8 mg by mouth daily.  07/28/16  Yes [provider]  torsemide (DEMADEX) 20 MG tablet Take 20 mg by mouth daily.  08/11/16  Yes [provider]  traMADol-acetaminophen (ULTRACET) 37.5-325 MG tablet Take 1 tablet by mouth at bedtime.  02/08/18  Yes [provider]    Inpatient Medications: Scheduled Meds: . mouth rinse  15 mL Mouth Rinse BID   Continuous Infusions:  PRN Meds: nitroGLYCERIN, ondansetron (ZOFRAN) IV  Allergies:    Allergies  Allergen Reactions  . Penicillins Rash    Did it involve swelling of Justin face/tongue/throat, SOB, or low BP? No Did it involve sudden or severe rash/hives, skin peeling, or any reaction on Justin inside of your mouth or nose? No Did you need to seek medical attention at a hospital or doctor's office? No When did it last happen?40 years ago If all above answers are "NO", may proceed with cephalosporin use.    Social History:  Social History   Socioeconomic History  . Marital status: Married    Spouse name: Not on file  . Number of children: Not on file  . Years of education: Not on file  . Highest education level: Not on file  Occupational History  . Not on file  Social Needs  . Financial resource strain: Not on file  . Food insecurity:    Worry: Not on file    Inability: Not on file  . Transportation needs:    Medical: Not on file    Non-medical: Not on file  Tobacco Use  . Smoking status: Former Smoker    Last attempt to quit: 09/20/1966    Years since quitting: 51.5  .  Smokeless tobacco: Never Used  Substance and Sexual Activity  . Alcohol use: No  . Drug use: No  . Sexual activity: Not on file  Lifestyle  . Physical activity:    Days per week: Not on file    Minutes per session: Not on file  . Stress: Not on file  Relationships  . Social connections:    Talks on phone: Not on file    Gets together: Not on file    Attends religious service: Not on file    Active member of club or organization: Not on file    Attends meetings of clubs or organizations: Not on file    Relationship status: Not on file  . Intimate partner violence:    Fear of current or ex partner: Not on file    Emotionally abused: Not on file    Physically abused: Not on file    Forced sexual activity: Not on file  Other Topics Concern  . Not on file  Social History Narrative  . Not on file    Family History:   Justin Justin [name] reports that both his parents dies when Justin Justin [name] was 37, Justin Justin [name] thinks of black lung disease  ROS:  Please see Justin history of present illness.  All other ROS reviewed and negative.     Physical Exam/Data:   Vitals:   03/18/18 0300 03/18/18 0400 03/18/18 0500 03/18/18 0600  BP: (!) 134/56 (!) 136/58 (!) 141/96 122/72  Pulse: (!) 33 (!) 33  78  Resp: 17 13 15 14   Temp:  97.8 F (36.6 C)    TempSrc:  Axillary    SpO2: 100% (!) 84%  (!) 86%  Weight:      Height:        Intake/Output Summary (Last 24 hours) at 03/18/2018 0724 Last data filed at 03/18/2018 0500 Gross per 24 hour  Intake 140 ml  Output 100 ml  Net 40 ml   Last 3 Weights 03/17/2018  Weight (lbs) 130 lb  Weight (kg) 58.968 kg     Body mass index is 23.03 kg/m.  General:  Well nourished, well developed, in no acute distress HEENT: normal Lymph: no adenopathy Neck: no JVD Endocrine:  No thryomegaly Vascular: No carotid bruits Cardiac: irreg-irreg, bradycardic; no murmurs, gallops or rubs Lungs:  CTA b/l, no wheezing, rhonchi or rales  Abd: soft, nontender  Ext: no  edema Musculoskeletal:  No deformities, age appropriate atrophy Skin: warm and dry  Neuro: upon waking this morning Justin Justin [name] required re-orientation, though once fully awake, did recall coming to Justin hospital, and why.  no gross focal abnormalities noted Psych:  Normal affect, very plesent  EKG:  Justin EKG was personally reviewed and demonstrates:   AFib, 36bpm, , poss RBBB, has likely some  junctional escape beats, with differing morphologieswas personally reviewed  Telemetry:  Telemetry was personally reviewed and demonstrates:   AFib generally 30's    Relevant CV Studies:  Unable to now to obtain out Justin [name] records, echo is ordered stat this AM  Laboratory Data:  Chemistry Recent Labs  Lab 03/17/18 0959 03/18/18 0335  NA 141 143  K 4.4 4.0  CL 109 109  CO2 21* 24  GLUCOSE 104* 105*  BUN 29* 31*  CREATININE 1.52* 1.72*  CALCIUM 8.7* 9.1  GFRNONAA 37* 31*  GFRAA 42* 36*  ANIONGAP 11 10    Recent Labs  Lab 03/17/18 0959  PROT 6.3*  ALBUMIN 3.1*  AST 21  ALT 12  ALKPHOS 93  BILITOT 1.6*   Hematology Recent Labs  Lab 03/17/18 0959  WBC 9.0  RBC 4.16*  HGB 12.6*  HCT 41.5  MCV 99.8  MCH 30.3  MCHC 30.4  RDW 15.1  PLT 218   Cardiac Enzymes Recent Labs  Lab 03/17/18 0959  TROPONINI 0.16*   No results for input(s): TROPIPOC in Justin last 168 hours.  BNP Recent Labs  Lab 03/17/18 0959  BNP 808.0*    DDimer No results for input(s): DDIMER in Justin last 168 hours.  Radiology/Studies:  Dg Chest Portable 1 View Result Date: 03/17/2018 CLINICAL DATA:  Chest pain EXAM: PORTABLE CHEST 1 VIEW COMPARISON:  06/21/2013 FINDINGS: Chronic cardiomegaly. CABG. There is haziness of Justin right lower chest with small volume pleural fluid at least on Justin right. Large lung volumes. No generalized Kerley lines. No pneumothorax. Osteopenia. IMPRESSION: 1. Asymmetric increased density at Justin right base could be atelectasis or infection depending on Justin clinical circumstances. 2. Chronic  cardiomegaly.  Small right pleural effusion. Electronically Signed   By: Monte Fantasia M.D.   On: 03/17/2018 10:27    Assessment and Plan:   1. Symptomatic bradycardia     Listed medicines report metoprolol tartrate, though his daughter has his pill bottles is metoprolol succinate 50mg  1/2  Tab BID     His last dose was 03/16/2018 night at 2000     It is Justin Justin [name]'s daughter's understanding that Justin Justin [name] is not always in AFib, reports Dr. Wynonia Lawman always telling him his heart sounded "good"  I will continue to try and obtain records Echo  today  PPM today.  I re-visited with Justin Justin [name] and daughter recommendation of PPM and rational, discussed Justin implant procedure, potential risks/benefits, they are both agreeable to proceed.  Dr. Lovena Le will see later this AM  2. AFib     CHA2DS2Vasc is 4, on Eliquis at home 2.5mg  appropriately dosed     continue to hold for PPM today  3. HTN     No meds for now, BP remain stable  4. BPH     Continue home meds  5. Chronic generalized body/joint pains     Continue home tramadol  6. Renal insufficiency     Unknown baseline     trending up some, likely 2/2 bradycardia, + urine OP last night  7. CAD     No anginal complaints   For questions or updates, please contact [place] Please consult [email or web address] for contact info under     Signed, Baldwin Jamaica, PA-C  03/18/2018 7:24 AM   EP Attending  Justin [name] seen and examined. Agree with above. Justin Justin [name] presents with symptomatic CHB and chronic atrial fib. His symptoms have been present for 5 days. Justin Justin [name] is amazingly independent of activities of daily  living. Justin Justin [name] has been off of his low dose beta blocker for over 5 half lives. I have discussed Justin indications/risks/benefits/goals/expectations of PPM insertion and Justin Justin [name] wishes to proceed. Justin Justin [name] has severe LV dysfunction by prior echo. I plan to use a dual chamber PPM and place a bipolar LV lead or a His bundle lead into his atrial port if  possible.   [name].D.

## 2018-03-18 NOTE — Progress Notes (Signed)
  Echocardiogram 2D Echocardiogram has been performed.  Justin [name] 03/18/2018, 8:50 AM

## 2018-03-18 NOTE — Discharge Instructions (Signed)
° ° °  Supplemental Discharge Instructions for  Pacemaker/Defibrillator Patients  Activity No heavy lifting or vigorous activity with your left/right arm for 6 to 8 weeks.  Do not raise your left/right arm above your head for one week.  Gradually raise your affected arm as drawn below.             03/22/2018                   03/23/2018                03/24/2018               03/25/2018 __  NO DRIVING (patient does not drive)  WOUND CARE - Keep the wound area clean and dry.  Do not get this area wet for one week. No showers for one week; you may shower on  03/25/2018   . - The tape/steri-strips on your wound will fall off; do not pull them off.  No bandage is needed on the site.  DO  NOT apply any creams, oils, or ointments to the wound area. - If you notice any drainage or discharge from the wound, any swelling or bruising at the site, or you develop a fever > 101? F after you are discharged home, call the office at once.  Special Instructions - You are still able to use cellular telephones; use the ear opposite the side where you have your pacemaker/defibrillator.  Avoid carrying your cellular phone near your device. - When traveling through airports, show security personnel your identification card to avoid being screened in the metal detectors.  Ask the security personnel to use the hand wand. - Avoid arc welding equipment, MRI testing (magnetic resonance imaging), TENS units (transcutaneous nerve stimulators).  Call the office for questions about other devices. - Avoid electrical appliances that are in poor condition or are not properly grounded. - Microwave ovens are safe to be near or to operate.

## 2018-03-19 ENCOUNTER — Inpatient Hospital Stay (HOSPITAL_COMMUNITY): Payer: Medicare Other

## 2018-03-19 ENCOUNTER — Other Ambulatory Visit: Payer: Self-pay

## 2018-03-19 ENCOUNTER — Encounter (HOSPITAL_COMMUNITY): Payer: Self-pay | Admitting: Internal Medicine

## 2018-03-19 LAB — BASIC METABOLIC PANEL
Anion gap: 12 (ref 5–15)
BUN: 27 mg/dL — ABNORMAL HIGH (ref 8–23)
CO2: 23 mmol/L (ref 22–32)
Calcium: 8.3 mg/dL — ABNORMAL LOW (ref 8.9–10.3)
Chloride: 108 mmol/L (ref 98–111)
Creatinine, Ser: 1.4 mg/dL — ABNORMAL HIGH (ref 0.61–1.24)
GFR calc Af Amer: 47 mL/min — ABNORMAL LOW (ref >60)
GFR calc non Af Amer: 40 mL/min — ABNORMAL LOW (ref >60)
Glucose, Bld: 128 mg/dL — ABNORMAL HIGH (ref 70–99)
Potassium: 3.2 mmol/L — ABNORMAL LOW (ref 3.5–5.1)
Sodium: 143 mmol/L (ref 135–145)

## 2018-03-19 MED ORDER — POTASSIUM CHLORIDE CRYS ER 20 MEQ PO TBCR
40.0000 meq | EXTENDED_RELEASE_TABLET | Freq: Once | ORAL | Status: AC
  Administered 2018-03-19: 08:00:00 40 meq via ORAL
  Filled 2018-03-19: qty 2

## 2018-03-19 MED ORDER — SODIUM CHLORIDE 0.9 % IV SOLN
INTRAVENOUS | Status: DC | PRN
  Administered 2018-03-19: 10 mL via INTRAVENOUS

## 2018-03-19 MED FILL — Heparin Sod (Porcine)-NaCl IV Soln 1000 Unit/500ML-0.9%: INTRAVENOUS | Qty: 500 | Status: AC

## 2018-03-19 MED FILL — Gentamicin Sulfate Inj 40 MG/ML: INTRAMUSCULAR | Qty: 80 | Status: AC

## 2018-03-19 NOTE — Discharge Summary (Addendum)
ELECTROPHYSIOLOGY PROCEDURE DISCHARGE SUMMARY    Patient ID: Justin [name],  MRN: [id number], DOB/AGE: 1913-10-08 85 y.o.  Admit date: 03/17/2018 Discharge date: 03/19/2018  Primary Care Physician: Carol Ada, MD  Primary Cardiologist: Dr. Wynonia Lawman Electrophysiologist: new to [facility], Dr. Lovena Le  Primary Discharge Diagnosis:  1. Symptomatic bradycardia status post pacemaker implantation this admission  Secondary Discharge Diagnosis:  1. Permanent Afib     CHA2DS2Vasc is 4, On Eliquis, appropriately dosed 2.5mg  2. CAD 3. ICM 4. HTN       Allergies  Allergen Reactions  . Penicillins Rash    Did it involve swelling of the face/tongue/throat, SOB, or low BP? No Did it involve sudden or severe rash/hives, skin peeling, or any reaction on the inside of your mouth or nose? No Did you need to seek medical attention at a hospital or doctor's office? No When did it last happen?40 years ago If all above answers are "NO", may proceed with cephalosporin use.     Procedures This Admission:  1.  Implantation of a SJM dual chamber PPM on 03/18/2018 by Dr Rayann Heman.  The patient received a Comptroller model [id number] (serial number [id number]) right ventricular lead, St. Jude(serial number [id number]) lead, and a St. Jude (serial number [id number]) pacemaker There were no immediate post procedure complications. 2.  CXR on 03/19/2018 demonstrated no pneumothorax status post device implantation.   Brief HPI: Justin [name] is a 85 y.o. male with PMHx of CAD (CABG 1988), BPH, HTN, HLD, macular degeneration, and AFib, was in his USOH which is fairly independent with "aches and pains" lives with his daughter who helps him (mostly 2/2 to his macular degeneration, until 4 days prior to coming in that he started to feel unusually winded with his usual activities and then the day of admission when he was walking to the BR became lightheaded, and was able to get back to his chair to sit and called his  daughter.  He denies any kind of cardiac awareness, has not had any kind of CP, palpitations.  They called EMS, on arrival he was found bradycardic, rates 30's  Hospital Course:  The patient was admitted to ICU, given stable BP and good mentation not felt to require temp pacing during washout of his home metoprolol.  Review of Dr. Thurman Coyer most recent office note , he described his AFib as chronic, and a known chronic CM.  TTE here noted LVEF 40-45%, RVSP elevated 64.58mmHg.  He remained bradycardic despite washout of betablocker and underwent implantation of a PPM with details as outlined above.  He was monitored on telemetry overnight which demonstrated VP aced rhythm.  Left chest was without hematoma or ecchymosis.  The device was interrogated and found to be functioning normally.  CXR was obtained and demonstrated no pneumothorax status post device implantation.  Wound care, arm mobility, and restrictions were reviewed with the patient and his daughter at bedside.   The patient reports brisk urine output last night, likely 2/2 improved cardiac output, and his home diuretic, note improvement in his Creat this AM, K+ is low, will give replacement and continue his home medicines as usual.  He has no SOB this AM, lungs are clear, no exam findings of fluid OL.  The patient denies CP, denies site discomfort.  He was examined by Dr. Lovena Le and considered stable for discharge to home.    Physical Exam: Vitals:   03/18/18 2157 03/19/18 0224 03/19/18 0225 03/19/18 0834  BP: 122/61 123/60  123/60 (!) 108/50  Pulse: 62 62 61 64  Resp: 15 17 17  (!) 22  Temp:  97.9 F (36.6 C)  98.1 F (36.7 C)  TempSrc:  Oral  Oral  SpO2: 97% 96% 96% 95%  Weight:  59.3 kg    Height:        GEN- The patient is well appearing, alert and oriented x 3 today.   HEENT: normocephalic, atraumatic; sclera clear, conjunctiva pink; hearing intact; oropharynx clear; neck supple, no JVP Lungs- CTA b/l, normal work of breathing.  No  wheezes, rales, rhonchi Heart- RRR (paced), no murmurs, rubs or gallops, PMI not laterally displaced GI- soft, non-tender, non-distended Extremities- no clubbing, cyanosis, or edema MS- no significant deformity, age appropriate atrophy Skin- warm and dry, no rash or lesion, left chest without hematoma/ecchymosis Psych- euthymic mood, full affect Neuro- no gross deficits   Labs:   Lab Results  Component Value Date   WBC 9.0 03/17/2018   HGB 12.6 (L) 03/17/2018   HCT 41.5 03/17/2018   MCV 99.8 03/17/2018   PLT 218 03/17/2018    Recent Labs  Lab 03/17/18 0959  03/19/18 0436  NA 141   < > 143  K 4.4   < > 3.2*  CL 109   < > 108  CO2 21*   < > 23  BUN 29*   < > 27*  CREATININE 1.52*   < > 1.40*  CALCIUM 8.7*   < > 8.3*  PROT 6.3*  --   --   BILITOT 1.6*  --   --   ALKPHOS 93  --   --   ALT 12  --   --   AST 21  --   --   GLUCOSE 104*   < > 128*   < > = values in this interval not displayed.    Discharge Medications:  Allergies as of 03/19/2018      Reactions   Penicillins Rash   Did it involve swelling of the face/tongue/throat, SOB, or low BP? No Did it involve sudden or severe rash/hives, skin peeling, or any reaction on the inside of your mouth or nose? No Did you need to seek medical attention at a hospital or doctor's office? No When did it last happen?40 years ago If all above answers are "NO", may proceed with cephalosporin use.      Medication List    TAKE these medications   acetaminophen 500 MG tablet Commonly known as:  TYLENOL Take 1,000 mg by mouth daily. Notes to patient:  Do not use this medicine with your tramadol that also contains acetaminophen   atorvastatin 20 MG tablet Commonly known as:  LIPITOR Take 20 mg by mouth daily at 6 PM.   ELIQUIS 2.5 MG Tabs tablet Generic drug:  apixaban Take 2.5 mg by mouth 2 (two) times daily. Notes to patient:  Do not resume until Monday 03/23/2018 evening dose   finasteride 5 MG tablet Commonly  known as:  PROSCAR Take 5 mg by mouth daily.   KLOR-CON M20 20 MEQ tablet Generic drug:  potassium chloride SA Take 20 mEq by mouth daily.   metoprolol succinate 50 MG 24 hr tablet Commonly known as:  TOPROL-XL Take 25 mg by mouth 2 (two) times daily.   senna 8.6 MG Tabs tablet Commonly known as:  SENOKOT Take 1 tablet by mouth every 3 (three) days.   tamsulosin 0.4 MG Caps capsule Commonly known as:  FLOMAX Take 0.8 mg by mouth  daily.   torsemide 20 MG tablet Commonly known as:  DEMADEX Take 20 mg by mouth daily.   traMADol-acetaminophen 37.5-325 MG tablet Commonly known as:  ULTRACET Take 1 tablet by mouth at bedtime.       Disposition:  Home Discharge Instructions    Diet - low sodium heart healthy   Complete by:  As directed    Increase activity slowly   Complete by:  As directed      Follow-up Information    [place] [facility] Follow up.   Specialty:  Cardiology Why:  03/30/2018 @ 10:30AM, wound check visit Contact information: [address] [place]       Evans Lance, MD Follow up.   Specialty:  Cardiology Why:  06/17/2018 @ 2:00PM Contact information: [address] [address] [phone]           Duration of Discharge Encounter: Greater than 30 minutes including physician time.  Venetia Night, PA-C 03/19/2018 8:41 AM  EP Attending Patient seen and examined. Agree with above. The patient is s/p PPM insertion. His CXR looks good. PM interrogation under my direction demonstrates normal device function. His incision looks good. He will be discharged home with usual followup.   [name].D.

## 2018-03-30 ENCOUNTER — Ambulatory Visit (INDEPENDENT_AMBULATORY_CARE_PROVIDER_SITE_OTHER): Payer: Medicare Other | Admitting: *Deleted

## 2018-03-30 ENCOUNTER — Ambulatory Visit
Admission: RE | Admit: 2018-03-30 | Discharge: 2018-03-30 | Disposition: A | Discharge status: Home / Self Care | Payer: Medicare Other | Source: Ambulatory Visit | Attending: Internal Medicine | Admitting: Internal Medicine

## 2018-03-30 ENCOUNTER — Telehealth: Payer: Self-pay

## 2018-03-30 DIAGNOSIS — R001 Bradycardia, unspecified: Secondary | ICD-10-CM

## 2018-03-30 DIAGNOSIS — J9811 Atelectasis: Secondary | ICD-10-CM | POA: Diagnosis not present

## 2018-03-30 DIAGNOSIS — J90 Pleural effusion, not elsewhere classified: Secondary | ICD-10-CM | POA: Diagnosis not present

## 2018-03-30 NOTE — Telephone Encounter (Signed)
Call back received from daughter.  Daughter advised of results of chest xray.  Advised Pt scheduled for lead revision 04/02/2018 with Dr. Lovena Le with arrival time of 7:30 am  Advised no breakfast  Advised to hold Eliquis until after procedure  Advised may take metoprolol, Flomax and Proscar AM of procedure with sip of water  All questions answered.  Daughter will pick up surgical scrub 03/31/2018

## 2018-03-31 ENCOUNTER — Ambulatory Visit: Payer: Medicare Other | Admitting: Urology

## 2018-04-02 ENCOUNTER — Ambulatory Visit (HOSPITAL_COMMUNITY)
Admission: RE | Admit: 2018-04-02 | Discharge: 2018-04-03 | Disposition: A | Discharge status: Home / Self Care | Payer: Medicare Other | Attending: Internal Medicine | Admitting: Internal Medicine

## 2018-04-02 ENCOUNTER — Other Ambulatory Visit: Payer: Self-pay

## 2018-04-02 ENCOUNTER — Ambulatory Visit (HOSPITAL_COMMUNITY)
Admission: RE | Disposition: A | Discharge status: Home / Self Care | Payer: Self-pay | Source: Home / Self Care | Attending: Internal Medicine

## 2018-04-02 DIAGNOSIS — Z880 Allergy status to penicillin: Secondary | ICD-10-CM | POA: Diagnosis not present

## 2018-04-02 DIAGNOSIS — I10 Essential (primary) hypertension: Secondary | ICD-10-CM | POA: Insufficient documentation

## 2018-04-02 DIAGNOSIS — I2510 Atherosclerotic heart disease of native coronary artery without angina pectoris: Secondary | ICD-10-CM | POA: Diagnosis not present

## 2018-04-02 DIAGNOSIS — R001 Bradycardia, unspecified: Secondary | ICD-10-CM | POA: Insufficient documentation

## 2018-04-02 DIAGNOSIS — T82120A Displacement of cardiac electrode, initial encounter: Secondary | ICD-10-CM | POA: Diagnosis not present

## 2018-04-02 DIAGNOSIS — Z950 Presence of cardiac pacemaker: Secondary | ICD-10-CM | POA: Insufficient documentation

## 2018-04-02 DIAGNOSIS — I4821 Permanent atrial fibrillation: Secondary | ICD-10-CM | POA: Insufficient documentation

## 2018-04-02 DIAGNOSIS — I442 Atrioventricular block, complete: Secondary | ICD-10-CM | POA: Diagnosis present

## 2018-04-02 DIAGNOSIS — Z87891 Personal history of nicotine dependence: Secondary | ICD-10-CM | POA: Insufficient documentation

## 2018-04-02 DIAGNOSIS — Z951 Presence of aortocoronary bypass graft: Secondary | ICD-10-CM | POA: Insufficient documentation

## 2018-04-02 HISTORY — PX: LEAD REVISION/REPAIR: EP1213

## 2018-04-02 LAB — POTASSIUM: Potassium: 3.2 mmol/L — ABNORMAL LOW (ref 3.5–5.1)

## 2018-04-02 LAB — SURGICAL PCR SCREEN
MRSA, PCR: NEGATIVE
Staphylococcus aureus: POSITIVE — AB

## 2018-04-02 SURGERY — LEAD REVISION/REPAIR
Anesthesia: LOCAL

## 2018-04-02 MED ORDER — VANCOMYCIN HCL IN DEXTROSE 1-5 GM/200ML-% IV SOLN
INTRAVENOUS | Status: AC
  Filled 2018-04-02: qty 200

## 2018-04-02 MED ORDER — SODIUM CHLORIDE 0.9 % IV SOLN
INTRAVENOUS | Status: DC
  Administered 2018-04-02: 08:00:00 via INTRAVENOUS

## 2018-04-02 MED ORDER — TRAMADOL-ACETAMINOPHEN 37.5-325 MG PO TABS
1.0000 | ORAL_TABLET | Freq: Every day | ORAL | Status: DC
  Administered 2018-04-02: 1 via ORAL
  Filled 2018-04-02: qty 1

## 2018-04-02 MED ORDER — LIDOCAINE HCL 1 % IJ SOLN
INTRAMUSCULAR | Status: AC
  Filled 2018-04-02: qty 20

## 2018-04-02 MED ORDER — HEPARIN (PORCINE) IN NACL 1000-0.9 UT/500ML-% IV SOLN
INTRAVENOUS | Status: DC | PRN
  Administered 2018-04-02: 500 mL

## 2018-04-02 MED ORDER — FINASTERIDE 5 MG PO TABS
5.0000 mg | ORAL_TABLET | Freq: Every day | ORAL | Status: DC
  Administered 2018-04-03: 5 mg via ORAL
  Filled 2018-04-02: qty 1

## 2018-04-02 MED ORDER — SODIUM CHLORIDE 0.9 % IV SOLN
80.0000 mg | INTRAVENOUS | Status: AC
  Administered 2018-04-02: 80 mg

## 2018-04-02 MED ORDER — ACETAMINOPHEN 500 MG PO TABS
1000.0000 mg | ORAL_TABLET | Freq: Every day | ORAL | Status: DC
  Administered 2018-04-02 – 2018-04-03 (×2): 1000 mg via ORAL
  Filled 2018-04-02 (×2): qty 2

## 2018-04-02 MED ORDER — SENNA 8.6 MG PO TABS
1.0000 | ORAL_TABLET | ORAL | Status: DC
  Administered 2018-04-03: 8.6 mg via ORAL
  Filled 2018-04-02: qty 1

## 2018-04-02 MED ORDER — POTASSIUM CHLORIDE CRYS ER 20 MEQ PO TBCR
20.0000 meq | EXTENDED_RELEASE_TABLET | Freq: Every day | ORAL | Status: DC
  Administered 2018-04-03: 20 meq via ORAL
  Filled 2018-04-02: qty 1

## 2018-04-02 MED ORDER — TAMSULOSIN HCL 0.4 MG PO CAPS
0.8000 mg | ORAL_CAPSULE | Freq: Every day | ORAL | Status: DC
  Administered 2018-04-03: 0.8 mg via ORAL
  Filled 2018-04-02: qty 2

## 2018-04-02 MED ORDER — ATORVASTATIN CALCIUM 10 MG PO TABS
20.0000 mg | ORAL_TABLET | Freq: Every day | ORAL | Status: DC
  Administered 2018-04-02: 20 mg via ORAL
  Filled 2018-04-02: qty 2

## 2018-04-02 MED ORDER — HEPARIN (PORCINE) IN NACL 1000-0.9 UT/500ML-% IV SOLN
INTRAVENOUS | Status: AC
  Filled 2018-04-02: qty 500

## 2018-04-02 MED ORDER — TORSEMIDE 20 MG PO TABS
20.0000 mg | ORAL_TABLET | Freq: Every day | ORAL | Status: DC
  Administered 2018-04-03: 20 mg via ORAL
  Filled 2018-04-02: qty 1

## 2018-04-02 MED ORDER — SODIUM CHLORIDE 0.9 % IV SOLN
INTRAVENOUS | Status: DC | PRN
  Administered 2018-04-02: 250 mL via INTRAVENOUS

## 2018-04-02 MED ORDER — LIDOCAINE HCL (PF) 1 % IJ SOLN
INTRAMUSCULAR | Status: DC | PRN
  Administered 2018-04-02: 45 mL

## 2018-04-02 MED ORDER — CHLORHEXIDINE GLUCONATE 4 % EX LIQD
60.0000 mL | Freq: Once | CUTANEOUS | Status: DC
  Filled 2018-04-02: qty 60

## 2018-04-02 MED ORDER — ONDANSETRON HCL 4 MG/2ML IJ SOLN: 4.0000 mg | Freq: Four times a day (QID) | INTRAMUSCULAR | Status: DC | PRN

## 2018-04-02 MED ORDER — SODIUM CHLORIDE 0.9 % IV SOLN
INTRAVENOUS | Status: AC
  Filled 2018-04-02: qty 2

## 2018-04-02 MED ORDER — ACETAMINOPHEN 325 MG PO TABS: 325.0000 mg | ORAL_TABLET | ORAL | Status: DC | PRN

## 2018-04-02 MED ORDER — VANCOMYCIN HCL IN DEXTROSE 1-5 GM/200ML-% IV SOLN
1000.0000 mg | Freq: Two times a day (BID) | INTRAVENOUS | Status: AC
  Administered 2018-04-02: 1000 mg via INTRAVENOUS
  Filled 2018-04-02: qty 200

## 2018-04-02 MED ORDER — MUPIROCIN 2 % EX OINT
TOPICAL_OINTMENT | CUTANEOUS | Status: AC
  Administered 2018-04-02: 08:00:00
  Filled 2018-04-02: qty 22

## 2018-04-02 MED ORDER — METOPROLOL SUCCINATE ER 25 MG PO TB24
25.0000 mg | ORAL_TABLET | Freq: Two times a day (BID) | ORAL | Status: DC
  Administered 2018-04-02 – 2018-04-03 (×2): 25 mg via ORAL
  Filled 2018-04-02 (×2): qty 1

## 2018-04-02 MED ORDER — VANCOMYCIN HCL IN DEXTROSE 1-5 GM/200ML-% IV SOLN
1000.0000 mg | INTRAVENOUS | Status: AC
  Administered 2018-04-02: 1000 mg via INTRAVENOUS

## 2018-04-02 SURGICAL SUPPLY — 5 items
CABLE SURGICAL S-101-97-12 (CABLE) ×2 IMPLANT
LEAD TENDRIL MRI 58CM LPA1200M (Lead) ×1 IMPLANT
PAD PRO RADIOLUCENT 2001M-C (PAD) ×2 IMPLANT
SHEATH CLASSIC 8F (SHEATH) ×1 IMPLANT
TRAY PACEMAKER INSERTION (PACKS) ×2 IMPLANT

## 2018-04-02 NOTE — Progress Notes (Signed)
Wound check appointment. Steri-strips removed. Wound without redness or edema. Incision edges approximated, wound well healed. LV lead in RA port. RV lead in RV port. LV threshold 2.25V @ 1.41ms, no capture noted on RV lead 8.00V@1 .63ms, per SK pt programmed AAI 70 w/ output 5.0V@1 .60ms, pt to obtain chest x ray and scheduled for lead revision 04/02/18 w/ GT

## 2018-04-02 NOTE — Discharge Summary (Addendum)
ELECTROPHYSIOLOGY PROCEDURE DISCHARGE SUMMARY    Patient ID: Justin [name],  MRN: [id number], DOB/AGE: 83-Jan-1915 83 y.o.  Admit date: 04/02/2018 Discharge date: 04/03/2018  Primary Care Physician: Carol Ada, MD  Primary Cardiologist: Dr. Wynonia Lawman >> [facility] Electrophysiologist: Dr. Lovena Le  Primary Discharge Diagnosis:  1. RV lead dislodgement  Secondary Discharge Diagnosis:  1. Symptomatic bradycardia      PPM 2. Permanent AFib     CHA2DS2Vasc is 4, on Eliquis, appropriately low dose 3/ CAD 4. ICM 5. HTN  Allergies  Allergen Reactions  . Penicillins Rash    Did it involve swelling of the face/tongue/throat, SOB, or low BP? No Did it involve sudden or severe rash/hives, skin peeling, or any reaction on the inside of your mouth or nose? No Did you need to seek medical attention at a hospital or doctor's office? No When did it last happen?40 years ago If all above answers are "NO", may proceed with cephalosporin use.     Procedures This Admission:  1.  Implantation of a new RV lead on 04/02/2018 by Dr Lovena Le.   There were no immediate post procedure complications. 2.  CXR on 04/03/2018 demonstrated no pneumothorax status post device implantation.   Brief HPI: Justin [name] is a 85 y.o. male was noted out patient at his wound check appointment to have no RV capture at high out puts. RV lead revision was recommended, risks, benefits, and alternatives to PPM implantation were reviewed with the patient who wished to proceed.   Hospital Course:  The patient was admitted and underwent implantation of a new RV lead (removal of old), see procedure report for full details.  He  was monitored on telemetry overnight which demonstrated V paced rhythm. Left chest/implant site has some hematoma, will hold his Eliquis until seen back at his wound check visit.  The device was interrogated and found to be functioning normally.  CXR was obtained and demonstrated no pneumothorax  status post device implantation, no pulmonary symptoms, no cough, SOB, no symptoms of illness.  Wound care, arm mobility, and restrictions were reviewed at length with the patient.  The patient feels well, no CP or SOB, he was examined by Dr.Taylor and considered stable for discharge to home.    Physical Exam: Vitals:   04/02/18 1948 04/02/18 2255 04/03/18 0530 04/03/18 1128  BP: (!) 95/52 108/71 126/74 116/64  Pulse: 69 68 70 70  Resp: 18  18 18   Temp: 98 F (36.7 C)  97.9 F (36.6 C) 97.8 F (36.6 C)  TempSrc: Oral  Oral Oral  SpO2: 95%  96% 98%  Weight:   60.6 kg   Height:        GEN- The patient is well appearing, alert and oriented x 3 today.   HEENT: normocephalic, atraumatic; sclera clear, conjunctiva pink; hearing intact; oropharynx clear; neck supple, no JVP Lungs- CTA b/l, normal work of breathing.  No wheezes, rales, rhonchi Heart- RRR, no murmurs, rubs or gallops, PMI not laterally displaced GI- soft, non-tender, non-distended Extremities- no clubbing, cyanosis, or edema MS- no significant deformity or atrophy Skin- warm and dry, no rash or lesion, left chest without hematoma/ecchymosis Psych- euthymic mood, full affect Neuro- no gross deficits   Labs:   Lab Results  Component Value Date   WBC 9.0 03/17/2018   HGB 12.6 (L) 03/17/2018   HCT 41.5 03/17/2018   MCV 99.8 03/17/2018   PLT 218 03/17/2018    Recent Labs  Lab 04/02/18 0933  K 3.2*    Discharge Medications:  Allergies as of 04/03/2018      Reactions   Penicillins Rash   Did it involve swelling of the face/tongue/throat, SOB, or low BP? No Did it involve sudden or severe rash/hives, skin peeling, or any reaction on the inside of your mouth or nose? No Did you need to seek medical attention at a hospital or doctor's office? No When did it last happen?40 years ago If all above answers are "NO", may proceed with cephalosporin use.      Medication List    TAKE these medications     acetaminophen 500 MG tablet Commonly known as:  TYLENOL Take 1,000 mg by mouth daily.   atorvastatin 20 MG tablet Commonly known as:  LIPITOR Take 20 mg by mouth daily at 6 PM.   ELIQUIS 2.5 MG Tabs tablet Generic drug:  apixaban Take 2.5 mg by mouth 2 (two) times daily. Notes to patient:  DO NOT resume until cleared to at your wound check visit   finasteride 5 MG tablet Commonly known as:  PROSCAR Take 5 mg by mouth daily.   KLOR-CON M20 20 MEQ tablet Generic drug:  potassium chloride SA Take 20 mEq by mouth daily.   metoprolol succinate 50 MG 24 hr tablet Commonly known as:  TOPROL-XL Take 25 mg by mouth 2 (two) times daily.   senna 8.6 MG Tabs tablet Commonly known as:  SENOKOT Take 1 tablet by mouth every 3 (three) days.   tamsulosin 0.4 MG Caps capsule Commonly known as:  FLOMAX Take 0.8 mg by mouth daily.   torsemide 20 MG tablet Commonly known as:  DEMADEX Take 20 mg by mouth daily.   traMADol-acetaminophen 37.5-325 MG tablet Commonly known as:  ULTRACET Take 1 tablet by mouth at bedtime. Notes to patient:  Do not use acetaminophen (tylenol) with this medicine, it contains acetaminophen already       Disposition: Home  Discharge Instructions    Diet - low sodium heart healthy   Complete by:  As directed    Increase activity slowly   Complete by:  As directed      Follow-up Information    [facility] Follow up.   Specialty:  Cardiology Why:  04/14/2018 @ 11:00AM, wound check visit Contact information: [address], [address] [place]       Evans Lance, MD Follow up.   Specialty:  Cardiology Why:  07/07/2018 @ 4:30PM Contact information: [address] [address] [phone]           Duration of Discharge Encounter: Greater than 30 minutes including physician time.  Venetia Night, PA-C 04/03/2018 12:03 PM  EP  Attendng  Patient seen and examined. Agree with above. The patient is doing well after his new RV lead placement. He will be discharged home. interrogation of his biv PPM demonstrates normal function. CXR looks good.  He will be discharged home.  [name].D.

## 2018-04-02 NOTE — H&P (Signed)
HPI Mr. Justin [name] was found to have a dislodged and non-functioning RV lead and presents today for RV lead revision. He has not had syncope. His LV lead placed in his atrial port of his PPM is working normally.  Allergies  Allergen Reactions  . Penicillins Rash    Did it involve swelling of the face/tongue/throat, SOB, or low BP? No Did it involve sudden or severe rash/hives, skin peeling, or any reaction on the inside of your mouth or nose? No Did you need to seek medical attention at a hospital or doctor's office? No When did it last happen?40 years ago If all above answers are "NO", may proceed with cephalosporin use.     Current Facility-Administered Medications  Medication Dose Route Frequency Provider Last Rate Last Dose  . 0.9 %  sodium chloride infusion   Intravenous Continuous Evans Lance, MD 50 mL/hr at 04/02/18 [phone]    . chlorhexidine (HIBICLENS) 4 % liquid 4 application  60 mL Topical Once Evans Lance, MD      . gentamicin (GARAMYCIN) 80 mg in sodium chloride 0.9 % 500 mL irrigation  80 mg Irrigation To Cath Evans Lance, MD      . vancomycin (VANCOCIN) IVPB 1000 mg/200 mL premix  1,000 mg Intravenous To Cath Evans Lance, MD 200 mL/hr at 04/02/18 1004 1,000 mg at 04/02/18 1004     Past Medical History:  Diagnosis Date  . Hypertension   . Symptomatic bradycardia     ROS:   All systems reviewed and negative except as noted in the HPI.   Past Surgical History:  Procedure Laterality Date  . BIV PACEMAKER INSERTION CRT-P N/A 03/18/2018   Procedure: BIV PACEMAKER INSERTION CRT-P;  Surgeon: Evans Lance, MD;  Location: [place] [facility];  Service: Cardiovascular;  Laterality: N/A;  . CORONARY ARTERY BYPASS GRAFT       No family history on file.   Social History   Socioeconomic History  . Marital status: Married    Spouse name: Not on file  . Number of children: Not on file  . Years of education: Not on file  . Highest education  level: Not on file  Occupational History  . Not on file  Social Needs  . Financial resource strain: Not on file  . Food insecurity:    Worry: Not on file    Inability: Not on file  . Transportation needs:    Medical: Not on file    Non-medical: Not on file  Tobacco Use  . Smoking status: Former Smoker    Last attempt to quit: 09/20/1966    Years since quitting: 51.5  . Smokeless tobacco: Never Used  Substance and Sexual Activity  . Alcohol use: No  . Drug use: No  . Sexual activity: Not on file  Lifestyle  . Physical activity:    Days per week: Not on file    Minutes per session: Not on file  . Stress: Not on file  Relationships  . Social connections:    Talks on phone: Not on file    Gets together: Not on file    Attends religious service: Not on file    Active member of club or organization: Not on file    Attends meetings of clubs or organizations: Not on file    Relationship status: Not on file  . Intimate partner violence:    Fear of current or ex partner: Not on file  Emotionally abused: Not on file    Physically abused: Not on file    Forced sexual activity: Not on file  Other Topics Concern  . Not on file  Social History Narrative  . Not on file     BP 119/62   Pulse 72   Temp (!) 97.5 F (36.4 C)   Resp 18   Ht 5\' 3"  (1.6 m)   Wt 59 kg   SpO2 100%   BMI 23.03 kg/m   Physical Exam:  Well appearing elderly man,  NAD HEENT: Unremarkable Neck:  No JVD, no thyromegally Lymphatics:  No adenopathy Back:  No CVA tenderness Lungs:  Clear with no wheezes HEART:  Regular rate rhythm, no murmurs, no rubs, no clicks Abd:  soft, positive bowel sounds, no organomegally, no rebound, no guarding Ext:  2 plus pulses, no edema, no cyanosis, no clubbing Skin:  No rashes no nodules Neuro:  CN II through XII intact, motor grossly intact   DEVICE  Normal device function.  See PaceArt for details. RV lead is not capturing  Assess/Plan: 1. RV lead  dislodgement - I have discussed the treatment options with the patient and his wishes to proceed. 2. CHB - he has no escape rhythm. 3. Atrial fib - this is chronic.  [name].D.

## 2018-04-02 NOTE — Discharge Instructions (Signed)
° ° °  Supplemental Discharge Instructions for  Pacemaker/Defibrillator Patients  Activity No heavy lifting or vigorous activity with your left/right arm for 6 to 8 weeks.  Do not raise your left/right arm above your head for one week.  Gradually raise your affected arm as drawn below.              04/06/2018                04/08/2018                04/09/2018              04/10/2018 __  NO DRIVING for 1 week  ; you may begin driving on   06/19/3265  .  WOUND CARE - Keep the wound area clean and dry.  Do not get this area wet for one week. No showers for one week; you may shower on 04/02/2018 . - The tape/steri-strips on your wound will fall off; do not pull them off.  No bandage is needed on the site.  DO  NOT apply any creams, oils, or ointments to the wound area. - If you notice any drainage or discharge from the wound, any swelling or bruising at the site, or you develop a fever > 101? F after you are discharged home, call the office at once.  Special Instructions - You are still able to use cellular telephones; use the ear opposite the side where you have your pacemaker/defibrillator.  Avoid carrying your cellular phone near your device. - When traveling through airports, show security personnel your identification card to avoid being screened in the metal detectors.  Ask the security personnel to use the hand wand. - Avoid arc welding equipment, MRI testing (magnetic resonance imaging), TENS units (transcutaneous nerve stimulators).  Call the office for questions about other devices. - Avoid electrical appliances that are in poor condition or are not properly grounded. - Microwave ovens are safe to be near or to operate.

## 2018-04-03 ENCOUNTER — Ambulatory Visit (HOSPITAL_COMMUNITY): Payer: Medicare Other

## 2018-04-03 ENCOUNTER — Encounter: Payer: Medicare Other | Admitting: Internal Medicine

## 2018-04-03 ENCOUNTER — Encounter (HOSPITAL_COMMUNITY): Payer: Self-pay | Admitting: Internal Medicine

## 2018-04-03 DIAGNOSIS — Z880 Allergy status to penicillin: Secondary | ICD-10-CM | POA: Diagnosis not present

## 2018-04-03 DIAGNOSIS — Z950 Presence of cardiac pacemaker: Secondary | ICD-10-CM | POA: Diagnosis not present

## 2018-04-03 DIAGNOSIS — Z87891 Personal history of nicotine dependence: Secondary | ICD-10-CM | POA: Diagnosis not present

## 2018-04-03 DIAGNOSIS — I442 Atrioventricular block, complete: Secondary | ICD-10-CM

## 2018-04-03 DIAGNOSIS — R001 Bradycardia, unspecified: Secondary | ICD-10-CM | POA: Diagnosis not present

## 2018-04-03 DIAGNOSIS — Z951 Presence of aortocoronary bypass graft: Secondary | ICD-10-CM | POA: Diagnosis not present

## 2018-04-03 DIAGNOSIS — T82120A Displacement of cardiac electrode, initial encounter: Secondary | ICD-10-CM | POA: Diagnosis not present

## 2018-04-03 DIAGNOSIS — I4821 Permanent atrial fibrillation: Secondary | ICD-10-CM | POA: Diagnosis not present

## 2018-04-03 DIAGNOSIS — I2510 Atherosclerotic heart disease of native coronary artery without angina pectoris: Secondary | ICD-10-CM | POA: Diagnosis not present

## 2018-04-03 DIAGNOSIS — I10 Essential (primary) hypertension: Secondary | ICD-10-CM | POA: Diagnosis not present

## 2018-04-03 MED ORDER — MUPIROCIN 2 % EX OINT
1.0000 "application " | TOPICAL_OINTMENT | Freq: Two times a day (BID) | CUTANEOUS | Status: DC
  Administered 2018-04-03: 1 via NASAL

## 2018-04-03 MED ORDER — CHLORHEXIDINE GLUCONATE CLOTH 2 % EX PADS
6.0000 | MEDICATED_PAD | Freq: Every day | CUTANEOUS | Status: DC
  Administered 2018-04-03: 6 via TOPICAL

## 2018-04-03 MED FILL — Lidocaine HCl Local Inj 1%: INTRAMUSCULAR | Qty: 60 | Status: AC

## 2018-04-14 ENCOUNTER — Ambulatory Visit (INDEPENDENT_AMBULATORY_CARE_PROVIDER_SITE_OTHER): Payer: Medicare Other | Admitting: Nurse Practitioner

## 2018-04-14 DIAGNOSIS — R001 Bradycardia, unspecified: Secondary | ICD-10-CM

## 2018-04-14 LAB — CUP PACEART INCLINIC DEVICE CHECK
Date Time Interrogation Session: 20200303115132
Implantable Lead Implant Date: 20200205
Implantable Lead Implant Date: 20200205
Implantable Lead Location: 753858
Implantable Lead Location: 753860
MDC IDC PG IMPLANT DT: 20200205
Pulse Gen Model: 2272
Pulse Gen Serial Number: 9103311

## 2018-04-14 NOTE — Progress Notes (Signed)
Wound check appointment. Steri-strips removed. +small soft hematoma. 2 stitch abcesses, both removed by Dr Lovena Le today. Normal device function. Sensing and impedances consistent with implant measurements. Pt is device dependent per notes, unable to run threshold today 2/2 RF timeout and delay in device interrogation time. Rate response turned on today. Device programmed at 3.5V/auto capture programmed on for extra safety margin until 3 month visit. Histogram distribution appropriate for patient and level of activity. No mode switches or high ventricular rates noted. ROV 1 week for wound recheck with Dr Lovena Le. Hold Eliquis until that appt per Dr Lovena Le. Pt also c/o LE edema, per Dr Lovena Le, take an extra Torsemide for the next 3 days.

## 2018-04-15 ENCOUNTER — Other Ambulatory Visit: Payer: Self-pay | Admitting: Physician Assistant

## 2018-04-15 DIAGNOSIS — N138 Other obstructive and reflux uropathy: Secondary | ICD-10-CM

## 2018-04-15 DIAGNOSIS — N401 Enlarged prostate with lower urinary tract symptoms: Secondary | ICD-10-CM

## 2018-04-15 NOTE — Telephone Encounter (Signed)
SG staff    Patient would need a follow-up for [place] to continue prescribing this medication.  His last appointment was 2017.  If he would like to continue taking this medication he should either schedule a follow-up appointment or discuss with his PCP about further refills.    Thanks

## 2018-04-15 NOTE — Telephone Encounter (Signed)
Left VM informing that pt needs a follow up appt before we can refill medication or to try to ask PCP.

## 2018-04-21 ENCOUNTER — Other Ambulatory Visit: Payer: Self-pay | Admitting: Physician Assistant

## 2018-04-21 DIAGNOSIS — N401 Enlarged prostate with lower urinary tract symptoms: Secondary | ICD-10-CM

## 2018-04-21 DIAGNOSIS — N138 Other obstructive and reflux uropathy: Secondary | ICD-10-CM

## 2018-04-21 MED ORDER — FINASTERIDE 5 MG PO TABS *I*
5.0000 mg | ORAL_TABLET | Freq: Every day | ORAL | 0 refills | Status: AC
Start: 2018-04-21 — End: ?

## 2018-04-21 NOTE — Telephone Encounter (Signed)
SG staff    Please encourage the patient to make a follow-up appointment.  Further refills cannot be provided unless he follows up in the office.    Thanks

## 2018-04-24 ENCOUNTER — Ambulatory Visit (INDEPENDENT_AMBULATORY_CARE_PROVIDER_SITE_OTHER): Payer: Medicare Other | Admitting: Internal Medicine

## 2018-04-24 ENCOUNTER — Encounter: Payer: Self-pay | Admitting: Internal Medicine

## 2018-04-24 ENCOUNTER — Other Ambulatory Visit: Payer: Self-pay

## 2018-04-24 VITALS — BP 104/68 | HR 75 | Ht 63.0 in | Wt 134.2 lb

## 2018-04-24 DIAGNOSIS — I442 Atrioventricular block, complete: Secondary | ICD-10-CM

## 2018-04-24 DIAGNOSIS — Z950 Presence of cardiac pacemaker: Secondary | ICD-10-CM

## 2018-04-24 LAB — CUP PACEART INCLINIC DEVICE CHECK
Battery Remaining Longevity: 49 mo
Battery Voltage: 3.01 V
Brady Statistic RA Percent Paced: 59 %
Brady Statistic RV Percent Paced: 79 %
Date Time Interrogation Session: 20200313133151
Implantable Lead Implant Date: 20200205
Implantable Lead Implant Date: 20200205
Implantable Lead Location: 753858
Implantable Pulse Generator Implant Date: 20200205
Lead Channel Impedance Value: 575 Ohm
Lead Channel Impedance Value: 750 Ohm
Lead Channel Pacing Threshold Pulse Width: 1 ms
Lead Channel Setting Pacing Amplitude: 3.5 V
Lead Channel Setting Pacing Amplitude: 3.5 V
Lead Channel Setting Pacing Pulse Width: 0.5 ms
Lead Channel Setting Sensing Sensitivity: 4 mV
MDC IDC LEAD LOCATION: 753860
MDC IDC MSMT LEADCHNL RA PACING THRESHOLD AMPLITUDE: 2.25 V
Pulse Gen Model: 2272
Pulse Gen Serial Number: 9103311

## 2018-04-24 NOTE — Patient Instructions (Signed)
Medication Instructions:  Your physician recommends that you continue on your current medications as directed. Please refer to the Current Medication list given to you today.  Labwork: None ordered.  Testing/Procedures: None ordered.  Follow-Up:  Follow up as scheduled for Jul 07, 2018 at 4:30 pm  Any Other Special Instructions Will Be Listed Below (If Applicable).  If you need a refill on your cardiac medications before your next appointment, please call your pharmacy.

## 2018-04-24 NOTE — Progress Notes (Signed)
HPI Mr. Justin [name] returns today for followup of his CHB, chronic atrial fib, mild LV dysfunction, s/p DDD PM insertion with the LV lead placed in the atrial port. The patient developed RV lead dislodgement after his initial implant and had his lead repositioned. He then had a small hematoma. His [place] has been held. He has been stable in the interim except for problems with peripheral edema. He admits to some dietary indiscretion. No anginal symptoms.  Allergies  Allergen Reactions  . Penicillins Rash    Did it involve swelling of the face/tongue/throat, SOB, or low BP? No Did it involve sudden or severe rash/hives, skin peeling, or any reaction on the inside of your mouth or nose? No Did you need to seek medical attention at a hospital or doctor's office? No When did it last happen?40 years ago If all above answers are "NO", may proceed with cephalosporin use.     Current Outpatient Medications  Medication Sig Dispense Refill  . acetaminophen (TYLENOL) 500 MG tablet Take 1,000 mg by mouth daily.    Marland Kitchen atorvastatin (LIPITOR) 20 MG tablet Take 20 mg by mouth daily at 6 PM.     . ELIQUIS 2.5 MG TABS tablet Take 2.5 mg by mouth 2 (two) times daily.     . finasteride (PROSCAR) 5 MG tablet Take 5 mg by mouth daily.     Marland Kitchen KLOR-CON M20 20 MEQ tablet Take 20 mEq by mouth daily.     . metoprolol succinate (TOPROL-XL) 50 MG 24 hr tablet Take 25 mg by mouth 2 (two) times daily.    Marland Kitchen senna (SENOKOT) 8.6 MG TABS tablet Take 1 tablet by mouth every 3 (three) days.    . tamsulosin (FLOMAX) 0.4 MG CAPS capsule Take 0.8 mg by mouth daily.     Marland Kitchen torsemide (DEMADEX) 20 MG tablet Take 20 mg by mouth daily.     . traMADol-acetaminophen (ULTRACET) 37.5-325 MG tablet Take 1 tablet by mouth at bedtime.      No current facility-administered medications for this visit.      Past Medical History:  Diagnosis Date  . Hypertension   . Symptomatic bradycardia     ROS:   All systems reviewed and  negative except as noted in the HPI.   Past Surgical History:  Procedure Laterality Date  . BIV PACEMAKER INSERTION CRT-P N/A 03/18/2018   Procedure: BIV PACEMAKER INSERTION CRT-P;  Surgeon: Evans Lance, MD;  Location: [facility] [place] [facility];  Service: Cardiovascular;  Laterality: N/A;  . CORONARY ARTERY BYPASS GRAFT    . LEAD REVISION/REPAIR N/A 04/02/2018   Procedure: LEAD REVISION/REPAIR;  Surgeon: Evans Lance, MD;  Location: [facility] [facility];  Service: Cardiovascular;  Laterality: N/A;     History reviewed. No pertinent family history.   Social History   Socioeconomic History  . Marital status: Married    Spouse name: Not on file  . Number of children: Not on file  . Years of education: Not on file  . Highest education level: Not on file  Occupational History  . Not on file  Social Needs  . Financial resource strain: Not on file  . Food insecurity:    Worry: Not on file    Inability: Not on file  . Transportation needs:    Medical: Not on file    Non-medical: Not on file  Tobacco Use  . Smoking status: Former Smoker    Last attempt to quit: 09/20/1966  Years since quitting: 51.6  . Smokeless tobacco: Never Used  Substance and Sexual Activity  . Alcohol use: No  . Drug use: No  . Sexual activity: Not on file  Lifestyle  . Physical activity:    Days per week: Not on file    Minutes per session: Not on file  . Stress: Not on file  Relationships  . Social connections:    Talks on phone: Not on file    Gets together: Not on file    Attends religious service: Not on file    Active member of club or organization: Not on file    Attends meetings of clubs or organizations: Not on file    Relationship status: Not on file  . Intimate partner violence:    Fear of current or ex partner: Not on file    Emotionally abused: Not on file    Physically abused: Not on file    Forced sexual activity: Not on file  Other Topics Concern  . Not on file  Social History  Narrative  . Not on file     BP 104/68   Pulse 75   Ht 5\' 3"  (1.6 m)   Wt 134 lb 3.2 oz (60.9 kg)   SpO2 98%   BMI 23.77 kg/m   Physical Exam:  Well appearing NAD HEENT: Unremarkable Neck:  No JVD, no thyromegally Lymphatics:  No adenopathy Back:  No CVA tenderness Lungs:  Clear with no wheezes HEART:  Regular rate rhythm, no murmurs, no rubs, no clicks Abd:  soft, positive bowel sounds, no organomegally, no rebound, no guarding Ext:  2 plus pulses, 2+ edema, no cyanosis, no clubbing Skin:  No rashes no nodules Neuro:  CN II through XII intact, motor grossly intact  EKG - none  DEVICE  Normal device function.  See PaceArt for details.   Assess/Plan: 1. Chronic diastolic heart failure- he has more in the way of peripheral edema. I have recommended he reduce his salt intake, and keep his legs elevated.  2. Pocket hematoma - this is much improved and I have asked him to restart his eliquis. 3. Atrial fib - his rate is well controlled. We will follow. 4. PPM - his DDD [employer] PPM with the LV lead in the atrial port is working normally.   [name].D.

## 2018-04-26 ENCOUNTER — Encounter: Payer: Self-pay | Admitting: Internal Medicine

## 2018-04-28 ENCOUNTER — Telehealth: Payer: Self-pay | Admitting: Internal Medicine

## 2018-04-28 DIAGNOSIS — S51012A Laceration without foreign body of left elbow, initial encounter: Secondary | ICD-10-CM | POA: Diagnosis not present

## 2018-04-28 DIAGNOSIS — H5316 Psychophysical visual disturbances: Secondary | ICD-10-CM | POA: Diagnosis not present

## 2018-04-28 NOTE — Telephone Encounter (Signed)
Returned call to Pt's daughter.  Per daughter Pt has been having hallucinations since pacemaker revision (3 weeks ago).  Pt fell this am and medics came but his VS were normal so they declined to take Pt to hospital (with agreement of daughter).  Advised daughter to call his PCP to determine if he needs to be seen.  Daughter indicates understanding.

## 2018-04-28 NOTE — Telephone Encounter (Signed)
New Message   Patient's daughter states he has been having hallucinations and he fell today and the medics are on their way to take him to the hospital she's not sure what wrong but she wanted the doctor to know.  It could be his pacemaker or the medication she states.

## 2018-04-29 ENCOUNTER — Emergency Department (HOSPITAL_COMMUNITY): Payer: Medicare Other

## 2018-04-29 ENCOUNTER — Encounter (HOSPITAL_COMMUNITY): Payer: Self-pay | Admitting: Emergency Medicine

## 2018-04-29 ENCOUNTER — Other Ambulatory Visit: Payer: Self-pay

## 2018-04-29 ENCOUNTER — Emergency Department (HOSPITAL_COMMUNITY)
Admission: EM | Admit: 2018-04-29 | Discharge: 2018-04-29 | Disposition: A | Discharge status: Home / Self Care | Payer: Medicare Other | Source: Home / Self Care | Attending: Emergency Medicine | Admitting: Emergency Medicine

## 2018-04-29 DIAGNOSIS — R9082 White matter disease, unspecified: Secondary | ICD-10-CM | POA: Diagnosis not present

## 2018-04-29 DIAGNOSIS — W19XXXA Unspecified fall, initial encounter: Secondary | ICD-10-CM | POA: Diagnosis not present

## 2018-04-29 DIAGNOSIS — I5033 Acute on chronic diastolic (congestive) heart failure: Secondary | ICD-10-CM | POA: Diagnosis not present

## 2018-04-29 DIAGNOSIS — R5381 Other malaise: Secondary | ICD-10-CM | POA: Insufficient documentation

## 2018-04-29 DIAGNOSIS — Z950 Presence of cardiac pacemaker: Secondary | ICD-10-CM

## 2018-04-29 DIAGNOSIS — J810 Acute pulmonary edema: Secondary | ICD-10-CM | POA: Diagnosis not present

## 2018-04-29 DIAGNOSIS — Z87891 Personal history of nicotine dependence: Secondary | ICD-10-CM | POA: Insufficient documentation

## 2018-04-29 DIAGNOSIS — S199XXA Unspecified injury of neck, initial encounter: Secondary | ICD-10-CM | POA: Diagnosis not present

## 2018-04-29 DIAGNOSIS — N390 Urinary tract infection, site not specified: Secondary | ICD-10-CM | POA: Diagnosis not present

## 2018-04-29 DIAGNOSIS — R6520 Severe sepsis without septic shock: Secondary | ICD-10-CM | POA: Diagnosis not present

## 2018-04-29 DIAGNOSIS — Z79899 Other long term (current) drug therapy: Secondary | ICD-10-CM

## 2018-04-29 DIAGNOSIS — J9601 Acute respiratory failure with hypoxia: Secondary | ICD-10-CM | POA: Diagnosis not present

## 2018-04-29 DIAGNOSIS — Z66 Do not resuscitate: Secondary | ICD-10-CM | POA: Diagnosis not present

## 2018-04-29 DIAGNOSIS — R4182 Altered mental status, unspecified: Secondary | ICD-10-CM | POA: Diagnosis not present

## 2018-04-29 DIAGNOSIS — A419 Sepsis, unspecified organism: Secondary | ICD-10-CM | POA: Diagnosis not present

## 2018-04-29 DIAGNOSIS — R319 Hematuria, unspecified: Secondary | ICD-10-CM | POA: Diagnosis not present

## 2018-04-29 DIAGNOSIS — R0602 Shortness of breath: Secondary | ICD-10-CM | POA: Diagnosis not present

## 2018-04-29 DIAGNOSIS — N179 Acute kidney failure, unspecified: Secondary | ICD-10-CM | POA: Diagnosis not present

## 2018-04-29 DIAGNOSIS — J90 Pleural effusion, not elsewhere classified: Secondary | ICD-10-CM | POA: Diagnosis not present

## 2018-04-29 DIAGNOSIS — Z515 Encounter for palliative care: Secondary | ICD-10-CM | POA: Diagnosis not present

## 2018-04-29 DIAGNOSIS — I10 Essential (primary) hypertension: Secondary | ICD-10-CM

## 2018-04-29 DIAGNOSIS — S0990XA Unspecified injury of head, initial encounter: Secondary | ICD-10-CM | POA: Diagnosis not present

## 2018-04-29 DIAGNOSIS — R0902 Hypoxemia: Secondary | ICD-10-CM | POA: Diagnosis not present

## 2018-04-29 LAB — CBC WITH DIFFERENTIAL/PLATELET
Abs Immature Granulocytes: 0.04 10*3/uL (ref 0.00–0.07)
Basophils Absolute: 0 10*3/uL (ref 0.0–0.1)
Basophils Relative: 1 %
Eosinophils Absolute: 0 10*3/uL (ref 0.0–0.5)
Eosinophils Relative: 1 %
HEMATOCRIT: 37.3 % — AB (ref 39.0–52.0)
Hemoglobin: 11.9 g/dL — ABNORMAL LOW (ref 13.0–17.0)
Immature Granulocytes: 1 %
Lymphocytes Relative: 9 %
Lymphs Abs: 0.7 10*3/uL (ref 0.7–4.0)
MCH: 31.6 pg (ref 26.0–34.0)
MCHC: 31.9 g/dL (ref 30.0–36.0)
MCV: 99.2 fL (ref 80.0–100.0)
MONO ABS: 0.7 10*3/uL (ref 0.1–1.0)
Monocytes Relative: 8 %
Neutro Abs: 6.3 10*3/uL (ref 1.7–7.7)
Neutrophils Relative %: 80 %
Platelets: 214 10*3/uL (ref 150–400)
RBC: 3.76 MIL/uL — ABNORMAL LOW (ref 4.22–5.81)
RDW: 16.1 % — ABNORMAL HIGH (ref 11.5–15.5)
WBC: 7.8 10*3/uL (ref 4.0–10.5)
nRBC: 0 % (ref 0.0–0.2)

## 2018-04-29 LAB — POCT I-STAT EG7
Acid-base deficit: 4 mmol/L — ABNORMAL HIGH (ref 0.0–2.0)
Bicarbonate: 20.6 mmol/L (ref 20.0–28.0)
Calcium, Ion: 1.23 mmol/L (ref 1.15–1.40)
HCT: 36 % — ABNORMAL LOW (ref 39.0–52.0)
Hemoglobin: 12.2 g/dL — ABNORMAL LOW (ref 13.0–17.0)
O2 Saturation: 65 %
PCO2 VEN: 37 mmHg — AB (ref 44.0–60.0)
Potassium: 4.7 mmol/L (ref 3.5–5.1)
Sodium: 140 mmol/L (ref 135–145)
TCO2: 22 mmol/L (ref 22–32)
pH, Ven: 7.354 (ref 7.250–7.430)
pO2, Ven: 35 mmHg (ref 32.0–45.0)

## 2018-04-29 LAB — BASIC METABOLIC PANEL
Anion gap: 10 (ref 5–15)
BUN: 34 mg/dL — AB (ref 8–23)
CALCIUM: 9.1 mg/dL (ref 8.9–10.3)
CO2: 19 mmol/L — ABNORMAL LOW (ref 22–32)
CREATININE: 1.58 mg/dL — AB (ref 0.61–1.24)
Chloride: 111 mmol/L (ref 98–111)
GFR calc Af Amer: 40 mL/min — ABNORMAL LOW (ref >60)
GFR calc non Af Amer: 35 mL/min — ABNORMAL LOW (ref >60)
Glucose, Bld: 109 mg/dL — ABNORMAL HIGH (ref 70–99)
Potassium: 4.7 mmol/L (ref 3.5–5.1)
Sodium: 140 mmol/L (ref 135–145)

## 2018-04-29 MED ORDER — SODIUM CHLORIDE 0.9 % IV BOLUS
1000.0000 mL | Freq: Once | INTRAVENOUS | Status: AC
  Administered 2018-04-29: 1000 mL via INTRAVENOUS

## 2018-04-29 NOTE — ED Provider Notes (Signed)
[facility] EMERGENCY DEPARTMENT Provider Note   CSN: [id number] Arrival date & time: 04/29/18  0932    History   Chief Complaint Chief Complaint  Patient presents with   Fall   Shortness of Breath    HPI Justin [name] is a 85 y.o. male.     HPI  He has been troubled by hallucinations for 5 weeks since the pacemaker was placed.  He had to have a repeat procedure to replace the lead.  In the last few days he has been having trouble sleeping because his hallucinations are worsening.  His hallucinating is characterized by him visualizing prior events and talking about him.  He lives with his daughter who feels that he is having increased difficulty walking, because of hallucinations and sometimes he falls.  He has injured his lower back, and this morning he fell and hit his head.  Yesterday he saw his PCP for checkup regarding these issues and was told to maintain status call with medications and treatments.  Expectant management was planned at that time.  Patient presents by EMS and was found to be hypoxic, which was treated with nasal cannula oxygen with improvement.  He has had occasional cough but not been complaining of shortness of breath.  The patient is unable to contribute to history.  His daughter gives all the history.  There have been no other illnesses by her report.  There are no other known modifying factors.   Past Medical History:  Diagnosis Date   Hypertension    Symptomatic bradycardia     Patient Active Problem List   Diagnosis Date Noted   Complete heart block ([place]) 04/02/2018   Atrial pacemaker lead displacement 04/02/2018   Symptomatic bradycardia 03/17/2018    Past Surgical History:  Procedure Laterality Date   BIV PACEMAKER INSERTION CRT-P N/A 03/18/2018   Procedure: BIV PACEMAKER INSERTION CRT-P;  Surgeon: Evans Lance, MD;  Location: [place] [facility];  Service: Cardiovascular;  Laterality: N/A;   CORONARY ARTERY BYPASS  GRAFT     LEAD REVISION/REPAIR N/A 04/02/2018   Procedure: LEAD REVISION/REPAIR;  Surgeon: Evans Lance, MD;  Location: [place] [facility];  Service: Cardiovascular;  Laterality: N/A;        Home Medications    Prior to Admission medications   Medication Sig Start Date End Date Taking? Authorizing Provider  acetaminophen (TYLENOL) 500 MG tablet Take 1,000 mg by mouth daily.   Yes [provider]  atorvastatin (LIPITOR) 20 MG tablet Take 20 mg by mouth daily at 6 PM.  08/11/16  Yes [provider]  ELIQUIS 2.5 MG TABS tablet Take 2.5 mg by mouth 2 (two) times daily.  09/08/16  Yes [provider]  finasteride (PROSCAR) 5 MG tablet Take 5 mg by mouth daily.  07/09/16  Yes [provider]  KLOR-CON M20 20 MEQ tablet Take 20 mEq by mouth daily.  09/15/16  Yes [provider]  metoprolol succinate (TOPROL-XL) 50 MG 24 hr tablet Take 25 mg by mouth 2 (two) times daily. 02/02/18  Yes [provider]  senna (SENOKOT) 8.6 MG TABS tablet Take 1 tablet by mouth every 3 (three) days.   Yes [provider]  tamsulosin (FLOMAX) 0.4 MG CAPS capsule Take 0.8 mg by mouth daily.  07/28/16  Yes [provider]  torsemide (DEMADEX) 20 MG tablet Take 20 mg by mouth daily.  08/11/16  Yes [provider]  traMADol-acetaminophen (ULTRACET) 37.5-325 MG tablet Take  1 tablet by mouth at bedtime.  02/08/18  Yes [provider]    Family History No family history on file.  Social History Social History   Tobacco Use   Smoking status: Former Smoker    Last attempt to quit: 09/20/1966    Years since quitting: 51.6   Smokeless tobacco: Never Used  Substance Use Topics   Alcohol use: No   Drug use: No     Allergies   Penicillins   Review of Systems Review of Systems  All other systems reviewed and are negative.    Physical Exam Updated Vital Signs BP 116/82    Pulse 87    Temp 98.2 F (36.8 C) (Oral)    Resp  (!) 22    Ht 5\' 3"  (1.6 m)    Wt 60 kg    SpO2 94%    BMI 23.43 kg/m   Physical Exam Vitals signs and nursing note reviewed.  Constitutional:      General: He is not in acute distress.    Appearance: He is not ill-appearing, toxic-appearing or diaphoretic.     Comments: Elderly, frail  HENT:     Head: Normocephalic and atraumatic.     Right Ear: External ear normal.     Left Ear: External ear normal.  Eyes:     Conjunctiva/sclera: Conjunctivae normal.     Pupils: Pupils are equal, round, and reactive to light.  Neck:     Musculoskeletal: Normal range of motion and neck supple.     Trachea: Phonation normal.  Cardiovascular:     Rate and Rhythm: Normal rate and regular rhythm.     Heart sounds: Normal heart sounds.  Pulmonary:     Effort: Pulmonary effort is normal. No respiratory distress.     Breath sounds: Normal breath sounds. No stridor. No wheezing, rhonchi or rales.  Chest:     Chest wall: No tenderness.  Abdominal:     Palpations: Abdomen is soft.     Tenderness: There is no abdominal tenderness.  Musculoskeletal: Normal range of motion.        General: No deformity.     Comments: Right third finger is ecchymotic but has normal range of motion there is no deformity.  There is associated abrasion at the site as well that is not bleeding currently.  Healing abrasion, left elbow region,  Skin:    General: Skin is warm and dry.     Comments: Scattered excoriations and bruising.  Left earlobe has a chronic appearing wound, likely basal cell carcinoma.  Bruising right posterior lower thorax consistent with chest wall injury.  No associated crepitation at the site.  Neurological:     Mental Status: He is alert and oriented to person, place, and time.     Cranial Nerves: No cranial nerve deficit.     Sensory: No sensory deficit.     Motor: No abnormal muscle tone.     Coordination: Coordination normal.  Psychiatric:        Mood and Affect: Mood normal.        Behavior:  Behavior normal.      ED Treatments / Results  Labs (all labs ordered are listed, but only abnormal results are displayed) Labs Reviewed  BASIC METABOLIC PANEL - Abnormal; Notable for the following components:      Result Value   CO2 19 (*)    Glucose, Bld 109 (*)    BUN 34 (*)    Creatinine, Ser 1.58 (*)  GFR calc non Af Amer 35 (*)    GFR calc Af Amer 40 (*)    All other components within normal limits  CBC WITH DIFFERENTIAL/PLATELET - Abnormal; Notable for the following components:   RBC 3.76 (*)    Hemoglobin 11.9 (*)    HCT 37.3 (*)    RDW 16.1 (*)    All other components within normal limits  POCT I-STAT EG7 - Abnormal; Notable for the following components:   pCO2, Ven 37.0 (*)    Acid-base deficit 4.0 (*)    HCT 36.0 (*)    Hemoglobin 12.2 (*)    All other components within normal limits  BLOOD GAS, VENOUS    EKG None  Radiology Dg Chest 2 View  Result Date: 04/29/2018 CLINICAL DATA:  Shortness of breath. EXAM: CHEST - 2 VIEW COMPARISON:  04/03/2018 FINDINGS: Chronic cardiopericardial enlargement. There is a biventricular pacer from the left with stable appearance. Trace pleural effusions that were also seen previously. Hyperinflation with diaphragm flattening. Vague hazy density at the left apex is bony in stable over multiple years. IMPRESSION: No acute finding when compared to priors. Electronically Signed   By: Monte Fantasia M.D.   On: 04/29/2018 09:01   Ct Head Wo Contrast  Result Date: 04/29/2018 CLINICAL DATA:  Hallucinating, AMS, fall EXAM: CT HEAD WITHOUT CONTRAST CT CERVICAL SPINE WITHOUT CONTRAST TECHNIQUE: Multidetector CT imaging of the head and cervical spine was performed following the standard protocol without intravenous contrast. Multiplanar CT image reconstructions of the cervical spine were also generated. COMPARISON:  None. FINDINGS: CT HEAD FINDINGS Brain: No evidence of acute infarction, hemorrhage, hydrocephalus, extra-axial collection or  mass lesion/mass effect. Extensive periventricular and deep white matter hypodensity. Mild global volume loss. Vascular: No hyperdense vessel or unexpected calcification. Skull: Normal. Negative for fracture or focal lesion. Sinuses/Orbits: No acute finding. Other: None. CT CERVICAL SPINE FINDINGS Alignment: Degenerative straightening of the normal cervical lordosis. Skull base and vertebrae: No acute fracture. No primary bone lesion or focal pathologic process. Soft tissues and spinal canal: No prevertebral fluid or swelling. No visible canal hematoma. Disc levels: Moderate to severe multilevel disc degenerative disease and osteophytosis. Upper chest: Negative. Other: None. IMPRESSION: 1. No acute intracranial pathology. Small-vessel white matter disease and mild global volume loss in keeping with advanced patient age. 2. No fracture or static subluxation of the cervical spine. Moderate to severe multilevel disc degenerative disease. Electronically Signed   By: Eddie Candle M.D.   On: 04/29/2018 09:28   Ct Cervical Spine Wo Contrast  Result Date: 04/29/2018 CLINICAL DATA:  Hallucinating, AMS, fall EXAM: CT HEAD WITHOUT CONTRAST CT CERVICAL SPINE WITHOUT CONTRAST TECHNIQUE: Multidetector CT imaging of the head and cervical spine was performed following the standard protocol without intravenous contrast. Multiplanar CT image reconstructions of the cervical spine were also generated. COMPARISON:  None. FINDINGS: CT HEAD FINDINGS Brain: No evidence of acute infarction, hemorrhage, hydrocephalus, extra-axial collection or mass lesion/mass effect. Extensive periventricular and deep white matter hypodensity. Mild global volume loss. Vascular: No hyperdense vessel or unexpected calcification. Skull: Normal. Negative for fracture or focal lesion. Sinuses/Orbits: No acute finding. Other: None. CT CERVICAL SPINE FINDINGS Alignment: Degenerative straightening of the normal cervical lordosis. Skull base and vertebrae: No  acute fracture. No primary bone lesion or focal pathologic process. Soft tissues and spinal canal: No prevertebral fluid or swelling. No visible canal hematoma. Disc levels: Moderate to severe multilevel disc degenerative disease and osteophytosis. Upper chest: Negative. Other: None. IMPRESSION: 1. No acute  intracranial pathology. Small-vessel white matter disease and mild global volume loss in keeping with advanced patient age. 2. No fracture or static subluxation of the cervical spine. Moderate to severe multilevel disc degenerative disease. Electronically Signed   By: Eddie Candle M.D.   On: 04/29/2018 09:28    Procedures Procedures (including critical care time)  Medications Ordered in ED Medications  sodium chloride 0.9 % bolus 1,000 mL (1,000 mLs Intravenous New Bag/Given 04/29/18 0916)     Initial Impression / Assessment and Plan / ED Course  I have reviewed the triage vital signs and the nursing notes.  Pertinent labs & imaging results that were available during my care of the patient were reviewed by me and considered in my medical decision making (see chart for details).  Clinical Course as of Apr 29 1015  Wed Apr 29, 2018  1011 Normal axis at PCO2 low, venous gas.  Also hematocrit slightly low.  POCT I-Stat EG7(!) [EW]  1011 Normal except CO2 low, glucose high, BUN high, creatinine high, GFR low  Basic metabolic panel(!) [EW]  2440 Normal except hemoglobin low  CBC with Differential(!) [EW]  1012 No intracranial injury, images reviewed by me  CT Head Wo Contrast [EW]  1012 Degenerative changes without fracture or dislocation, images reviewed by me  CT Cervical Spine Wo Contrast [EW]  1012 Infiltrate or CHF, images reviewed by me   [EW]    Clinical Course User Index [EW] Daleen Bo, MD        Patient Vitals for the past 24 hrs:  BP Temp Temp src Pulse Resp SpO2 Height Weight  04/29/18 0900 116/82 -- -- 87 (!) 22 94 % -- --  04/29/18 0845 105/66 -- -- 83 19 90 %  -- --  04/29/18 0830 115/80 -- -- 81 18 100 % -- --  04/29/18 0815 124/84 -- -- 84 19 99 % -- --  04/29/18 0800 101/63 -- -- 88 18 96 % -- --  04/29/18 0756 97/73 98.2 F (36.8 C) Oral 87 18 100 % -- --  04/29/18 0755 -- -- -- -- -- -- 5\' 3"  (1.6 m) 60 kg  04/29/18 0749 -- -- -- -- -- 90 % -- --    10:13 AM Reevaluation with update and discussion. After initial assessment and treatment, an updated evaluation reveals no change in clinical status, findings discussed with patient, and daughter, all questions answered. Daleen Bo   Medical Decision Making: Specific symptoms an elderly male, without serious injuries on evaluation.  No evidence for acute bacterial infection, metabolic instability or suggestion for impending vascular collapse.  Doubt acute unstable psychiatric condition.  Suspect dementia as primary cause for hallucinations.  No indication for hospitalization at this time.  He may require additional help in the home setting versus placement if additional care is needed.  Patient has access to follow-up care and treatment by his PCP.  CRITICAL CARE-no Performed by: Daleen Bo  Nursing Notes Reviewed/ Care Coordinated Applicable Imaging Reviewed Interpretation of Laboratory Data incorporated into ED treatment  The patient appears reasonably screened and/or stabilized for discharge and I doubt any other medical condition or other [facility] requiring further screening, evaluation, or treatment in the ED at this time prior to discharge.  Plan: Home Medications-continue usual; Home Treatments-rest,fluids; return here if the recommended treatment, does not improve the symptoms; Recommended follow up-PCP, PRN   Final Clinical Impressions(s) / ED Diagnoses   Final diagnoses:  Fall, initial encounter  [facility]    ED Discharge  Orders    None       Daleen Bo, MD 04/29/18 1018

## 2018-04-29 NOTE — ED Notes (Signed)
Patient transported to CT from x-ray 

## 2018-04-29 NOTE — ED Notes (Signed)
Patient verbalizes understanding of discharge instructions. Opportunity for questioning and answers were provided. Armband removed by staff, pt discharged from ED.  

## 2018-04-29 NOTE — Discharge Instructions (Addendum)
The testing today is reassuring.  There are no signs of serious injuries from his falls.  There is no sign of a brain problem that might be contributing to his hallucinations.  If these continue to be a problem, asked Dr. Tamala Julian about referral to a neurologist.  Also if you need additional help with things at home, like a hospital bed, Dr. Tamala Julian can help you with that.

## 2018-04-29 NOTE — ED Notes (Signed)
Patient transported to X-ray 

## 2018-04-29 NOTE — ED Notes (Signed)
Lab results was given to Dr. Eulis Foster.

## 2018-04-29 NOTE — ED Triage Notes (Addendum)
Arrived via EMS from home lives with daughter. EMS called out twice first for shortness of breath and EMS reported oxygen level 80% room air increased to 98% with oxygen. An hour later EMS called again patient was hallucinating per daughter patient ambulating hit head, bilateral forearms and finger abrasions from hitting wall bandaged by EMS prior to arrival. Patient alert answering and following commands appropriate.

## 2018-05-01 ENCOUNTER — Inpatient Hospital Stay (HOSPITAL_COMMUNITY)
Admission: EM | Admit: 2018-05-01 | Discharge: 2018-06-12 | DRG: 871 | Disposition: E | Payer: Medicare Other | Attending: Internal Medicine | Admitting: Internal Medicine

## 2018-05-01 ENCOUNTER — Emergency Department (HOSPITAL_COMMUNITY): Payer: Medicare Other

## 2018-05-01 ENCOUNTER — Other Ambulatory Visit: Payer: Self-pay

## 2018-05-01 ENCOUNTER — Encounter (HOSPITAL_COMMUNITY): Payer: Self-pay | Admitting: Emergency Medicine

## 2018-05-01 DIAGNOSIS — N179 Acute kidney failure, unspecified: Secondary | ICD-10-CM | POA: Diagnosis not present

## 2018-05-01 DIAGNOSIS — R296 Repeated falls: Secondary | ICD-10-CM | POA: Diagnosis present

## 2018-05-01 DIAGNOSIS — Z87891 Personal history of nicotine dependence: Secondary | ICD-10-CM

## 2018-05-01 DIAGNOSIS — E162 Hypoglycemia, unspecified: Secondary | ICD-10-CM

## 2018-05-01 DIAGNOSIS — Z950 Presence of cardiac pacemaker: Secondary | ICD-10-CM

## 2018-05-01 DIAGNOSIS — E872 Acidosis, unspecified: Secondary | ICD-10-CM

## 2018-05-01 DIAGNOSIS — I110 Hypertensive heart disease with heart failure: Secondary | ICD-10-CM | POA: Diagnosis present

## 2018-05-01 DIAGNOSIS — I5033 Acute on chronic diastolic (congestive) heart failure: Secondary | ICD-10-CM

## 2018-05-01 DIAGNOSIS — A419 Sepsis, unspecified organism: Principal | ICD-10-CM | POA: Diagnosis present

## 2018-05-01 DIAGNOSIS — R4182 Altered mental status, unspecified: Secondary | ICD-10-CM

## 2018-05-01 DIAGNOSIS — R4020 Unspecified coma: Secondary | ICD-10-CM

## 2018-05-01 DIAGNOSIS — R6520 Severe sepsis without septic shock: Secondary | ICD-10-CM | POA: Diagnosis not present

## 2018-05-01 DIAGNOSIS — Z79891 Long term (current) use of opiate analgesic: Secondary | ICD-10-CM

## 2018-05-01 DIAGNOSIS — G9349 Other encephalopathy: Secondary | ICD-10-CM | POA: Diagnosis present

## 2018-05-01 DIAGNOSIS — W19XXXA Unspecified fall, initial encounter: Secondary | ICD-10-CM | POA: Diagnosis present

## 2018-05-01 DIAGNOSIS — R0689 Other abnormalities of breathing: Secondary | ICD-10-CM | POA: Diagnosis not present

## 2018-05-01 DIAGNOSIS — T68XXXA Hypothermia, initial encounter: Secondary | ICD-10-CM

## 2018-05-01 DIAGNOSIS — E875 Hyperkalemia: Secondary | ICD-10-CM

## 2018-05-01 DIAGNOSIS — R404 Transient alteration of awareness: Secondary | ICD-10-CM | POA: Diagnosis not present

## 2018-05-01 DIAGNOSIS — Z7901 Long term (current) use of anticoagulants: Secondary | ICD-10-CM

## 2018-05-01 DIAGNOSIS — J90 Pleural effusion, not elsewhere classified: Secondary | ICD-10-CM | POA: Diagnosis not present

## 2018-05-01 DIAGNOSIS — Z515 Encounter for palliative care: Secondary | ICD-10-CM

## 2018-05-01 DIAGNOSIS — I4891 Unspecified atrial fibrillation: Secondary | ICD-10-CM | POA: Diagnosis present

## 2018-05-01 DIAGNOSIS — J811 Chronic pulmonary edema: Secondary | ICD-10-CM

## 2018-05-01 DIAGNOSIS — N390 Urinary tract infection, site not specified: Secondary | ICD-10-CM

## 2018-05-01 DIAGNOSIS — Z951 Presence of aortocoronary bypass graft: Secondary | ICD-10-CM

## 2018-05-01 DIAGNOSIS — Z66 Do not resuscitate: Secondary | ICD-10-CM | POA: Diagnosis present

## 2018-05-01 DIAGNOSIS — R531 Weakness: Secondary | ICD-10-CM | POA: Diagnosis not present

## 2018-05-01 DIAGNOSIS — Z7189 Other specified counseling: Secondary | ICD-10-CM

## 2018-05-01 DIAGNOSIS — R52 Pain, unspecified: Secondary | ICD-10-CM

## 2018-05-01 DIAGNOSIS — J9601 Acute respiratory failure with hypoxia: Secondary | ICD-10-CM

## 2018-05-01 DIAGNOSIS — I959 Hypotension, unspecified: Secondary | ICD-10-CM | POA: Diagnosis not present

## 2018-05-01 DIAGNOSIS — J810 Acute pulmonary edema: Secondary | ICD-10-CM | POA: Diagnosis not present

## 2018-05-01 DIAGNOSIS — Z79899 Other long term (current) drug therapy: Secondary | ICD-10-CM

## 2018-05-01 DIAGNOSIS — Z880 Allergy status to penicillin: Secondary | ICD-10-CM

## 2018-05-01 DIAGNOSIS — R0902 Hypoxemia: Secondary | ICD-10-CM | POA: Diagnosis not present

## 2018-05-01 LAB — COMPREHENSIVE METABOLIC PANEL
ALT: 30 U/L (ref 0–44)
AST: 49 U/L — ABNORMAL HIGH (ref 15–41)
Albumin: 3.2 g/dL — ABNORMAL LOW (ref 3.5–5.0)
Alkaline Phosphatase: 116 U/L (ref 38–126)
Anion gap: 12 (ref 5–15)
BUN: 73 mg/dL — ABNORMAL HIGH (ref 8–23)
CO2: 16 mmol/L — AB (ref 22–32)
Calcium: 8.6 mg/dL — ABNORMAL LOW (ref 8.9–10.3)
Chloride: 109 mmol/L (ref 98–111)
Creatinine, Ser: 2.91 mg/dL — ABNORMAL HIGH (ref 0.61–1.24)
GFR calc non Af Amer: 17 mL/min — ABNORMAL LOW (ref >60)
GFR, EST AFRICAN AMERICAN: 19 mL/min — AB (ref >60)
Glucose, Bld: 80 mg/dL (ref 70–99)
Potassium: 6.3 mmol/L (ref 3.5–5.1)
SODIUM: 137 mmol/L (ref 135–145)
Total Bilirubin: 1.3 mg/dL — ABNORMAL HIGH (ref 0.3–1.2)
Total Protein: 6.3 g/dL — ABNORMAL LOW (ref 6.5–8.1)

## 2018-05-01 LAB — URINALYSIS, COMPLETE (UACMP) WITH MICROSCOPIC
Bilirubin Urine: NEGATIVE
Glucose, UA: NEGATIVE mg/dL
Ketones, ur: NEGATIVE mg/dL
NITRITE: NEGATIVE
Protein, ur: NEGATIVE mg/dL
Specific Gravity, Urine: 1.018 (ref 1.005–1.030)
WBC, UA: >50 WBC/hpf — ABNORMAL HIGH (ref 0–5)
pH: 5 (ref 5.0–8.0)

## 2018-05-01 LAB — CBC WITH DIFFERENTIAL/PLATELET
Abs Immature Granulocytes: 0.08 10*3/uL — ABNORMAL HIGH (ref 0.00–0.07)
BASOS ABS: 0 10*3/uL (ref 0.0–0.1)
BASOS PCT: 0 %
Eosinophils Absolute: 0 10*3/uL (ref 0.0–0.5)
Eosinophils Relative: 0 %
HCT: 36.5 % — ABNORMAL LOW (ref 39.0–52.0)
Hemoglobin: 11.6 g/dL — ABNORMAL LOW (ref 13.0–17.0)
Immature Granulocytes: 1 %
Lymphocytes Relative: 5 %
Lymphs Abs: 0.6 10*3/uL — ABNORMAL LOW (ref 0.7–4.0)
MCH: 31.6 pg (ref 26.0–34.0)
MCHC: 31.8 g/dL (ref 30.0–36.0)
MCV: 99.5 fL (ref 80.0–100.0)
Monocytes Absolute: 1.1 10*3/uL — ABNORMAL HIGH (ref 0.1–1.0)
Monocytes Relative: 9 %
Neutro Abs: 10.5 10*3/uL — ABNORMAL HIGH (ref 1.7–7.7)
Neutrophils Relative %: 85 %
Platelets: 182 10*3/uL (ref 150–400)
RBC: 3.67 MIL/uL — AB (ref 4.22–5.81)
RDW: 16.1 % — ABNORMAL HIGH (ref 11.5–15.5)
WBC: 12.2 10*3/uL — ABNORMAL HIGH (ref 4.0–10.5)
nRBC: 0.4 % — ABNORMAL HIGH (ref 0.0–0.2)

## 2018-05-01 LAB — RESPIRATORY PANEL BY PCR
Adenovirus: NOT DETECTED
Bordetella pertussis: NOT DETECTED
CORONAVIRUS OC43-RVPPCR: NOT DETECTED
Chlamydophila pneumoniae: NOT DETECTED
Coronavirus 229E: NOT DETECTED
Coronavirus HKU1: NOT DETECTED
Coronavirus NL63: NOT DETECTED
Influenza A: NOT DETECTED
Influenza B: NOT DETECTED
Metapneumovirus: NOT DETECTED
Mycoplasma pneumoniae: NOT DETECTED
PARAINFLUENZA VIRUS 1-RVPPCR: NOT DETECTED
Parainfluenza Virus 2: NOT DETECTED
Parainfluenza Virus 3: NOT DETECTED
Parainfluenza Virus 4: NOT DETECTED
Respiratory Syncytial Virus: NOT DETECTED
Rhinovirus / Enterovirus: NOT DETECTED

## 2018-05-01 LAB — LACTIC ACID, PLASMA
Lactic Acid, Venous: 2.6 mmol/L (ref 0.5–1.9)
Lactic Acid, Venous: 2.9 mmol/L (ref 0.5–1.9)

## 2018-05-01 LAB — CBG MONITORING, ED
Glucose-Capillary: 62 mg/dL — ABNORMAL LOW (ref 70–99)
Glucose-Capillary: 60 mg/dL — ABNORMAL LOW (ref 70–99)
Glucose-Capillary: 141 mg/dL — ABNORMAL HIGH (ref 70–99)

## 2018-05-01 LAB — INFLUENZA PANEL BY PCR (TYPE A & B)
Influenza A By PCR: NEGATIVE
Influenza B By PCR: NEGATIVE

## 2018-05-01 LAB — PROTIME-INR
INR: 1.9 — ABNORMAL HIGH (ref 0.8–1.2)
Prothrombin Time: 21.7 seconds — ABNORMAL HIGH (ref 11.4–15.2)

## 2018-05-01 MED ORDER — SODIUM CHLORIDE 0.9 % IV BOLUS
1000.0000 mL | Freq: Once | INTRAVENOUS | Status: AC
  Administered 2018-05-01: 1000 mL via INTRAVENOUS

## 2018-05-01 MED ORDER — DEXTROSE 50 % IV SOLN
1.0000 | Freq: Once | INTRAVENOUS | Status: AC
  Administered 2018-05-01: 50 mL via INTRAVENOUS
  Filled 2018-05-01: qty 50

## 2018-05-01 MED ORDER — LORAZEPAM 2 MG/ML IJ SOLN
0.5000 mg | Freq: Once | INTRAMUSCULAR | Status: AC
  Administered 2018-05-01: 0.5 mg via INTRAVENOUS
  Filled 2018-05-01: qty 1

## 2018-05-01 MED ORDER — LORAZEPAM 2 MG/ML PO CONC: 1.0000 mg | ORAL | Status: DC | PRN

## 2018-05-01 MED ORDER — ACETAMINOPHEN 325 MG PO TABS: 650.0000 mg | ORAL_TABLET | Freq: Four times a day (QID) | ORAL | Status: DC | PRN

## 2018-05-01 MED ORDER — MORPHINE SULFATE (PF) 2 MG/ML IV SOLN
1.0000 mg | INTRAVENOUS | Status: DC | PRN
  Administered 2018-05-01 – 2018-05-02 (×2): 1 mg via INTRAVENOUS
  Filled 2018-05-01 (×3): qty 1

## 2018-05-01 MED ORDER — SODIUM BICARBONATE 8.4 % IV SOLN
50.0000 meq | Freq: Once | INTRAVENOUS | Status: AC
  Administered 2018-05-01: 50 meq via INTRAVENOUS
  Filled 2018-05-01: qty 50

## 2018-05-01 MED ORDER — ACETAMINOPHEN 650 MG RE SUPP: 650.0000 mg | Freq: Four times a day (QID) | RECTAL | Status: DC | PRN

## 2018-05-01 MED ORDER — LORAZEPAM 1 MG PO TABS: 1.0000 mg | ORAL_TABLET | ORAL | Status: DC | PRN

## 2018-05-01 MED ORDER — SODIUM CHLORIDE 0.9 % IV SOLN
INTRAVENOUS | Status: DC
  Administered 2018-05-01: 17:00:00 via INTRAVENOUS

## 2018-05-01 MED ORDER — ATROPINE SULFATE 1 % OP SOLN
4.0000 [drp] | OPHTHALMIC | Status: DC | PRN
  Filled 2018-05-01: qty 2

## 2018-05-01 MED ORDER — SODIUM CHLORIDE 0.9 % IV SOLN
1.0000 g | INTRAVENOUS | Status: DC
  Administered 2018-05-01: 1 g via INTRAVENOUS
  Filled 2018-05-01: qty 10

## 2018-05-01 MED ORDER — LORAZEPAM 2 MG/ML IJ SOLN
1.0000 mg | INTRAMUSCULAR | Status: DC | PRN
  Filled 2018-05-01: qty 1

## 2018-05-01 NOTE — ED Triage Notes (Signed)
Pt BIB EMS from home. Daughter reports patient was recently diagnosed with UTI, but has had a decrease in mental status since. EMS noted patient cold to touch with poor perfusion. Patient reported to have some noted work of breathing, but oxygen sats 100% on RA. Recently (in last 6 weeks) had pacemaker placed. Pt noted to have skin tear to left arm from a fall last week, per family. EMS VS: CBG=89, 80% RA, 95% 3LNC, RR20, HR 70 paced, 122/76. 20G Left posterior forearm PIV placed by EMS.

## 2018-05-01 NOTE — Progress Notes (Signed)
ED TO INPATIENT HANDOFF REPORT  Name/Age/Gender Justin [name] 85 y.o. male  Code Status    Code Status Orders  (From admission, onward)         Start     Ordered   04/23/2018 2041  Do not attempt resuscitation (DNR)  Continuous    Question Answer Comment  In the event of cardiac or respiratory ARREST Do not call a "code blue"   In the event of cardiac or respiratory ARREST Do not perform Intubation, CPR, defibrillation or ACLS   In the event of cardiac or respiratory ARREST Use medication by any route, position, wound care, and other measures to relive pain and suffering. May use oxygen, suction and manual treatment of airway obstruction as needed for comfort.      05/11/2018 2042        Code Status History    Date Active Date Inactive Code Status Order ID Comments User Context   05/12/2018 2042 04/18/2018 2042 DNR [id number]  Shela Leff, MD ED   04/12/2018 1635 04/28/2018 2042 DNR [id number]  Drenda Freeze, MD ED   04/02/2018 1254 04/03/2018 1548 Full Code [id number]  Evans Lance, MD Inpatient   03/17/2018 1214 03/19/2018 [place] Full Code [id number]  Baldwin Jamaica, PA-C ED    Advance Directive Documentation     Most Recent Value  Type of Advance Directive  -- [daughter states family is not sure if they have advance directives in place or not]  Pre-existing out of facility DNR order (yellow form or pink MOST form)  -  "MOST" Form in Place?  -      Home/SNF/Other Home  Chief Complaint UTI  Level of Care/Admitting Diagnosis ED Disposition    ED Disposition Condition [place]: [facility] [100102]  Level of Care: Med-Surg [16]  Diagnosis: Severe sepsis [facility]) [[id number]]  Admitting Physician: Shela Leff [[id number]]  Attending Physician: Shela Leff [[id number]]  PT Class (Do Not Modify): Observation [104]  PT Acc Code (Do Not Modify): Observation [10022]       Medical History Past Medical History:   Diagnosis Date  . Hypertension   . Symptomatic bradycardia     Allergies Allergies  Allergen Reactions  . Penicillins Rash    Did it involve swelling of the face/tongue/throat, SOB, or low BP? No Did it involve sudden or severe rash/hives, skin peeling, or any reaction on the inside of your mouth or nose? No Did you need to seek medical attention at a hospital or doctor's office? No When did it last happen?40 years ago If all above answers are "NO", may proceed with cephalosporin use.    IV Location/Drains/Wounds Patient Lines/Drains/Airways Status   Active Line/Drains/Airways    Name:   Placement date:   Placement time:   Site:   Days:   Peripheral IV 04/02/18 Right Arm   04/02/18    0832    Arm   29   Peripheral IV 04/15/2018 Right Wrist   05/07/2018    1502    Wrist   less than 1   Peripheral IV 04/15/2018 Left Forearm   05/02/2018    1607    Forearm   less than 1   Incision (Closed) 03/18/18 Chest Left;Upper   03/18/18    1630     44          Labs/Imaging Results for orders placed or performed during the hospital encounter of 04/17/2018 (from the past 48 hour(s))  CBG monitoring, ED     Status: Abnormal   Collection Time: 05/09/2018  2:31 PM  Result Value Ref Range   Glucose-Capillary 62 (L) 70 - 99 mg/dL  Comprehensive metabolic panel     Status: Abnormal   Collection Time: 04/13/2018  3:06 PM  Result Value Ref Range   Sodium 137 135 - 145 mmol/L   Potassium 6.3 (HH) 3.5 - 5.1 mmol/L    Comment: CRITICAL RESULT CALLED TO, READ BACK BY AND VERIFIED WITH: HALL,C. RN @1602  ON 03.20.2020 BY COHEN,K    Chloride 109 98 - 111 mmol/L   CO2 16 (L) 22 - 32 mmol/L   Glucose, Bld 80 70 - 99 mg/dL   BUN 73 (H) 8 - 23 mg/dL   Creatinine, Ser 2.91 (H) 0.61 - 1.24 mg/dL   Calcium 8.6 (L) 8.9 - 10.3 mg/dL   Total Protein 6.3 (L) 6.5 - 8.1 g/dL   Albumin 3.2 (L) 3.5 - 5.0 g/dL   AST 49 (H) 15 - 41 U/L   ALT 30 0 - 44 U/L   Alkaline Phosphatase 116 38 - 126 U/L   Total Bilirubin 1.3  (H) 0.3 - 1.2 mg/dL   GFR calc non Af Amer 17 (L) >60 mL/min   GFR calc Af Amer 19 (L) >60 mL/min   Anion gap 12 5 - 15    Comment: Performed at [facility], [place] [address], [place], [place] [address]  CBC WITH DIFFERENTIAL     Status: Abnormal   Collection Time: 04/21/2018  3:06 PM  Result Value Ref Range   WBC 12.2 (H) 4.0 - 10.5 K/uL   RBC 3.67 (L) 4.22 - 5.81 MIL/uL   Hemoglobin 11.6 (L) 13.0 - 17.0 g/dL   HCT 36.5 (L) 39.0 - 52.0 %   MCV 99.5 80.0 - 100.0 fL   MCH 31.6 26.0 - 34.0 pg   MCHC 31.8 30.0 - 36.0 g/dL   RDW 16.1 (H) 11.5 - 15.5 %   Platelets 182 150 - 400 K/uL   nRBC 0.4 (H) 0.0 - 0.2 %   Neutrophils Relative % 85 %   Neutro Abs 10.5 (H) 1.7 - 7.7 K/uL   Lymphocytes Relative 5 %   Lymphs Abs 0.6 (L) 0.7 - 4.0 K/uL   Monocytes Relative 9 %   Monocytes Absolute 1.1 (H) 0.1 - 1.0 K/uL   Eosinophils Relative 0 %   Eosinophils Absolute 0.0 0.0 - 0.5 K/uL   Basophils Relative 0 %   Basophils Absolute 0.0 0.0 - 0.1 K/uL   Immature Granulocytes 1 %   Abs Immature Granulocytes 0.08 (H) 0.00 - 0.07 K/uL    Comment: Performed at [facility], [place] [address], [place], [place] [address]  Lactic acid, plasma     Status: Abnormal   Collection Time: 05/04/2018  3:06 PM  Result Value Ref Range   Lactic Acid, Venous 2.6 (HH) 0.5 - 1.9 mmol/L    Comment: CRITICAL RESULT CALLED TO, READ BACK BY AND VERIFIED WITH: HALL,C. RN @1536  ON 03.20.2020 BY COHEN,K Performed at [facility], [place] [address] [place], [place] [address]   Protime-INR     Status: Abnormal   Collection Time: 04/26/2018  3:06 PM  Result Value Ref Range   Prothrombin Time 21.7 (H) 11.4 - 15.2 seconds   INR 1.9 (H) 0.8 - 1.2    Comment: (NOTE) INR goal varies based on device and disease states. Performed at [facility], [address] [place], [place] [address]   CBG monitoring, ED     Status: Abnormal   Collection Time: 04/29/2018   4:16 PM  Result Value Ref Range   Glucose-Capillary 60 (L) 70 - 99 mg/dL  Urinalysis, Complete w Microscopic     Status: Abnormal   Collection Time: 05/03/2018  4:50 PM  Result Value Ref Range   Color, Urine AMBER (A) YELLOW    Comment: BIOCHEMICALS MAY BE AFFECTED BY COLOR   APPearance HAZY (A) CLEAR   Specific Gravity, Urine 1.018 1.005 - 1.030   pH 5.0 5.0 - 8.0   Glucose, UA NEGATIVE NEGATIVE mg/dL   Hgb urine dipstick MODERATE (A) NEGATIVE   Bilirubin Urine NEGATIVE NEGATIVE   Ketones, ur NEGATIVE NEGATIVE mg/dL   Protein, ur NEGATIVE NEGATIVE mg/dL   Nitrite NEGATIVE NEGATIVE   Leukocytes,Ua LARGE (A) NEGATIVE   RBC / HPF 0-5 0 - 5 RBC/hpf   WBC, UA >50 (H) 0 - 5 WBC/hpf   Bacteria, UA RARE (A) NONE SEEN   Squamous Epithelial / LPF 0-5 0 - 5   WBC Clumps PRESENT    Mucus PRESENT     Comment: Performed at [facility], [place] [address] [place], [place] [address]  CBG monitoring, ED     Status: Abnormal   Collection Time: 04/29/2018  5:49 PM  Result Value Ref Range   Glucose-Capillary 141 (H) 70 - 99 mg/dL  Influenza panel by PCR (type A & B)     Status: None   Collection Time: 05/04/2018  6:16 PM  Result Value Ref Range   Influenza A By PCR NEGATIVE NEGATIVE   Influenza B By PCR NEGATIVE NEGATIVE    Comment: (NOTE) The Xpert Xpress Flu assay is intended as an aid in the diagnosis of  influenza and should not be used as a sole basis for treatment.  This  assay is FDA approved for nasopharyngeal swab specimens only. Nasal  washings and aspirates are unacceptable for Xpert Xpress Flu testing. Performed at [facility], [place] [address] [place],  [address]   Lactic acid, plasma     Status: Abnormal   Collection Time: 04/21/2018  6:54 PM  Result Value Ref Range   Lactic Acid, Venous 2.9 (HH) 0.5 - 1.9 mmol/L    Comment: CRITICAL RESULT CALLED TO, READ BACK BY AND VERIFIED WITH: Yafet Cline,S RN @1931  ON 05/07/2018 JACKSON,K Performed at  [facility], [place] [address], [place],  [address]    Ct Head Wo Contrast  Result Date: 04/24/2018 CLINICAL DATA:  Altered mental status.  Recent UTI diagnosis. EXAM: CT HEAD WITHOUT CONTRAST TECHNIQUE: Contiguous axial images were obtained from the base of the skull through the vertex without intravenous contrast. COMPARISON:  CT head dated April 29, 2018. FINDINGS: Brain: No evidence of acute infarction, hemorrhage, hydrocephalus, extra-axial collection or mass lesion/mass effect. Stable atrophy and chronic microvascular ischemic changes. Vascular: Calcified atherosclerosis at the skullbase. No hyperdense vessel. Skull: Negative for fracture or focal lesion. Sinuses/Orbits: No acute finding. Other: None. IMPRESSION: 1.  No acute intracranial abnormality.  No interval change. Electronically Signed   By: Titus Dubin M.D.   On: 04/29/2018 15:48   Dg Chest Port 1 View  Result Date: 04/13/2018 CLINICAL DATA:  Loss of consciousness. EXAM: PORTABLE CHEST 1 VIEW COMPARISON:  Chest x-ray 04/29/2018. FINDINGS: AICD in stable position. Prior CABG. Cardiomegaly with diffuse bilateral pulmonary infiltrates consistent bilateral pulmonary edema. Bilateral pneumonia can not be excluded.  Small bilateral pleural effusions. No pneumothorax. IMPRESSION: AICD in stable position. Prior CABG. Findings consistent with congestive heart failure bilateral pulmonary edema and small bilateral pleural effusions. Electronically Signed   By: Marcello Moores  Register   On: 04/26/2018 14:57    Pending Labs Unresulted Labs (From admission, onward)    Start     Ordered   04/22/2018 1558  Respiratory Panel by PCR  (Respiratory virus panel with precautions)  Once,   R     05/06/2018 1557   04/13/2018 1419  Blood Cultures (routine x 2)  BLOOD CULTURE X 2,   STAT     05/07/2018 1419   04/30/2018 1419  Urine culture  ONCE - STAT,   STAT     05/12/2018 1419          Vitals/Pain Today's Vitals   05/09/2018 1906 05/07/2018  1930 05/11/2018 2030 04/23/2018 2200  BP: 100/64 95/64 102/88 (!) 128/113  Pulse: 79 70 61 69  Resp: 18 19 (!) 21 18  Temp:      TempSrc:      SpO2: 100% 99% 93% 92%    Isolation Precautions No active isolations  Medications Medications  acetaminophen (TYLENOL) tablet 650 mg (has no administration in time range)    Or  acetaminophen (TYLENOL) suppository 650 mg (has no administration in time range)  morphine 2 MG/ML injection 1 mg (has no administration in time range)  LORazepam (ATIVAN) tablet 1 mg (has no administration in time range)    Or  LORazepam (ATIVAN) 2 MG/ML concentrated solution 1 mg (has no administration in time range)    Or  LORazepam (ATIVAN) injection 1 mg (has no administration in time range)  atropine 1 % ophthalmic solution 4 drop (has no administration in time range)  sodium chloride 0.9 % bolus 1,000 mL (0 mLs Intravenous Stopped 04/27/2018 1705)  LORazepam (ATIVAN) injection 0.5 mg (0.5 mg Intravenous Given 05/12/2018 1508)  dextrose 50 % solution 50 mL (50 mLs Intravenous Given 05/10/2018 1642)  sodium bicarbonate injection 50 mEq (50 mEq Intravenous Given 05/06/2018 1651)  LORazepam (ATIVAN) injection 0.5 mg (0.5 mg Intravenous Given 04/14/2018 1815)    Mobility walks with device

## 2018-05-01 NOTE — ED Notes (Signed)
Report called to receiving RN.

## 2018-05-01 NOTE — ED Provider Notes (Signed)
[facility] DEPT Provider Note   CSN: [id number] Arrival date & time: 04/30/2018  1303    History   Chief Complaint Chief Complaint  Patient presents with  . Altered Mental Status    HPI Justin [name] is a 85 y.o. male.     Pt presents to the ED today with decreased MS.  The pt is elderly, but is normally awake and alert.  He normally walks with a cane.  He came to the ED on 3/18 for a fall and MS change.  He was evaluated and d/c home.  No urine done.  Pt's pcp ordered a urine and pt had a UTI.  Pt started on abx on the evening of 3/18.  Family said he's even worse.  He has not been sleeping for 2 nights.  Pt unable to give any hx.       Past Medical History:  Diagnosis Date  . Hypertension   . Symptomatic bradycardia     Patient Active Problem List   Diagnosis Date Noted  . Complete heart block ([place]) 04/02/2018  . Atrial pacemaker lead displacement 04/02/2018  . Symptomatic bradycardia 03/17/2018    Past Surgical History:  Procedure Laterality Date  . BIV PACEMAKER INSERTION CRT-P N/A 03/18/2018   Procedure: BIV PACEMAKER INSERTION CRT-P;  Surgeon: Evans Lance, MD;  Location: [facility];  Service: Cardiovascular;  Laterality: N/A;  . CORONARY ARTERY BYPASS GRAFT    . LEAD REVISION/REPAIR N/A 04/02/2018   Procedure: LEAD REVISION/REPAIR;  Surgeon: Evans Lance, MD;  Location: [facility];  Service: Cardiovascular;  Laterality: N/A;        Home Medications    Prior to Admission medications   Medication Sig Start Date End Date Taking? Authorizing Provider  acetaminophen (TYLENOL) 500 MG tablet Take 1,000 mg by mouth daily.   Yes [provider]  atorvastatin (LIPITOR) 20 MG tablet Take 20 mg by mouth daily at 6 PM.  08/11/16  Yes [provider]  ELIQUIS 2.5 MG TABS tablet Take 2.5 mg by mouth 2 (two) times daily.  09/08/16  Yes [provider]  finasteride (PROSCAR) 5 MG tablet Take 5 mg  by mouth daily.  07/09/16  Yes [provider]  KLOR-CON M20 20 MEQ tablet Take 20 mEq by mouth daily.  09/15/16  Yes [provider]  LORazepam (ATIVAN) 0.5 MG tablet Take 0.25 mg by mouth at bedtime.   Yes [provider]  metoprolol succinate (TOPROL-XL) 50 MG 24 hr tablet Take 25 mg by mouth 2 (two) times daily. 02/02/18  Yes [provider]  senna (SENOKOT) 8.6 MG TABS tablet Take 1 tablet by mouth every 3 (three) days.   Yes [provider]  Sulfamethoxazole-Trimethoprim (SULFAMETHOXAZOLE-TMP DS PO) Take 1 tablet by mouth 2 (two) times daily.   Yes [provider]  tamsulosin (FLOMAX) 0.4 MG CAPS capsule Take 0.8 mg by mouth daily.  07/28/16  Yes [provider]  torsemide (DEMADEX) 20 MG tablet Take 20 mg by mouth daily.  08/11/16  Yes [provider]  traMADol-acetaminophen (ULTRACET) 37.5-325 MG tablet Take 1 tablet by mouth at bedtime.  02/08/18  Yes [provider]    Family History History reviewed. No pertinent family history.  Social History Social History   Tobacco Use  . Smoking status: Former Smoker    Last attempt to quit: 09/20/1966    Years since quitting: 51.6  . Smokeless tobacco: Never Used  Substance Use Topics  . Alcohol use: No  . Drug use: No     Allergies   Penicillins   Review of Systems Review of Systems  Unable to perform ROS: Mental status change  All other systems reviewed and are negative.    Physical Exam Updated Vital Signs BP (!) 145/133 (BP Location: Right Arm)   Pulse (!) 57   Temp (!) 96.4 F (35.8 C) (Rectal)   Resp 18   SpO2 96%   Physical Exam Vitals signs and nursing note reviewed.  Constitutional:      Appearance: He is ill-appearing.  HENT:     Head: Normocephalic and atraumatic.     Right Ear: External ear normal.     Left Ear: External ear normal.     Nose: Nose normal.     Mouth/Throat:     Mouth: Mucous membranes are dry.  Eyes:      Extraocular Movements: Extraocular movements intact.     Pupils: Pupils are equal, round, and reactive to light.  Neck:     Musculoskeletal: Normal range of motion and neck supple.  Cardiovascular:     Rate and Rhythm: Regular rhythm. Tachycardia present.     Pulses: Normal pulses.     Heart sounds: Normal heart sounds.  Pulmonary:     Effort: Tachypnea present.  Abdominal:     General: Abdomen is flat. Bowel sounds are normal.     Palpations: Abdomen is soft.  Musculoskeletal: Normal range of motion.  Skin:    General: Skin is warm.     Capillary Refill: Capillary refill takes less than 2 seconds.  Neurological:     Mental Status: He is lethargic and disoriented.  Psychiatric:     Comments: Unable to assess      ED Treatments / Results  Labs (all labs ordered are listed, but only abnormal results are displayed) Labs Reviewed  COMPREHENSIVE METABOLIC PANEL - Abnormal; Notable for the following components:      Result Value   Potassium 6.3 (*)    CO2 16 (*)    BUN 73 (*)    Creatinine, Ser 2.91 (*)    Calcium 8.6 (*)    Total Protein 6.3 (*)    Albumin 3.2 (*)    AST 49 (*)    Total Bilirubin 1.3 (*)    GFR calc non Af Amer 17 (*)    GFR calc Af Amer 19 (*)    All other components within normal limits  CBC WITH DIFFERENTIAL/PLATELET - Abnormal; Notable for the following components:   WBC 12.2 (*)    RBC 3.67 (*)    Hemoglobin 11.6 (*)    HCT 36.5 (*)    RDW 16.1 (*)    nRBC 0.4 (*)    Neutro Abs 10.5 (*)    Lymphs Abs 0.6 (*)    Monocytes Absolute 1.1 (*)    Abs Immature Granulocytes 0.08 (*)    All other components within normal limits  LACTIC ACID, PLASMA - Abnormal; Notable for the following components:   Lactic Acid, Venous 2.6 (*)    All other components within normal limits  PROTIME-INR - Abnormal; Notable for the following components:   Prothrombin Time 21.7 (*)    INR 1.9 (*)    All other components within normal limits  CBG MONITORING, ED -  Abnormal; Notable for the following components:   Glucose-Capillary 62 (*)    All other components within normal limits  CULTURE, BLOOD (ROUTINE X  2)  CULTURE, BLOOD (ROUTINE X 2)  URINE CULTURE  RESPIRATORY PANEL BY PCR  URINALYSIS, COMPLETE (UACMP) WITH MICROSCOPIC  INFLUENZA PANEL BY PCR (TYPE A & B)    EKG None  Radiology Ct Head Wo Contrast  Result Date: 04/12/2018 CLINICAL DATA:  Altered mental status.  Recent UTI diagnosis. EXAM: CT HEAD WITHOUT CONTRAST TECHNIQUE: Contiguous axial images were obtained from the base of the skull through the vertex without intravenous contrast. COMPARISON:  CT head dated April 29, 2018. FINDINGS: Brain: No evidence of acute infarction, hemorrhage, hydrocephalus, extra-axial collection or mass lesion/mass effect. Stable atrophy and chronic microvascular ischemic changes. Vascular: Calcified atherosclerosis at the skullbase. No hyperdense vessel. Skull: Negative for fracture or focal lesion. Sinuses/Orbits: No acute finding. Other: None. IMPRESSION: 1.  No acute intracranial abnormality.  No interval change. Electronically Signed   By: Titus Dubin M.D.   On: 05/11/2018 15:48   Dg Chest Port 1 View  Result Date: 04/13/2018 CLINICAL DATA:  Loss of consciousness. EXAM: PORTABLE CHEST 1 VIEW COMPARISON:  Chest x-ray 04/29/2018. FINDINGS: AICD in stable position. Prior CABG. Cardiomegaly with diffuse bilateral pulmonary infiltrates consistent bilateral pulmonary edema. Bilateral pneumonia can not be excluded. Small bilateral pleural effusions. No pneumothorax. IMPRESSION: AICD in stable position. Prior CABG. Findings consistent with congestive heart failure bilateral pulmonary edema and small bilateral pleural effusions. Electronically Signed   By: Marcello Moores  Register   On: 04/17/2018 14:57    Procedures Procedures (including critical care time)  Medications Ordered in ED Medications  sodium chloride 0.9 % bolus 1,000 mL (1,000 mLs Intravenous New  Bag/Given 04/30/2018 1508)    And  0.9 %  sodium chloride infusion (has no administration in time range)  cefTRIAXone (ROCEPHIN) 1 g in sodium chloride 0.9 % 100 mL IVPB (1 g Intravenous New Bag/Given 04/22/2018 1613)  LORazepam (ATIVAN) injection 0.5 mg (0.5 mg Intravenous Given 04/24/2018 1508)     Initial Impression / Assessment and Plan / ED Course  I have reviewed the triage vital signs and the nursing notes.  Pertinent labs & imaging results that were available during my care of the patient were reviewed by me and considered in my medical decision making (see chart for details).  CRITICAL CARE Performed by: Isla Pence   Total critical care time: 30 minutes  Critical care time was exclusive of separately billable procedures and treating other patients.  Critical care was necessary to treat or prevent imminent or life-threatening deterioration.  Critical care was time spent personally by me on the following activities: development of treatment plan with patient and/or surrogate as well as nursing, discussions with consultants, evaluation of patient's response to treatment, examination of patient, obtaining history from patient or surrogate, ordering and performing treatments and interventions, ordering and review of laboratory studies, ordering and review of radiographic studies, pulse oximetry and re-evaluation of patient's condition.     Pt is mildly hyperkalemic with AKI.  The pt is given IVFs, but he also has CHF.  He is oxygenating well on 2L.  I suspect he is septic from UTI, but I am waiting for UA.  IV rocephin started. Bair hugger started as he is hypothermic. Flu and Resp panel ordered as well.  His daughter told that he is very ill.    Pt signed out to Dr. Darl Householder at shift change.  Final Clinical Impressions(s) / ED Diagnoses   Final diagnoses:  Altered mental status, unspecified altered mental status type  Sepsis, due to unspecified organism, unspecified whether acute  organ  dysfunction present [facility])  AKI (acute kidney injury) [facility])    ED Discharge Orders    None       Isla Pence, MD 04/25/2018 [phone]

## 2018-05-01 NOTE — ED Provider Notes (Signed)
  Physical Exam  BP (!) 145/133 (BP Location: Right Arm)   Pulse (!) 107   Temp (!) 96.4 F (35.8 C) (Rectal)   Resp 19   SpO2 (!) 84%   Physical Exam  ED Course/Procedures     Procedures  MDM  Patient care assumed at 4 pm. Patient is here with AMS.  Patient has acute renal failure likely secondary to UTI.  Patient is also hypothermic and meets sepsis criteria.  Patient was given IV Rocephin and IV fluids.  Dr. Gilford Raid had a initial discussion with the family and patient is DNR.  Signout pending UA and admission.   5 pm I had code discussion with daughter and confirmed DNR. UA pending.   7:09 PM UA + UTI. Given rocephin. Patient hypoxic and hypotensive now. I had another discussion with daughter. Patient is full comfort care now and she wants to focus on his comfort. Given ativan and calm and sleeping now.   7:43 PM Lactate elevated to 2.9 but will transition to full comfort. Patient has poor prognosis.       Drenda Freeze, MD 04/23/2018 [phone]

## 2018-05-01 NOTE — ED Notes (Signed)
Date and time results received: 05/12/2018 3:36 PM  (use smartphrase ".now" to insert current time)  Test: lactic acid  Critical Value: 2.6  Name of Provider Notified: sonya primary RN   Orders Received? Or Actions Taken?:

## 2018-05-01 NOTE — ED Notes (Signed)
Bed: WA05 Expected date:  Expected time:  Means of arrival:  Comments: EMS-UTI 

## 2018-05-01 NOTE — ED Notes (Addendum)
Date and time results received: 04/25/2018 4:02 PM  (use smartphrase ".now" to insert current time)  Test: potassium  Critical Value: 6.3  Name of Provider Notified: sonya primary RN   Orders Received? Or Actions Taken?: Dr Gilford Raid

## 2018-05-01 NOTE — ED Notes (Signed)
Antibiotics started after one set of blood cultures. Writer will try to get a second set.

## 2018-05-02 DIAGNOSIS — R296 Repeated falls: Secondary | ICD-10-CM | POA: Diagnosis present

## 2018-05-02 DIAGNOSIS — E162 Hypoglycemia, unspecified: Secondary | ICD-10-CM

## 2018-05-02 DIAGNOSIS — Z87891 Personal history of nicotine dependence: Secondary | ICD-10-CM | POA: Diagnosis not present

## 2018-05-02 DIAGNOSIS — J9601 Acute respiratory failure with hypoxia: Secondary | ICD-10-CM

## 2018-05-02 DIAGNOSIS — E872 Acidosis, unspecified: Secondary | ICD-10-CM

## 2018-05-02 DIAGNOSIS — Z7901 Long term (current) use of anticoagulants: Secondary | ICD-10-CM | POA: Diagnosis not present

## 2018-05-02 DIAGNOSIS — R6520 Severe sepsis without septic shock: Secondary | ICD-10-CM

## 2018-05-02 DIAGNOSIS — Z880 Allergy status to penicillin: Secondary | ICD-10-CM | POA: Diagnosis not present

## 2018-05-02 DIAGNOSIS — N390 Urinary tract infection, site not specified: Secondary | ICD-10-CM

## 2018-05-02 DIAGNOSIS — N179 Acute kidney failure, unspecified: Secondary | ICD-10-CM | POA: Diagnosis not present

## 2018-05-02 DIAGNOSIS — E875 Hyperkalemia: Secondary | ICD-10-CM

## 2018-05-02 DIAGNOSIS — R4182 Altered mental status, unspecified: Secondary | ICD-10-CM | POA: Diagnosis not present

## 2018-05-02 DIAGNOSIS — Z79891 Long term (current) use of opiate analgesic: Secondary | ICD-10-CM | POA: Diagnosis not present

## 2018-05-02 DIAGNOSIS — I4891 Unspecified atrial fibrillation: Secondary | ICD-10-CM | POA: Diagnosis present

## 2018-05-02 DIAGNOSIS — R52 Pain, unspecified: Secondary | ICD-10-CM | POA: Diagnosis not present

## 2018-05-02 DIAGNOSIS — A419 Sepsis, unspecified organism: Principal | ICD-10-CM

## 2018-05-02 DIAGNOSIS — J810 Acute pulmonary edema: Secondary | ICD-10-CM | POA: Diagnosis not present

## 2018-05-02 DIAGNOSIS — Z7189 Other specified counseling: Secondary | ICD-10-CM

## 2018-05-02 DIAGNOSIS — I110 Hypertensive heart disease with heart failure: Secondary | ICD-10-CM | POA: Diagnosis present

## 2018-05-02 DIAGNOSIS — T68XXXA Hypothermia, initial encounter: Secondary | ICD-10-CM

## 2018-05-02 DIAGNOSIS — Z515 Encounter for palliative care: Secondary | ICD-10-CM

## 2018-05-02 DIAGNOSIS — T68XXXD Hypothermia, subsequent encounter: Secondary | ICD-10-CM

## 2018-05-02 DIAGNOSIS — W19XXXA Unspecified fall, initial encounter: Secondary | ICD-10-CM | POA: Diagnosis present

## 2018-05-02 DIAGNOSIS — I5033 Acute on chronic diastolic (congestive) heart failure: Secondary | ICD-10-CM | POA: Diagnosis present

## 2018-05-02 DIAGNOSIS — Z66 Do not resuscitate: Secondary | ICD-10-CM | POA: Diagnosis present

## 2018-05-02 DIAGNOSIS — Z950 Presence of cardiac pacemaker: Secondary | ICD-10-CM | POA: Diagnosis not present

## 2018-05-02 DIAGNOSIS — Z79899 Other long term (current) drug therapy: Secondary | ICD-10-CM | POA: Diagnosis not present

## 2018-05-02 DIAGNOSIS — G9349 Other encephalopathy: Secondary | ICD-10-CM | POA: Diagnosis present

## 2018-05-02 DIAGNOSIS — J811 Chronic pulmonary edema: Secondary | ICD-10-CM

## 2018-05-02 DIAGNOSIS — Z951 Presence of aortocoronary bypass graft: Secondary | ICD-10-CM | POA: Diagnosis not present

## 2018-05-02 MED ORDER — GLYCOPYRROLATE 0.2 MG/ML IJ SOLN
0.2000 mg | INTRAMUSCULAR | Status: DC | PRN
  Filled 2018-05-02: qty 1

## 2018-05-02 MED ORDER — LORAZEPAM 2 MG/ML IJ SOLN
1.0000 mg | INTRAMUSCULAR | Status: DC | PRN
  Administered 2018-05-02: 1 mg via INTRAVENOUS

## 2018-05-02 MED ORDER — LORAZEPAM 2 MG/ML PO CONC: 1.0000 mg | ORAL | Status: DC | PRN

## 2018-05-02 MED ORDER — SODIUM CHLORIDE 0.9% FLUSH
3.0000 mL | Freq: Two times a day (BID) | INTRAVENOUS | Status: DC
  Administered 2018-05-02 – 2018-05-13 (×16): 3 mL via INTRAVENOUS

## 2018-05-02 MED ORDER — MORPHINE SULFATE (PF) 2 MG/ML IV SOLN
1.0000 mg | INTRAVENOUS | Status: DC | PRN
  Administered 2018-05-02 – 2018-05-08 (×12): 1 mg via INTRAVENOUS
  Filled 2018-05-02 (×10): qty 1

## 2018-05-02 MED ORDER — NYSTATIN 100000 UNIT/GM EX POWD: Freq: Three times a day (TID) | CUTANEOUS | Status: DC | PRN

## 2018-05-02 MED ORDER — BIOTENE DRY MOUTH MT LIQD: 15.0000 mL | OROMUCOSAL | Status: DC | PRN

## 2018-05-02 MED ORDER — HYDROMORPHONE HCL 1 MG/ML IJ SOLN
0.5000 mg | INTRAMUSCULAR | Status: DC | PRN
  Administered 2018-05-02 – 2018-05-08 (×12): 0.5 mg via INTRAVENOUS
  Filled 2018-05-02 (×12): qty 1

## 2018-05-02 MED ORDER — GLYCOPYRROLATE 1 MG PO TABS
1.0000 mg | ORAL_TABLET | ORAL | Status: DC | PRN
  Filled 2018-05-02: qty 1

## 2018-05-02 MED ORDER — ALUM & MAG HYDROXIDE-SIMETH 200-200-20 MG/5ML PO SUSP: 30.0000 mL | Freq: Four times a day (QID) | ORAL | Status: DC | PRN

## 2018-05-02 MED ORDER — GLYCOPYRROLATE 0.2 MG/ML IJ SOLN
0.2000 mg | INTRAMUSCULAR | Status: DC | PRN
  Administered 2018-05-02 – 2018-05-06 (×2): 0.2 mg via INTRAVENOUS
  Filled 2018-05-02 (×4): qty 1

## 2018-05-02 MED ORDER — ONDANSETRON HCL 4 MG/2ML IJ SOLN: 4.0000 mg | Freq: Four times a day (QID) | INTRAMUSCULAR | Status: DC | PRN

## 2018-05-02 MED ORDER — LORAZEPAM 2 MG/ML IJ SOLN: 1.0000 mg | INTRAMUSCULAR | Status: DC | PRN

## 2018-05-02 MED ORDER — ONDANSETRON 4 MG PO TBDP: 4.0000 mg | ORAL_TABLET | Freq: Four times a day (QID) | ORAL | Status: DC | PRN

## 2018-05-02 MED ORDER — HALOPERIDOL LACTATE 5 MG/ML IJ SOLN
0.5000 mg | INTRAMUSCULAR | Status: DC | PRN
  Administered 2018-05-04 – 2018-05-06 (×4): 0.5 mg via INTRAVENOUS
  Filled 2018-05-02 (×4): qty 1

## 2018-05-02 MED ORDER — LORAZEPAM 2 MG/ML IJ SOLN
1.0000 mg | INTRAMUSCULAR | Status: DC | PRN
  Administered 2018-05-02 – 2018-05-08 (×12): 1 mg via INTRAVENOUS
  Filled 2018-05-02 (×12): qty 1

## 2018-05-02 MED ORDER — HALOPERIDOL 0.5 MG PO TABS
0.5000 mg | ORAL_TABLET | ORAL | Status: DC | PRN
  Filled 2018-05-02: qty 1

## 2018-05-02 MED ORDER — MORPHINE SULFATE (CONCENTRATE) 10 MG/0.5ML PO SOLN: 5.0000 mg | ORAL | Status: DC | PRN

## 2018-05-02 MED ORDER — TRAZODONE HCL 50 MG PO TABS: 25.0000 mg | ORAL_TABLET | Freq: Every evening | ORAL | Status: DC | PRN

## 2018-05-02 MED ORDER — MAGIC MOUTHWASH W/LIDOCAINE
15.0000 mL | Freq: Four times a day (QID) | ORAL | Status: DC | PRN
  Filled 2018-05-02: qty 15

## 2018-05-02 MED ORDER — LORAZEPAM 1 MG PO TABS
1.0000 mg | ORAL_TABLET | ORAL | Status: DC | PRN
  Filled 2018-05-02: qty 1

## 2018-05-02 MED ORDER — BISACODYL 10 MG RE SUPP: 10.0000 mg | Freq: Every day | RECTAL | Status: DC | PRN

## 2018-05-02 MED ORDER — DIPHENHYDRAMINE HCL 50 MG/ML IJ SOLN: 12.5000 mg | INTRAMUSCULAR | Status: DC | PRN

## 2018-05-02 MED ORDER — ACETAMINOPHEN 325 MG PO TABS: 650.0000 mg | ORAL_TABLET | Freq: Four times a day (QID) | ORAL | Status: DC | PRN

## 2018-05-02 MED ORDER — LORAZEPAM 1 MG PO TABS: 1.0000 mg | ORAL_TABLET | ORAL | Status: DC | PRN

## 2018-05-02 MED ORDER — HALOPERIDOL LACTATE 2 MG/ML PO CONC
0.5000 mg | ORAL | Status: DC | PRN
  Filled 2018-05-02: qty 0.3

## 2018-05-02 MED ORDER — MORPHINE SULFATE (CONCENTRATE) 10 MG/0.5ML PO SOLN
5.0000 mg | ORAL | Status: DC | PRN
  Filled 2018-05-02: qty 0.5

## 2018-05-02 MED ORDER — POLYVINYL ALCOHOL 1.4 % OP SOLN: 1.0000 [drp] | Freq: Four times a day (QID) | OPHTHALMIC | Status: DC | PRN

## 2018-05-02 MED ORDER — ACETAMINOPHEN 650 MG RE SUPP: 650.0000 mg | Freq: Four times a day (QID) | RECTAL | Status: DC | PRN

## 2018-05-02 MED ORDER — SODIUM CHLORIDE 0.9 % IV SOLN
12.5000 mg | Freq: Four times a day (QID) | INTRAVENOUS | Status: DC | PRN
  Filled 2018-05-02: qty 0.5

## 2018-05-02 MED ORDER — ALBUTEROL SULFATE (2.5 MG/3ML) 0.083% IN NEBU: 2.5000 mg | INHALATION_SOLUTION | RESPIRATORY_TRACT | Status: DC | PRN

## 2018-05-02 NOTE — Progress Notes (Signed)
Patient was hypothermic upon arrival in the unit, warm blanket reinforced, temp rechecked at 0130am, with improvement - 37.34f; will continue to monitor patient.

## 2018-05-02 NOTE — H&P (Signed)
History and Physical    Justin [name] QMG:[id number] DOB: 10-16-1913 DOA: 04/28/2018  PCP: Carol Ada, MD Patient coming from: Home  Chief Complaint: Altered mental status  HPI: Justin [name] is a 85 y.o. male with medical history significant of hypertension, bradycardia status post pacemaker, atrial fibrillation, chronic diastolic congestive heart failure presenting to the hospital via EMS for evaluation of altered mental status.  No history could be obtained from the patient.  Daughter states patient has been declining for the past 3 days.  He has had multiple falls.  One fall last week and 2 falls this week.  States he was taken to the emergency room earlier this week and they were told he did not have any fractures.  He was also diagnosed with a UTI recently and placed on Bactrim.  Daughter states patient is very weak and has not been sleeping.  Daughter denies any fevers, nausea, vomiting, or diarrhea.  States patient has not complained of any pain.  ED Course: EMS noted patient was cool to touch with poor perfusion.  Hypothermic with temperature 96.4 F on arrival and placed on bair hugger.  Hypotensive.  Intermittently tachycardic and tachypneic.  SPO2 84% on room air.  CBG 62.  White count 12.2.  Lactic acid 2.6 > 2.9 after 1 L fluid bolus.  Patient continued to be hypotensive.  INR 1.9.  Potassium 6.3.  Bicarb 16.  BUN 73.  Creatinine 2.9, baseline 1.4-1.5.  UA with large amount of leukocytes, greater than 50 WBCs, and rare bacteria.  Influenza panel negative.  Respiratory viral panel negative.  Head CT negative for acute finding.  Chest x-ray with findings consistent with congestive heart failure including bilateral pulmonary edema and small bilateral pleural effusions. Patient received 1 amp D50, sodium bicarb 50 mEq, Ativan 1 mg total, and ceftriaxone.   ED provider had a goals of care discussion with the patient's daughter.  Daughter wanted the patient to be transition to full  comfort care.  Review of Systems: As per HPI otherwise 10 point review of systems negative.  Past Medical History:  Diagnosis Date   Hypertension    Symptomatic bradycardia     Past Surgical History:  Procedure Laterality Date   BIV PACEMAKER INSERTION CRT-P N/A 03/18/2018   Procedure: BIV PACEMAKER INSERTION CRT-P;  Surgeon: Evans Lance, MD;  Location: [facility];  Service: Cardiovascular;  Laterality: N/A;   CORONARY ARTERY BYPASS GRAFT     LEAD REVISION/REPAIR N/A 04/02/2018   Procedure: LEAD REVISION/REPAIR;  Surgeon: Evans Lance, MD;  Location: [facility];  Service: Cardiovascular;  Laterality: N/A;     reports that he quit smoking about 51 years ago. He has never used smokeless tobacco. He reports that he does not drink alcohol or use drugs.  Allergies  Allergen Reactions   Penicillins Rash    Did it involve swelling of the face/tongue/throat, SOB, or low BP? No Did it involve sudden or severe rash/hives, skin peeling, or any reaction on the inside of your mouth or nose? No Did you need to seek medical attention at a hospital or doctor's office? No When did it last happen?40 years ago If all above answers are NO, may proceed with cephalosporin use.    History reviewed. No pertinent family history.  Prior to Admission medications   Medication Sig Start Date End Date Taking? Authorizing Provider  acetaminophen (TYLENOL) 500 MG tablet Take 1,000 mg by mouth daily.   Yes [provider]  atorvastatin (LIPITOR) 20 MG tablet Take 20 mg by mouth daily at 6 PM.  08/11/16  Yes [provider]  ELIQUIS 2.5 MG TABS tablet Take 2.5 mg by mouth 2 (two) times daily.  09/08/16  Yes [provider]  finasteride (PROSCAR) 5 MG tablet Take 5 mg by mouth daily.  07/09/16  Yes [provider]  KLOR-CON M20 20 MEQ tablet Take 20 mEq by mouth daily.  09/15/16  Yes [provider]  LORazepam (ATIVAN) 0.5 MG tablet Take  0.25 mg by mouth at bedtime.   Yes [provider]  metoprolol succinate (TOPROL-XL) 50 MG 24 hr tablet Take 25 mg by mouth 2 (two) times daily. 02/02/18  Yes [provider]  senna (SENOKOT) 8.6 MG TABS tablet Take 1 tablet by mouth every 3 (three) days.   Yes [provider]  Sulfamethoxazole-Trimethoprim (SULFAMETHOXAZOLE-TMP DS PO) Take 1 tablet by mouth 2 (two) times daily.   Yes [provider]  tamsulosin (FLOMAX) 0.4 MG CAPS capsule Take 0.8 mg by mouth daily.  07/28/16  Yes [provider]  torsemide (DEMADEX) 20 MG tablet Take 20 mg by mouth daily.  08/11/16  Yes [provider]  traMADol-acetaminophen (ULTRACET) 37.5-325 MG tablet Take 1 tablet by mouth at bedtime.  02/08/18  Yes [provider]    Physical Exam: Vitals:   05/10/2018 2209 04/15/2018 2243 05/11/2018 2300 05/02/18 0126  BP:  122/64  (!) 104/55  Pulse:  70  69  Resp:    20  Temp: (!) 95.4 F (35.2 C) (!) 95.5 F (35.3 C)  (!) 97.3 F (36.3 C)  TempSrc: Rectal Rectal  Rectal  SpO2:  (!) 84% 100% 100%    Physical Exam  Constitutional: He appears distressed.  Very restless  HENT:  Head: Normocephalic.  Eyes: Right eye exhibits no discharge. Left eye exhibits no discharge.  Neck: Neck supple.  Cardiovascular: Normal rate, regular rhythm and intact distal pulses.  Pulmonary/Chest: He has no wheezes.  Equal air entry upon auscultation of anterior lung fields  Abdominal: Bowel sounds are normal. He exhibits no distension. There is no abdominal tenderness.  Musculoskeletal:        General: No edema.  Neurological:  Awake Not alert or oriented Very restless  Skin: Skin is warm and dry.     Labs on Admission: I have personally reviewed following labs and imaging studies  CBC: Recent Labs  Lab 04/29/18 0834 04/29/18 0850 04/28/2018 1506  WBC 7.8  --  12.2*  NEUTROABS 6.3  --  10.5*  HGB 11.9* 12.2* 11.6*  HCT 37.3* 36.0* 36.5*  MCV 99.2  --  99.5   PLT 214  --  160   Basic Metabolic Panel: Recent Labs  Lab 04/29/18 0834 04/29/18 0850 05/02/2018 1506  NA 140 140 137  K 4.7 4.7 6.3*  CL 111  --  109  CO2 19*  --  16*  GLUCOSE 109*  --  80  BUN 34*  --  73*  CREATININE 1.58*  --  2.91*  CALCIUM 9.1  --  8.6*   GFR: Estimated Creatinine Clearance: 10 mL/min (A) (by C-G formula based on SCr of 2.91 mg/dL (H)). Liver Function Tests: Recent Labs  Lab 04/30/2018 1506  AST 49*  ALT 30  ALKPHOS 116  BILITOT 1.3*  PROT 6.3*  ALBUMIN 3.2*   No results for input(s): LIPASE, AMYLASE in the last 168 hours. No results for input(s): AMMONIA in the last 168  hours. Coagulation Profile: Recent Labs  Lab 05/06/2018 1506  INR 1.9*   Cardiac Enzymes: No results for input(s): CKTOTAL, CKMB, CKMBINDEX, TROPONINI in the last 168 hours. BNP (last 3 results) No results for input(s): PROBNP in the last 8760 hours. HbA1C: No results for input(s): HGBA1C in the last 72 hours. CBG: Recent Labs  Lab 04/25/2018 1431 04/15/2018 1616 04/18/2018 1749  GLUCAP 62* 60* 141*   Lipid Profile: No results for input(s): CHOL, HDL, LDLCALC, TRIG, CHOLHDL, LDLDIRECT in the last 72 hours. Thyroid Function Tests: No results for input(s): TSH, T4TOTAL, FREET4, T3FREE, THYROIDAB in the last 72 hours. Anemia Panel: No results for input(s): VITAMINB12, FOLATE, FERRITIN, TIBC, IRON, RETICCTPCT in the last 72 hours. Urine analysis:    Component Value Date/Time   COLORURINE AMBER (A) 05/09/2018 1650   APPEARANCEUR HAZY (A) 04/16/2018 1650   LABSPEC 1.018 05/03/2018 1650   PHURINE 5.0 04/19/2018 1650   GLUCOSEU NEGATIVE 05/06/2018 1650   HGBUR MODERATE (A) 05/03/2018 1650   BILIRUBINUR NEGATIVE 04/13/2018 1650   KETONESUR NEGATIVE 05/12/2018 1650   PROTEINUR NEGATIVE 05/06/2018 1650   NITRITE NEGATIVE 04/23/2018 1650   LEUKOCYTESUR LARGE (A) 04/19/2018 1650    Radiological Exams on Admission: Ct Head Wo Contrast  Result Date: 05/10/2018 CLINICAL  DATA:  Altered mental status.  Recent UTI diagnosis. EXAM: CT HEAD WITHOUT CONTRAST TECHNIQUE: Contiguous axial images were obtained from the base of the skull through the vertex without intravenous contrast. COMPARISON:  CT head dated April 29, 2018. FINDINGS: Brain: No evidence of acute infarction, hemorrhage, hydrocephalus, extra-axial collection or mass lesion/mass effect. Stable atrophy and chronic microvascular ischemic changes. Vascular: Calcified atherosclerosis at the skullbase. No hyperdense vessel. Skull: Negative for fracture or focal lesion. Sinuses/Orbits: No acute finding. Other: None. IMPRESSION: 1.  No acute intracranial abnormality.  No interval change. Electronically Signed   By: Titus Dubin M.D.   On: 04/14/2018 15:48   Dg Chest Port 1 View  Result Date: 04/20/2018 CLINICAL DATA:  Loss of consciousness. EXAM: PORTABLE CHEST 1 VIEW COMPARISON:  Chest x-ray 04/29/2018. FINDINGS: AICD in stable position. Prior CABG. Cardiomegaly with diffuse bilateral pulmonary infiltrates consistent bilateral pulmonary edema. Bilateral pneumonia can not be excluded. Small bilateral pleural effusions. No pneumothorax. IMPRESSION: AICD in stable position. Prior CABG. Findings consistent with congestive heart failure bilateral pulmonary edema and small bilateral pleural effusions. Electronically Signed   By: Marcello Moores  Register   On: 04/13/2018 14:57    EKG: Independently reviewed.  Accelerated junctional rhythm, atypical of RBBB, QTC prolongation.  Assessment/Plan Principal Problem:   Severe sepsis (HCC) Active Problems:   Hypothermia   UTI (urinary tract infection)   Acute respiratory failure with hypoxia (HCC)   Pulmonary edema   Hyperkalemia   Hypoglycemia   Metabolic acidosis   AKI (acute kidney injury) ([place])   Patient is a 85 year old male presenting with severe sepsis and hypothermia secondary to UTI, acute hypoxic respiratory failure secondary to pulmonary edema, hyperkalemia,  hypoglycemia, metabolic acidosis, and AKI.  -EMS noted patient was cool to touch with poor perfusion.  Hypothermic with temperature 96.4 F on arrival and placed on bair hugger. White count 12.2.  Lactic acid 2.6 > 2.9 after 1 L fluid bolus.  Patient continued to be hypotensive despite IV fluid bolus.  UA with evidence of UTI.  Chest x-ray with findings consistent with congestive heart failure including bilateral pulmonary edema and small bilateral pleural effusions.  Hypoxic with SPO2 84% on room air. Hypoglycemic with CBG 62.  Hyperkalemic with  potassium 6.3.  Bicarb 16.  Evidence of AKI - BUN 73, creatinine 2.9 (baseline creatinine 1.4-1.5). -Patient has a poor prognosis.  ED provider had a goals of care discussion with the patient's daughter and HCPOA [name] who decided to pursue full comfort care measures.  I also personally spoke to the patient's daughter and she again expressed that she would not want the patient to suffer due to his advanced age.  Daughter again confirmed that she wants the patient to be transitioned to full comfort care measures.   -Morphine 1 mg every 2 hours as needed for severe pain or dyspnea -Ativan 1 mg every 4 hours as needed for anxiety -Atropine 1% solution as needed for excessive secretions -Supplemental oxygen for comfort  DVT prophylaxis: None Code Status: DNR and full comfort care.  Confirmed with daughter at bedside. Family Communication: Daughter at bedside. Consults called: None Admission status: Observation, MedSurg  This chart was dictated using voice recognition software.  Despite best efforts to proofread, errors can occur which can change the documentation meaning.  Shela Leff MD [facility] Pager [phone]  If 7PM-7AM, please contact night-coverage [email or web address] Password [facility]  05/02/2018, 3:46 AM

## 2018-05-02 NOTE — Progress Notes (Signed)
Patient has a pacemaker not ICD, no need to have device turned off per manufacturer representative.

## 2018-05-02 NOTE — Consult Note (Signed)
Consultation Note Date: 05/02/2018   Patient Name: Justin [name]  DOB: 07/12/1913  MRN: [id number]  Age / Sex: 85 y.o., male  PCP: Carol Ada, MD Referring Physician: Eugenie Filler, MD  Reason for Consultation: Terminal Care  HPI/Patient Profile: 85 y.o. male   admitted on 04/26/2018 with  .   Clinical Assessment and Goals of Care:  Justin [name] is a 85 y.o. male with medical history significant of hypertension, bradycardia status post pacemaker, atrial fibrillation, chronic diastolic congestive heart failure presenting to the hospital via EMS for evaluation of altered mental status.  Patient is unresponsive, and daughter is primary caregiver and HCPOA agent. They live together, in [place], [place].   The patient has been admitted to [facility] MD service with hypothermia, sepsis, deemed secondary to UTI, acute hypoxic respiratory failure secondary to pulmonary edema, hyperkalemia, hypoglycemia, metabolic acidosis, and AKI.   Patient to be under comfort measures and a palliative consult has been requested for additional terminal care measures.   The patient is unresponsive. His daughter is at the bedside, she states that the patient might need more pain medicine, she has observed, while holding vigil that the patient is moaning, wincing and grimacing at times. I introduced myself and palliative care as follows: Palliative medicine is specialized medical care for people living with serious illness. It focuses on providing relief from the symptoms and stress of a serious illness. The goal is to improve quality of life for both the patient and the family.  We reviewed end of life signs and symptoms, we reviewed end of life order set.   See below.   HCPOA  daughter at bedside  SUMMARY OF RECOMMENDATIONS    DNR DNI End of life order set in place Chart reviewed, EP notes reviewed, patient has PPM Add  Dilaudid IV PRN for pain Prognosis hours to days Anticipated hospital death  Code Status/Advance Care Planning:  DNR    Symptom Management:    as above   Palliative Prophylaxis:   Delirium Protocol  Additional Recommendations (Limitations, Scope, Preferences):  Full Comfort Care  Psycho-social/Spiritual:   Desire for further Chaplaincy support:yes  Additional Recommendations: Education on Hospice  Prognosis:   Hours - Days  Discharge Planning: Anticipated Hospital Death      Primary Diagnoses: Present on Admission:  Severe sepsis ([place])   I have reviewed the medical record, interviewed the patient and family, and examined the patient. The following aspects are pertinent.  Past Medical History:  Diagnosis Date   Hypertension    Symptomatic bradycardia    Social History   Socioeconomic History   Marital status: Married    Spouse name: Not on file   Number of children: Not on file   Years of education: Not on file   Highest education level: Not on file  Occupational History   Not on file  Social Needs   Financial resource strain: Not on file   Food insecurity:    Worry: Not on file    Inability: Not on  file   Transportation needs:    Medical: Not on file    Non-medical: Not on file  Tobacco Use   Smoking status: Former Smoker    Last attempt to quit: 09/20/1966    Years since quitting: 51.6   Smokeless tobacco: Never Used  Substance and Sexual Activity   Alcohol use: No   Drug use: No   Sexual activity: Not on file  Lifestyle   Physical activity:    Days per week: Not on file    Minutes per session: Not on file   Stress: Not on file  Relationships   Social connections:    Talks on phone: Not on file    Gets together: Not on file    Attends religious service: Not on file    Active member of club or organization: Not on file    Attends meetings of clubs or organizations: Not on file    Relationship status: Not on file    Other Topics Concern   Not on file  Social History Narrative   Not on file   History reviewed. No pertinent family history. Scheduled Meds:  sodium chloride flush  3 mL Intravenous Q12H   Continuous Infusions:  chlorproMAZINE (THORAZINE) IV     PRN Meds:.acetaminophen **OR** acetaminophen, albuterol, alum & mag hydroxide-simeth, antiseptic oral rinse, atropine, bisacodyl, chlorproMAZINE (THORAZINE) IV, diphenhydrAMINE, glycopyrrolate **OR** glycopyrrolate **OR** glycopyrrolate, haloperidol **OR** haloperidol **OR** haloperidol lactate, HYDROmorphone (DILAUDID) injection, LORazepam **OR** LORazepam **OR** LORazepam, magic mouthwash w/lidocaine, morphine injection, morphine CONCENTRATE **OR** morphine CONCENTRATE, nystatin, ondansetron **OR** ondansetron (ZOFRAN) IV, polyvinyl alcohol, traZODone Medications Prior to Admission:  Prior to Admission medications   Medication Sig Start Date End Date Taking? Authorizing Provider  acetaminophen (TYLENOL) 500 MG tablet Take 1,000 mg by mouth daily.   Yes [provider]  atorvastatin (LIPITOR) 20 MG tablet Take 20 mg by mouth daily at 6 PM.  08/11/16  Yes [provider]  ELIQUIS 2.5 MG TABS tablet Take 2.5 mg by mouth 2 (two) times daily.  09/08/16  Yes [provider]  finasteride (PROSCAR) 5 MG tablet Take 5 mg by mouth daily.  07/09/16  Yes [provider]  KLOR-CON M20 20 MEQ tablet Take 20 mEq by mouth daily.  09/15/16  Yes [provider]  LORazepam (ATIVAN) 0.5 MG tablet Take 0.25 mg by mouth at bedtime.   Yes [provider]  metoprolol succinate (TOPROL-XL) 50 MG 24 hr tablet Take 25 mg by mouth 2 (two) times daily. 02/02/18  Yes [provider]  senna (SENOKOT) 8.6 MG TABS tablet Take 1 tablet by mouth every 3 (three) days.   Yes [provider]  Sulfamethoxazole-Trimethoprim (SULFAMETHOXAZOLE-TMP DS PO) Take 1 tablet by mouth 2 (two) times daily.   Yes [provider]  tamsulosin (FLOMAX) 0.4 MG CAPS capsule Take 0.8 mg by mouth daily.  07/28/16  Yes [provider]  torsemide (DEMADEX) 20 MG tablet Take 20 mg by mouth daily.  08/11/16  Yes [provider]  traMADol-acetaminophen (ULTRACET) 37.5-325 MG tablet Take 1 tablet by mouth at bedtime.  02/08/18  Yes [provider]   Allergies  Allergen Reactions   Penicillins Rash    Did it involve swelling of the face/tongue/throat, SOB, or low BP? No Did it involve sudden or severe rash/hives, skin peeling, or any reaction on the inside of your mouth or nose? No Did you need to seek medical attention at a hospital or doctor's office? No When did it  last happen?40 years ago If all above answers are NO, may proceed with cephalosporin use.   Review of Systems Non verbal  Physical Exam Unresponsive With non verbal gestures of distress/discomfort Patient with mild wincing grimacing moaning shallow breath sounds S1 S2 No edema  Vital Signs: BP (!) 99/52 (BP Location: Left Arm)    Pulse 70    Temp (!) 97.5 F (36.4 C) (Axillary)    Resp 18    SpO2 100%  Pain Scale: PAINAD   Pain Score: Asleep   SpO2: SpO2: 100 % O2 Device:SpO2: 100 % O2 Flow Rate: .O2 Flow Rate (L/min): 2 L/min  IO: Intake/output summary:   Intake/Output Summary (Last 24 hours) at 05/02/2018 1255 Last data filed at 05/02/2018 0600 Gross per 24 hour  Intake 1098.92 ml  Output 0 ml  Net 1098.92 ml    LBM: Last BM Date: 04/30/18 Baseline Weight:   Most recent weight:       Palliative Assessment/Data:   PPS 10%  Time In:  11 Time Out:  12 Time Total:   60 Greater than 50%  of this time was spent counseling and coordinating care related to the above assessment and plan.  Signed by: Loistine Chance, MD [phone]  Please contact Palliative Medicine Team phone at [phone] for questions and concerns.  For individual provider: See Shea Evans

## 2018-05-02 NOTE — Progress Notes (Signed)
Pt's MEWS is yellow; pt is DNR and full comfort care per MD order. Will continue to monitor patient.

## 2018-05-02 NOTE — Progress Notes (Signed)
I have seen and assessed patient and agree with Dr. Elon Jester assessment and plan.  Patient is 85 year old gentleman history of hypertension, bradycardia status post PPM, paroxysmal A. fib, chronic diastolic CHF presented to the ED with acute encephalopathy.  Patient noted on admission to be hypothermic, sepsis secondary to UTI, acute hypoxic respiratory failure secondary to pulmonary edema, hypotensive, hyperkalemic with a metabolic acidosis and acute kidney injury.  Family decided on full comfort measures.  End-of-life order set placed.  Palliative care consult for symptom management.  No charge.

## 2018-05-03 DIAGNOSIS — R4182 Altered mental status, unspecified: Secondary | ICD-10-CM

## 2018-05-03 DIAGNOSIS — I5033 Acute on chronic diastolic (congestive) heart failure: Secondary | ICD-10-CM

## 2018-05-03 LAB — URINE CULTURE: Culture: NO GROWTH

## 2018-05-03 NOTE — Progress Notes (Signed)
PMT progress note  Patient is actively dying, unresponsive, appear comfortable.  Daughter holding vigil at bedside.    Brief life review performed, patient was a WWII veteran, he is described as a very caring, hard working person. I thanked the patient's daughter for the patient's service to our country.   Medication history noted, patient has required 2.5 mg IV Dilaudid in the past 24 hours.   BP (!) 110/94 (BP Location: Right Arm)   Pulse 70   Temp (!) 97.5 F (36.4 C) (Axillary)   Resp 16   SpO2 (!) 67%  Chart reviewed, labs and imaging noted.   shallow respirations Extremities still warm to touch No coolness no mottling S 1 S2 Unresponsive  PPS 10%  85 y.o.malewith medical history significant ofhypertension, bradycardia status post pacemaker, atrial fibrillation, chronic diastolic congestive heart failure presenting to the hospital via EMS for evaluation of altered mental status.  Patient is unresponsive, and daughter is primary caregiver and HCPOA agent. They live together, in [place], [place].   The patient has been admitted to [facility] MD service with hypothermia, sepsis, deemed secondary to UTI,acute hypoxic respiratory failure secondary to pulmonary edema,hyperkalemia, hypoglycemia, metabolic acidosis,andAKI.   Patient to be under comfort measures and a palliative consult has been requested for additional terminal care measures.   Plan: Continue current mode of care Anticipate hospital death Prognosis hours to some very limited number of days discussed with daughter.   25 minutes spent Loistine Chance MD [facility] health palliative medicine team [phone]

## 2018-05-03 NOTE — Progress Notes (Signed)
PROGRESS NOTE    Justin [name]  CBJ:[id number] DOB: [birth year] DOA: 04/30/2018 PCP: Carol Ada, MD   Brief Narrative:  HPI per Dr. Canary [name] is a 85 y.o. male with medical history significant of hypertension, bradycardia status post pacemaker, atrial fibrillation, chronic diastolic congestive heart failure presenting to the hospital via EMS for evaluation of altered mental status.  No history could be obtained from the patient.  Daughter states patient has been declining for the past 3 days.  He has had multiple falls.  One fall last week and 2 falls this week.  States he was taken to the emergency room earlier this week and they were told he did not have any fractures.  He was also diagnosed with a UTI recently and placed on Bactrim.  Daughter states patient is very weak and has not been sleeping.  Daughter denies any fevers, nausea, vomiting, or diarrhea.  States patient has not complained of any pain.  ED Course: EMS noted patient was cool to touch with poor perfusion.  Hypothermic with temperature 96.4 F on arrival and placed on bair hugger.  Hypotensive.  Intermittently tachycardic and tachypneic.  SPO2 84% on room air.  CBG 62.  White count 12.2.  Lactic acid 2.6 > 2.9 after 1 L fluid bolus.  Patient continued to be hypotensive.  INR 1.9.  Potassium 6.3.  Bicarb 16.  BUN 73.  Creatinine 2.9, baseline 1.4-1.5.  UA with large amount of leukocytes, greater than 50 WBCs, and rare bacteria.  Influenza panel negative.  Respiratory viral panel negative.  Head CT negative for acute finding.  Chest x-ray with findings consistent with congestive heart failure including bilateral pulmonary edema and small bilateral pleural effusions. Patient received 1 amp D50, sodium bicarb 50 mEq, Ativan 1 mg total, and ceftriaxone.   ED provider had a goals of care discussion with the patient's daughter.  Daughter wanted the patient to be transition to full comfort care.  Assessment & Plan:    Principal Problem:   Severe sepsis ([place]) Active Problems:   Hypothermia   UTI (urinary tract infection)   Acute respiratory failure with hypoxia (HCC)   Pulmonary edema   Hyperkalemia   Hypoglycemia   Metabolic acidosis   AKI (acute kidney injury) ([place])   Palliative care by specialist   Goals of care, counseling/discussion   Generalized pain  1 severe sepsis Patient presented with severe sepsis with hypothermia, leukocytosis, hypotension, acute renal failure, hyperkalemia, elevated lactic acid level with urinalysis worrisome for UTI.  CT head done was negative.  Chest x-ray negative for any acute infiltrates however consistent with CHF exacerbation.  Admitting physician and ED provider discussed goals of care discussion with patient's daughter and healthcare power of attorney, Justin [name], who has decided on full comfort measures.  End-of-life order set placed.  Patient minimally responsive.  Patient seen in consultation by palliative care.  Full comfort measures.  Patient placed on morphine for severe pain and dyspnea, Ativan as needed for anxiety, atropine and Robinul for secretions.  Supplemental oxygen for comfort.  Palliative care following and appreciate their input and recommendations.  2.  Acute renal failure/hyperkalemia Likely secondary to prerenal azotemia.  Patient also noted to be hypotensive on admission.  Patient with concerns for UTI.  Family has decided on full comfort measures.  3.  Metabolic acidosis Secondary to problem #2.  4.  Acute on chronic diastolic heart failure Patient noted to be hypotensive on admission and as such posing difficulty with  diuresis.  Family has decided on full comfort measures.  5.  Acute encephalopathy Secondary to problems #1 2 and 3 and 4.  Patient currently full comfort measures.  Patient minimally responsive.  Patient comfortable.  Patient actively dying.  6.  Prognosis Patient is 85 year old gentleman history of hypertension,  bradycardia status post PPM, A. fib, chronic diastolic failure presented to the hospital with acute encephalopathy noted to be in severe sepsis.  Patient also noted to have a UTI, hyperkalemia, hypothermia, acute renal failure, acute on chronic diastolic heart failure, metabolic acidosis.  Patient minimally responsive.  Patient actively dying.  Family have decided on full comfort measures.  Palliative care following for additional terminal care measures.  Patient likely in hospital death and prognosis is hours to limited number of days.  Palliative care following and appreciate input and recommendations.   DVT prophylaxis: Comfort care Code Status: DNR Family Communication: Updated daughter at bedside. Disposition Plan: Likely in hospital death.   Consultants:   Palliative care: Dr. [name] 05/02/2018  Procedures:   CT head 04/19/2018  Chest x-ray 04/13/2018  Antimicrobials:   None   Subjective: Resting comfortably.  Minimally responsive.  Daughter at bedside.  Objective: Vitals:   05/02/18 0126 05/02/18 0459 05/02/18 0528 05/02/18 1735  BP: (!) 104/55 (!) 87/53 (!) 99/52 (!) 110/94  Pulse: 69 70  70  Resp: 20 18  16   Temp: (!) 97.3 F (36.3 C) (!) 97.5 F (36.4 C)    TempSrc: Rectal Axillary    SpO2: 100% 100%  (!) 67%    Intake/Output Summary (Last 24 hours) at 05/03/2018 1042 Last data filed at 05/03/2018 0630 Gross per 24 hour  Intake -  Output 0 ml  Net 0 ml   There were no vitals filed for this visit.  Examination:  General exam: Resting comfortably.  Minimally responsive. Respiratory system: Bibasilar crackles.  Cardiovascular system: S1 & S2 heard, RRR. No JVD, murmurs, rubs, gallops or clicks. No pedal edema. Gastrointestinal system: Abdomen is nondistended, soft and nontender. No organomegaly or masses felt. Normal bowel sounds heard. Central nervous system: Minimally responsive. No focal neurological deficits. Extremities: Symmetric 5 x 5 power. Skin: No  rashes, lesions or ulcers Psychiatry: Unable to assess judgment and insight and mood and affect due to patient's mental status.    Data Reviewed: I have personally reviewed following labs and imaging studies  CBC: Recent Labs  Lab 04/29/18 0834 04/29/18 0850 04/28/2018 1506  WBC 7.8  --  12.2*  NEUTROABS 6.3  --  10.5*  HGB 11.9* 12.2* 11.6*  HCT 37.3* 36.0* 36.5*  MCV 99.2  --  99.5  PLT 214  --  355   Basic Metabolic Panel: Recent Labs  Lab 04/29/18 0834 04/29/18 0850 04/15/2018 1506  NA 140 140 137  K 4.7 4.7 6.3*  CL 111  --  109  CO2 19*  --  16*  GLUCOSE 109*  --  80  BUN 34*  --  73*  CREATININE 1.58*  --  2.91*  CALCIUM 9.1  --  8.6*   GFR: Estimated Creatinine Clearance: 10 mL/min (A) (by C-G formula based on SCr of 2.91 mg/dL (H)). Liver Function Tests: Recent Labs  Lab 05/05/2018 1506  AST 49*  ALT 30  ALKPHOS 116  BILITOT 1.3*  PROT 6.3*  ALBUMIN 3.2*   No results for input(s): LIPASE, AMYLASE in the last 168 hours. No results for input(s): AMMONIA in the last 168 hours. Coagulation Profile: Recent Labs  Lab  05/12/2018 1506  INR 1.9*   Cardiac Enzymes: No results for input(s): CKTOTAL, CKMB, CKMBINDEX, TROPONINI in the last 168 hours. BNP (last 3 results) No results for input(s): PROBNP in the last 8760 hours. HbA1C: No results for input(s): HGBA1C in the last 72 hours. CBG: Recent Labs  Lab 05/05/2018 1431 04/21/2018 1616 04/23/2018 1749  GLUCAP 62* 60* 141*   Lipid Profile: No results for input(s): CHOL, HDL, LDLCALC, TRIG, CHOLHDL, LDLDIRECT in the last 72 hours. Thyroid Function Tests: No results for input(s): TSH, T4TOTAL, FREET4, T3FREE, THYROIDAB in the last 72 hours. Anemia Panel: No results for input(s): VITAMINB12, FOLATE, FERRITIN, TIBC, IRON, RETICCTPCT in the last 72 hours. Sepsis Labs: Recent Labs  Lab 05/07/2018 1506 04/12/2018 1854  LATICACIDVEN 2.6* 2.9*    Recent Results (from the past 240 hour(s))  Blood Cultures  (routine x 2)     Status: None (Preliminary result)   Collection Time: 04/27/2018  3:06 PM  Result Value Ref Range Status   Specimen Description   Final    BLOOD RIGHT WRIST Performed at [facility] [address], [place], [place] [address]    Special Requests   Final    BOTTLES DRAWN AEROBIC AND ANAEROBIC Blood Culture results may not be optimal due to an inadequate volume of blood received in culture bottles Performed at [facility] [address], [place], [place] [address]    Culture   Final    NO GROWTH 2 DAYS Performed at [facility] [address], [place], [place] [address]    Report Status PENDING  Incomplete  Urine culture     Status: None   Collection Time: 04/24/2018  4:50 PM  Result Value Ref Range Status   Specimen Description   Final    URINE, CATHETERIZED Performed at [facility] [address] [place], [place] [address]    Special Requests   Final    NONE Performed at [facility], [place] [address] [place], [place] [address]    Culture   Final    NO GROWTH Performed at [facility], [place] [address], [place], [place] [address]    Report Status 05/03/2018 FINAL  Final  Blood Cultures (routine x 2)     Status: None (Preliminary result)   Collection Time: 05/06/2018  5:27 PM  Result Value Ref Range Status   Specimen Description   Final    BLOOD LEFT FOREARM Performed at [facility] [address] [place], [place] [address]    Special Requests   Final    BOTTLES DRAWN AEROBIC AND ANAEROBIC Blood Culture results may not be optimal due to an inadequate volume of blood received in culture bottles Performed at [facility] [address], [place], [place] [address]    Culture   Final    NO GROWTH 2 DAYS Performed at [facility] [address], [place], [place] [address]    Report Status PENDING  Incomplete  Respiratory  Panel by PCR     Status: None   Collection Time: 05/09/2018  6:16 PM  Result Value Ref Range Status   Adenovirus NOT DETECTED NOT DETECTED Final   Coronavirus 229E NOT DETECTED NOT DETECTED Final    Comment: (NOTE) The Coronavirus on the Respiratory Panel, DOES NOT test for the novel  Coronavirus (2019 nCoV)    Coronavirus HKU1 NOT DETECTED NOT DETECTED Final   Coronavirus NL63 NOT DETECTED  NOT DETECTED Final   Coronavirus OC43 NOT DETECTED NOT DETECTED Final   Metapneumovirus NOT DETECTED NOT DETECTED Final   Rhinovirus / Enterovirus NOT DETECTED NOT DETECTED Final   Influenza A NOT DETECTED NOT DETECTED Final   Influenza B NOT DETECTED NOT DETECTED Final   Parainfluenza Virus 1 NOT DETECTED NOT DETECTED Final   Parainfluenza Virus 2 NOT DETECTED NOT DETECTED Final   Parainfluenza Virus 3 NOT DETECTED NOT DETECTED Final   Parainfluenza Virus 4 NOT DETECTED NOT DETECTED Final   Respiratory Syncytial Virus NOT DETECTED NOT DETECTED Final   Bordetella pertussis NOT DETECTED NOT DETECTED Final   Chlamydophila pneumoniae NOT DETECTED NOT DETECTED Final   Mycoplasma pneumoniae NOT DETECTED NOT DETECTED Final    Comment: Performed at [facility], [place] [address], [place], [place] [address]         Radiology Studies: Ct Head Wo Contrast  Result Date: 04/24/2018 CLINICAL DATA:  Altered mental status.  Recent UTI diagnosis. EXAM: CT HEAD WITHOUT CONTRAST TECHNIQUE: Contiguous axial images were obtained from the base of the skull through the vertex without intravenous contrast. COMPARISON:  CT head dated April 29, 2018. FINDINGS: Brain: No evidence of acute infarction, hemorrhage, hydrocephalus, extra-axial collection or mass lesion/mass effect. Stable atrophy and chronic microvascular ischemic changes. Vascular: Calcified atherosclerosis at the skullbase. No hyperdense vessel. Skull: Negative for fracture or focal lesion. Sinuses/Orbits: No acute finding. Other: None. IMPRESSION: 1.  No  acute intracranial abnormality.  No interval change. Electronically Signed   By: Titus Dubin M.D.   On: 04/24/2018 15:48   Dg Chest Port 1 View  Result Date: 05/02/2018 CLINICAL DATA:  Loss of consciousness. EXAM: PORTABLE CHEST 1 VIEW COMPARISON:  Chest x-ray 04/29/2018. FINDINGS: AICD in stable position. Prior CABG. Cardiomegaly with diffuse bilateral pulmonary infiltrates consistent bilateral pulmonary edema. Bilateral pneumonia can not be excluded. Small bilateral pleural effusions. No pneumothorax. IMPRESSION: AICD in stable position. Prior CABG. Findings consistent with congestive heart failure bilateral pulmonary edema and small bilateral pleural effusions. Electronically Signed   By: Marcello Moores  Register   On: 05/10/2018 14:57        Scheduled Meds: . sodium chloride flush  3 mL Intravenous Q12H   Continuous Infusions: . chlorproMAZINE (THORAZINE) IV       LOS: 1 day    Time spent: 35 minutes    Irine Seal, MD [facility]  If 7PM-7AM, please contact night-coverage [email or web address] 05/03/2018, 10:42 AM

## 2018-05-04 NOTE — Progress Notes (Signed)
PROGRESS NOTE    Justin [name]  ZOX:[id number] DOB: 10-15-1913 DOA: 04/17/2018 PCP: Justin Ada, MD   Brief Narrative:  HPI per Dr. Canary [name] is a 85 y.o. male with medical history significant of hypertension, bradycardia status post pacemaker, atrial fibrillation, chronic diastolic congestive heart failure presenting to the hospital via EMS for evaluation of altered mental status.  No history could be obtained from the patient.  Daughter states patient has been declining for the past 3 days.  He has had multiple falls.  One fall last week and 2 falls this week.  States he was taken to the emergency room earlier this week and they were told he did not have any fractures.  He was also diagnosed with a UTI recently and placed on Bactrim.  Daughter states patient is very weak and has not been sleeping.  Daughter denies any fevers, nausea, vomiting, or diarrhea.  States patient has not complained of any pain.  ED Course: EMS noted patient was cool to touch with poor perfusion.  Hypothermic with temperature 96.4 F on arrival and placed on bair hugger.  Hypotensive.  Intermittently tachycardic and tachypneic.  SPO2 84% on room air.  CBG 62.  White count 12.2.  Lactic acid 2.6 > 2.9 after 1 L fluid bolus.  Patient continued to be hypotensive.  INR 1.9.  Potassium 6.3.  Bicarb 16.  BUN 73.  Creatinine 2.9, baseline 1.4-1.5.  UA with large amount of leukocytes, greater than 50 WBCs, and rare bacteria.  Influenza panel negative.  Respiratory viral panel negative.  Head CT negative for acute finding.  Chest x-ray with findings consistent with congestive heart failure including bilateral pulmonary edema and small bilateral pleural effusions. Patient received 1 amp D50, sodium bicarb 50 mEq, Ativan 1 mg total, and ceftriaxone.   ED provider had a goals of care discussion with the patient's daughter.  Daughter wanted the patient to be transition to full comfort care.  Assessment & Plan:     Principal Problem:   Severe sepsis ([place]) Active Problems:   Hypothermia   UTI (urinary tract infection)   Acute respiratory failure with hypoxia (HCC)   Pulmonary edema   Hyperkalemia   Hypoglycemia   Metabolic acidosis   AKI (acute kidney injury) ([place])   Palliative care by specialist   Goals of care, counseling/discussion   Generalized pain   Altered mental status   Acute on chronic diastolic CHF (congestive heart failure) ([name])  1 severe sepsis Patient presented with severe sepsis with hypothermia, leukocytosis, hypotension, acute renal failure, hyperkalemia, elevated lactic acid level with urinalysis worrisome for UTI.  CT head done was negative.  Chest x-ray negative for any acute infiltrates however consistent with CHF exacerbation.  Admitting physician and ED provider discussed goals of care discussion with patient's daughter and healthcare power of attorney, Justin [name], who has decided on full comfort measures.  End-of-life order set placed.  Patient minimally responsive.  Patient seen in consultation by palliative care.  Full comfort measures.  Patient placed on IV dilaudid for severe pain and dyspnea, Ativan as needed for anxiety, atropine and Robinul for secretions.  Supplemental oxygen for comfort.  Palliative care following and appreciate their input and recommendations.  2.  Acute renal failure/hyperkalemia Likely secondary to prerenal azotemia.  Patient also noted to be hypotensive on admission.  Patient with concerns for UTI.  Family has decided on full comfort measures.  3.  Metabolic acidosis Secondary to problem #2.  4.  Acute  on chronic diastolic heart failure Patient noted to be hypotensive on admission and as such posing difficulty with diuresis.  Family has decided on full comfort measures.  5.  Acute encephalopathy Secondary to problems #1 2 and 3 and 4.  Patient currently full comfort measures.  Patient minimally responsive.  Patient comfortable.  Patient  actively dying.  6.  Prognosis Patient is 85 year old gentleman history of hypertension, bradycardia status post PPM, A. fib, chronic diastolic failure presented to the hospital with acute encephalopathy noted to be in severe sepsis.  Patient also noted to have a UTI, hyperkalemia, hypothermia, acute renal failure, acute on chronic diastolic heart failure, metabolic acidosis.  Patient minimally responsive.  Patient actively dying.  Family have decided on full comfort measures.  Palliative care following for additional terminal care measures.  Patient likely in hospital death and prognosis is hours to limited number of days.  Palliative care following and appreciate input and recommendations.   DVT prophylaxis: Comfort care Code Status: DNR Family Communication: Updated daughter at bedside. Disposition Plan: Likely in hospital death.   Consultants:   Palliative care: Dr. Rowe [name] 05/02/2018  Procedures:   CT head 04/22/2018  Chest x-ray 04/30/2018  Antimicrobials:   None   Subjective: Resting comfortably.  Minimally responsive, agonal breaths. Daughter at bedside.  Objective: Vitals:   05/02/18 0126 05/02/18 0459 05/02/18 0528 05/02/18 1735  BP: (!) 104/55 (!) 87/53 (!) 99/52 (!) 110/94  Pulse: 69 70  70  Resp: 20 18  16   Temp: (!) 97.3 F (36.3 C) (!) 97.5 F (36.4 C)    TempSrc: Rectal Axillary    SpO2: 100% 100%  (!) 67%    Intake/Output Summary (Last 24 hours) at 05/04/2018 1017 Last data filed at 05/04/2018 0600 Gross per 24 hour  Intake 0 ml  Output --  Net 0 ml   There were no vitals filed for this visit.  Examination:  General exam: Minimally responsive.  Comfortable.  Agonal breaths. Respiratory system: Agonal breaths.  Bibasilar crackles.  Cardiovascular system: Regular rate and rhythm no murmurs rubs or gallops.  No JVD.  No lower extremity edema.  Gastrointestinal system: Abdomen is soft, nontender, nondistended, positive bowel sounds.  No rebound.  No  guarding. Central nervous system: Minimally responsive. No focal neurological deficits. Extremities: Symmetric 5 x 5 power. Skin: No rashes, lesions or ulcers Psychiatry: Unable to assess judgment and insight and mood and affect due to patient's mental status.    Data Reviewed: I have personally reviewed following labs and imaging studies  CBC: Recent Labs  Lab 04/29/18 0834 04/29/18 0850 04/30/2018 1506  WBC 7.8  --  12.2*  NEUTROABS 6.3  --  10.5*  HGB 11.9* 12.2* 11.6*  HCT 37.3* 36.0* 36.5*  MCV 99.2  --  99.5  PLT 214  --  782   Basic Metabolic Panel: Recent Labs  Lab 04/29/18 0834 04/29/18 0850 04/12/2018 1506  NA 140 140 137  K 4.7 4.7 6.3*  CL 111  --  109  CO2 19*  --  16*  GLUCOSE 109*  --  80  BUN 34*  --  73*  CREATININE 1.58*  --  2.91*  CALCIUM 9.1  --  8.6*   GFR: Estimated Creatinine Clearance: 10 mL/min (A) (by C-G formula based on SCr of 2.91 mg/dL (H)). Liver Function Tests: Recent Labs  Lab 04/30/2018 1506  AST 49*  ALT 30  ALKPHOS 116  BILITOT 1.3*  PROT 6.3*  ALBUMIN 3.2*   No  results for input(s): LIPASE, AMYLASE in the last 168 hours. No results for input(s): AMMONIA in the last 168 hours. Coagulation Profile: Recent Labs  Lab 05/07/2018 1506  INR 1.9*   Cardiac Enzymes: No results for input(s): CKTOTAL, CKMB, CKMBINDEX, TROPONINI in the last 168 hours. BNP (last 3 results) No results for input(s): PROBNP in the last 8760 hours. HbA1C: No results for input(s): HGBA1C in the last 72 hours. CBG: Recent Labs  Lab 04/29/2018 1431 04/24/2018 1616 04/23/2018 1749  GLUCAP 62* 60* 141*   Lipid Profile: No results for input(s): CHOL, HDL, LDLCALC, TRIG, CHOLHDL, LDLDIRECT in the last 72 hours. Thyroid Function Tests: No results for input(s): TSH, T4TOTAL, FREET4, T3FREE, THYROIDAB in the last 72 hours. Anemia Panel: No results for input(s): VITAMINB12, FOLATE, FERRITIN, TIBC, IRON, RETICCTPCT in the last 72 hours. Sepsis Labs: Recent  Labs  Lab 04/23/2018 1506 04/19/2018 1854  LATICACIDVEN 2.6* 2.9*    Recent Results (from the past 240 hour(s))  Blood Cultures (routine x 2)     Status: None (Preliminary result)   Collection Time: 05/10/2018  3:06 PM  Result Value Ref Range Status   Specimen Description   Final    BLOOD RIGHT WRIST Performed at [facility] [address] [place], [place] [address]    Special Requests   Final    BOTTLES DRAWN AEROBIC AND ANAEROBIC Blood Culture results may not be optimal due to an inadequate volume of blood received in culture bottles Performed at [facility] [address], [place], [place] [address]    Culture   Final    NO GROWTH 2 DAYS Performed at [facility] [address] [place], [place] [address]    Report Status PENDING  Incomplete  Urine culture     Status: None   Collection Time: 05/02/2018  4:50 PM  Result Value Ref Range Status   Specimen Description   Final    URINE, CATHETERIZED Performed at [facility] [address], [place], [place] [address]    Special Requests   Final    NONE Performed at [facility], [place] [address], [place], [place] [address]    Culture   Final    NO GROWTH Performed at [facility], [place] [address], [place], [place] [address]    Report Status 05/03/2018 FINAL  Final  Blood Cultures (routine x 2)     Status: None (Preliminary result)   Collection Time: 05/03/2018  5:27 PM  Result Value Ref Range Status   Specimen Description   Final    BLOOD LEFT FOREARM Performed at [facility] [address] [place], [place] [address]    Special Requests   Final    BOTTLES DRAWN AEROBIC AND ANAEROBIC Blood Culture results may not be optimal due to an inadequate volume of blood received in culture bottles Performed at [facility] [address] [place], [place] [address]    Culture   Final    NO GROWTH 2  DAYS Performed at [facility] [address], [place], [place] [address]    Report Status PENDING  Incomplete  Respiratory Panel by PCR     Status: None   Collection Time: 04/22/2018  6:16 PM  Result Value Ref Range Status   Adenovirus NOT DETECTED NOT DETECTED Final   Coronavirus 229E NOT DETECTED NOT DETECTED Final    Comment: (NOTE) The Coronavirus on the Respiratory Panel,  DOES NOT test for the novel  Coronavirus (2019 nCoV)    Coronavirus HKU1 NOT DETECTED NOT DETECTED Final   Coronavirus NL63 NOT DETECTED NOT DETECTED Final   Coronavirus OC43 NOT DETECTED NOT DETECTED Final   Metapneumovirus NOT DETECTED NOT DETECTED Final   Rhinovirus / Enterovirus NOT DETECTED NOT DETECTED Final   Influenza A NOT DETECTED NOT DETECTED Final   Influenza B NOT DETECTED NOT DETECTED Final   Parainfluenza Virus 1 NOT DETECTED NOT DETECTED Final   Parainfluenza Virus 2 NOT DETECTED NOT DETECTED Final   Parainfluenza Virus 3 NOT DETECTED NOT DETECTED Final   Parainfluenza Virus 4 NOT DETECTED NOT DETECTED Final   Respiratory Syncytial Virus NOT DETECTED NOT DETECTED Final   Bordetella pertussis NOT DETECTED NOT DETECTED Final   Chlamydophila pneumoniae NOT DETECTED NOT DETECTED Final   Mycoplasma pneumoniae NOT DETECTED NOT DETECTED Final    Comment: Performed at [facility], [place] [address] [place], [place] [address]         Radiology Studies: No results found.      Scheduled Meds:  sodium chloride flush  3 mL Intravenous Q12H   Continuous Infusions:  chlorproMAZINE (THORAZINE) IV       LOS: 2 days    Time spent: 35 minutes    Irine Seal, MD [facility]  If 7PM-7AM, please contact night-coverage [email or web address] 05/04/2018, 10:17 AM

## 2018-05-04 NOTE — Progress Notes (Signed)
PMT no charge note  Comfort measures at end of life remain in effect, chart reviewed.   Patient has required 3 mg Dilaudid IV PRN in the past 24 hours.   Continue current mode of care.  No additional PMT specific recommendations at this time.   Anticipate hospital death.  Prognosis: some very limited number of hours to some very limited number of days.   Loistine Chance MD [facility] team [phone]

## 2018-05-05 NOTE — Care Management Important Message (Signed)
Important Message  Patient Details  Name: Justin [name] MRN: [id number] Date of Birth: 1914/01/09   Medicare Important Message Given:  Yes    Kerin Salen 05/05/2018, 12:05 [place] Message  Patient Details  Name: Justin [name] MRN: [id number] Date of Birth: [birth year]   Medicare Important Message Given:  Yes    Kerin Salen 05/05/2018, 12:05 PM

## 2018-05-05 NOTE — Progress Notes (Signed)
PROGRESS NOTE    Justin [name]  NOB:[id number] DOB: 05/03/1913 DOA: 05/10/2018 PCP: Carol Ada, MD   Brief Narrative:  HPI per Dr. Canary [name] is a 85 y.o. male with medical history significant of hypertension, bradycardia status post pacemaker, atrial fibrillation, chronic diastolic congestive heart failure presenting to the hospital via EMS for evaluation of altered mental status.  No history could be obtained from the patient.  Daughter states patient has been declining for the past 3 days.  He has had multiple falls.  One fall last week and 2 falls this week.  States he was taken to the emergency room earlier this week and they were told he did not have any fractures.  He was also diagnosed with a UTI recently and placed on Bactrim.  Daughter states patient is very weak and has not been sleeping.  Daughter denies any fevers, nausea, vomiting, or diarrhea.  States patient has not complained of any pain.  ED Course: EMS noted patient was cool to touch with poor perfusion.  Hypothermic with temperature 96.4 F on arrival and placed on bair hugger.  Hypotensive.  Intermittently tachycardic and tachypneic.  SPO2 84% on room air.  CBG 62.  White count 12.2.  Lactic acid 2.6 > 2.9 after 1 L fluid bolus.  Patient continued to be hypotensive.  INR 1.9.  Potassium 6.3.  Bicarb 16.  BUN 73.  Creatinine 2.9, baseline 1.4-1.5.  UA with large amount of leukocytes, greater than 50 WBCs, and rare bacteria.  Influenza panel negative.  Respiratory viral panel negative.  Head CT negative for acute finding.  Chest x-ray with findings consistent with congestive heart failure including bilateral pulmonary edema and small bilateral pleural effusions. Patient received 1 amp D50, sodium bicarb 50 mEq, Ativan 1 mg total, and ceftriaxone.   ED provider had a goals of care discussion with the patient's daughter.  Daughter wanted the patient to be transition to full comfort care.  Assessment & Plan:    Principal Problem:   Severe sepsis ([place]) Active Problems:   Hypothermia   UTI (urinary tract infection)   Acute respiratory failure with hypoxia (HCC)   Pulmonary edema   Hyperkalemia   Hypoglycemia   Metabolic acidosis   AKI (acute kidney injury) ([place])   Palliative care by specialist   Goals of care, counseling/discussion   Generalized pain   Altered mental status   Acute on chronic diastolic CHF (congestive heart failure) ([place])  1 severe sepsis Patient presented with severe sepsis with hypothermia, leukocytosis, hypotension, acute renal failure, hyperkalemia, elevated lactic acid level with urinalysis worrisome for UTI.  CT head done was negative.  Chest x-ray negative for any acute infiltrates however consistent with CHF exacerbation.  Admitting physician and ED provider discussed goals of care discussion with patient's daughter and healthcare power of attorney, [name], who has decided on full comfort measures.  End-of-life order set placed.  Patient minimally responsive.  Patient seen in consultation by palliative care.  Full comfort measures.  Patient placed on IV dilaudid for severe pain and dyspnea, Ativan as needed for anxiety, atropine and Robinul for secretions.  Supplemental oxygen for comfort.  Palliative care following and appreciate their input and recommendations.  2.  Acute renal failure/hyperkalemia Likely secondary to prerenal azotemia.  Patient also noted to be hypotensive on admission.  Patient with concerns for UTI.  Family has decided on full comfort measures.  3.  Metabolic acidosis Secondary to problem #2.  4.  Acute on  chronic diastolic heart failure Patient noted to be hypotensive on admission and as such posing difficulty with diuresis.  Family has decided on full comfort measures.  5.  Acute encephalopathy Secondary to problems #1 2 and 3 and 4.  Patient currently full comfort measures.  Patient minimally responsive.  Patient actively dying.  6.   Prognosis Patient is 85 year old gentleman history of hypertension, bradycardia status post PPM, A. fib, chronic diastolic failure presented to the hospital with acute encephalopathy noted to be in severe sepsis.  Patient also noted to have a UTI, hyperkalemia, hypothermia, acute renal failure, acute on chronic diastolic heart failure, metabolic acidosis.  Patient minimally responsive.  Patient actively dying.  Family have decided on full comfort measures.  Palliative care following for additional terminal care measures.  Patient likely in hospital death and prognosis is hours to limited number of days.  Palliative care following and appreciate input and recommendations.   DVT prophylaxis: Comfort care Code Status: DNR Family Communication: Updated daughter at bedside. Disposition Plan: Likely in hospital death.   Consultants:   Palliative care: Dr. [name] 05/02/2018  Procedures:   CT head 04/20/2018  Chest x-ray 04/16/2018  Antimicrobials:   None   Subjective: Resting comfortably.  Agonal breaths.  Minimally responsive.  Daughter holding vigil at bedside.   Objective: Vitals:   05/02/18 0459 05/02/18 0528 05/02/18 1735 05/05/18 0538  BP: (!) 87/53 (!) 99/52 (!) 110/94 131/75  Pulse: 70  70 70  Resp: 18  16 20   Temp: (!) 97.5 F (36.4 C)   97.8 F (36.6 C)  TempSrc: Axillary   Oral  SpO2: 100%  (!) 67% 100%    Intake/Output Summary (Last 24 hours) at 05/05/2018 1151 Last data filed at 05/05/2018 0600 Gross per 24 hour  Intake 0 ml  Output -  Net 0 ml   There were no vitals filed for this visit.  Examination:  General exam: Minimally responsive.  Comfortable.  Agonal breaths. Respiratory system: Agonal breaths.  Some bibasilar crackles.  Cardiovascular system: RRR no murmurs rubs or gallops.  No JVD.  No lower extremity edema.  Gastrointestinal system: Abdomen is nontender, nondistended, soft, positive bowel sounds.  No rebound.  No guarding. Central nervous system:  Minimally responsive. No focal neurological deficits. Extremities: Symmetric 5 x 5 power. Skin: No rashes, lesions or ulcers Psychiatry: Unable to assess judgment and insight and mood and affect due to patient's mental status.    Data Reviewed: I have personally reviewed following labs and imaging studies  CBC: Recent Labs  Lab 04/29/18 0834 04/29/18 0850 04/27/2018 1506  WBC 7.8  --  12.2*  NEUTROABS 6.3  --  10.5*  HGB 11.9* 12.2* 11.6*  HCT 37.3* 36.0* 36.5*  MCV 99.2  --  99.5  PLT 214  --  676   Basic Metabolic Panel: Recent Labs  Lab 04/29/18 0834 04/29/18 0850 04/29/2018 1506  NA 140 140 137  K 4.7 4.7 6.3*  CL 111  --  109  CO2 19*  --  16*  GLUCOSE 109*  --  80  BUN 34*  --  73*  CREATININE 1.58*  --  2.91*  CALCIUM 9.1  --  8.6*   GFR: Estimated Creatinine Clearance: 10 mL/min (A) (by C-G formula based on SCr of 2.91 mg/dL (H)). Liver Function Tests: Recent Labs  Lab 05/04/2018 1506  AST 49*  ALT 30  ALKPHOS 116  BILITOT 1.3*  PROT 6.3*  ALBUMIN 3.2*   No results for input(s):  LIPASE, AMYLASE in the last 168 hours. No results for input(s): AMMONIA in the last 168 hours. Coagulation Profile: Recent Labs  Lab 04/30/2018 1506  INR 1.9*   Cardiac Enzymes: No results for input(s): CKTOTAL, CKMB, CKMBINDEX, TROPONINI in the last 168 hours. BNP (last 3 results) No results for input(s): PROBNP in the last 8760 hours. HbA1C: No results for input(s): HGBA1C in the last 72 hours. CBG: Recent Labs  Lab 04/15/2018 1431 04/16/2018 1616 04/14/2018 1749  GLUCAP 62* 60* 141*   Lipid Profile: No results for input(s): CHOL, HDL, LDLCALC, TRIG, CHOLHDL, LDLDIRECT in the last 72 hours. Thyroid Function Tests: No results for input(s): TSH, T4TOTAL, FREET4, T3FREE, THYROIDAB in the last 72 hours. Anemia Panel: No results for input(s): VITAMINB12, FOLATE, FERRITIN, TIBC, IRON, RETICCTPCT in the last 72 hours. Sepsis Labs: Recent Labs  Lab 04/30/2018 1506 04/24/2018  1854  LATICACIDVEN 2.6* 2.9*    Recent Results (from the past 240 hour(s))  Blood Cultures (routine x 2)     Status: None (Preliminary result)   Collection Time: 05/10/2018  3:06 PM  Result Value Ref Range Status   Specimen Description   Final    BLOOD RIGHT WRIST Performed at [facility] [address], [place], [place] [address]    Special Requests   Final    BOTTLES DRAWN AEROBIC AND ANAEROBIC Blood Culture results may not be optimal due to an inadequate volume of blood received in culture bottles Performed at [facility] [address], [place], [place] [address]    Culture   Final    NO GROWTH 3 DAYS Performed at [facility], [place] [address], [place], [place] [address]    Report Status PENDING  Incomplete  Urine culture     Status: None   Collection Time: 05/04/2018  4:50 PM  Result Value Ref Range Status   Specimen Description   Final    URINE, CATHETERIZED Performed at [facility] [address] [place], [place] [address]    Special Requests   Final    NONE Performed at [facility], [place] [address], [place], [place] [address]    Culture   Final    NO GROWTH Performed at [facility], [place] [address] [place], [place] [address]    Report Status 05/03/2018 FINAL  Final  Blood Cultures (routine x 2)     Status: None (Preliminary result)   Collection Time: 04/26/2018  5:27 PM  Result Value Ref Range Status   Specimen Description   Final    BLOOD LEFT FOREARM Performed at  [address] [place], [place] [address]    Special Requests   Final    BOTTLES DRAWN AEROBIC AND ANAEROBIC Blood Culture results may not be optimal due to an inadequate volume of blood received in culture bottles Performed at [facility] [address], [place], [place] [address]    Culture   Final    NO GROWTH 3 DAYS Performed at [facility], [place] [address], [place], [place] [address]    Report Status PENDING  Incomplete  Respiratory Panel by PCR     Status: None   Collection Time: 04/19/2018  6:16 PM  Result Value Ref Range Status   Adenovirus NOT DETECTED NOT DETECTED Final   Coronavirus 229E NOT DETECTED NOT DETECTED Final    Comment: (NOTE) The Coronavirus on the Respiratory Panel, DOES NOT test  for the novel  Coronavirus (2019 nCoV)    Coronavirus HKU1 NOT DETECTED NOT DETECTED Final   Coronavirus NL63 NOT DETECTED NOT DETECTED Final   Coronavirus OC43 NOT DETECTED NOT DETECTED Final   Metapneumovirus NOT DETECTED NOT DETECTED Final   Rhinovirus / Enterovirus NOT DETECTED NOT DETECTED Final   Influenza A NOT DETECTED NOT DETECTED Final   Influenza B NOT DETECTED NOT DETECTED Final   Parainfluenza Virus 1 NOT DETECTED NOT DETECTED Final   Parainfluenza Virus 2 NOT DETECTED NOT DETECTED Final   Parainfluenza Virus 3 NOT DETECTED NOT DETECTED Final   Parainfluenza Virus 4 NOT DETECTED NOT DETECTED Final   Respiratory Syncytial Virus NOT DETECTED NOT DETECTED Final   Bordetella pertussis NOT DETECTED NOT DETECTED Final   Chlamydophila pneumoniae NOT DETECTED NOT DETECTED Final   Mycoplasma pneumoniae NOT DETECTED NOT DETECTED Final    Comment: Performed at [facility], [place] [address] [place], [place] [address]         Radiology Studies: No results found.      Scheduled Meds: . sodium chloride flush  3 mL Intravenous Q12H   Continuous Infusions: . chlorproMAZINE (THORAZINE) IV       LOS: 3 days    Time spent: 35 minutes    Irine Seal, MD [facility]  If 7PM-7AM, please contact night-coverage [email or web address] 05/05/2018, 11:51 AM

## 2018-05-06 LAB — CULTURE, BLOOD (ROUTINE X 2)
Culture: NO GROWTH
Culture: NO GROWTH

## 2018-05-06 NOTE — Progress Notes (Signed)
**Note De-Identified vi Obfusction** PROGRESS NOTE    Justin [name]  WRU:[id number] DOB: November 18, 1913 DOA: 05/07/2018 PCP: Crol Ad, MD   Brief Nrrtive:  HPI per Dr. Dnise Min [name]  [phone] y.o.mlewith medicl history significnt ofhypertension, brdycrdi sttus post pcemker, tril fibrilltion, chronic distolic congestive hert filure presenting to the hospitl vi EMS for evlution of ltered mentl sttus. No history could be obtined from the ptient. Dughter sttes ptient hs been declining for the pst 3 dys. He hs hd multiple flls. One fll lst week nd 2 flls this week. Sttes he ws tken to the emergency room erlier this week nd they were told he did not hve ny frctures. He ws lso dignosed with  UTI recently nd plced on Bctrim. Dughter sttes ptient is very wek nd hs not been sleeping. Dughter denies ny fevers, nuse, vomiting, or dirrhe. Sttes ptient hs not complined of ny pin.  ED Course:EMS noted ptient ws cool to touch with poor perfusion. Hypothermic with temperture 96.4 F on rrivl ndplced Fluor Corportion. Hypotensive. Intermittently tchycrdic nd tchypneic. SPO2 84% on room ir. CBG 62. White count 12.2. Lctic cid 2.6 >2.9 fter 1 L fluid bolus. Ptient continued to be hypotensive. INR 1.9. Potssium 6.3. Bicrb 16. BUN 73. Cretinine 2.9, bseline 1.4-1.5. UA with lrge mount of leukocytes, greter thn 50 WBCs, nd rre bcteri. Influenz pnel negtive. Respirtory virl pnel negtive. Hed CT negtive for cute finding. Chest x-ry with findings consistent with congestive hert filure including bilterl pulmonry edem nd smll bilterl pleurl effusions. Ptient received 1 mp D50, sodium bicrb 50 mEq, Ativn 1 mg totl, nd ceftrixone.  ED provider hd  gols of cre discussion with the ptient's dughter.Dughter wnted the ptient to be trnsition to full comfort cre.   Assessment & Pln:    Principl Problem:   Severe sepsis ([place]) Active Problems:   Hypothermi   UTI (urinry trct infection)   Acute respirtory filure with hypoxi (HCC)   Pulmonry edem   Hyperklemi   Hypoglycemi   Metbolic cidosis   AKI (cute kidney injury) ([place])   Pllitive cre by specilist   Gols of cre, counseling/discussion   Generlized pin   Altered mentl sttus   Acute on chronic distolic CHF (congestive hert filure) ([place])   1 severe sepsis Ptient presented with severe sepsis with hypothermi, leukocytosis, hypotension, cute renl filure, hyperklemi, elevted lctic cid level with urinlysis worrisome for UTI.  CT hed done ws negtive.  Chest x-ry negtive for ny cute infiltrtes however consistent with CHF excerbtion.  Admitting physicin nd ED provider discussed gols of cre discussion with ptient's dughter nd helthcre power of ttorney, Dorice Lms, who hs decided on full comfort mesures.  End-of-life order set plced.  Ptient minimlly responsive.  Ptient seen in consulttion by pllitive cre.  Full comfort mesures.  Ptient plced on IV diludid for severe pin nd dyspne, Ativn s needed for nxiety, tropine nd Robinul for secretions.  Supplementl oxygen for comfort.  Pllitive cre following nd pprecite their input nd recommendtions.  Discussed with dughter tody.  Continue comfort mesures.    2.  Acute renl filure/hyperklemi Likely secondry to prerenl zotemi.  Ptient lso noted to be hypotensive on dmission.  Ptient with concerns for UTI.  Fmily hs decided on full comfort mesures.  3.  Metbolic cidosis Secondry to problem #2.  4.  Acute on chronic distolic hert filure Ptient noted to be hypotensive on dmission nd s such posing difficulty with diuresis.  Fmily hs decided on full comfort **Note De-Identified vi Obfusction** mesures.  5.  cute encephlopthy Secondry to problems #1 2 nd 3 nd 4.  Ptient currently full comfort  mesures.  Ptient minimlly responsive.  Ptient ctively dying.  6.  Prognosis Ptient is 85 yer old gentlemn history of hypertension, brdycrdi sttus post PPM, . fib, chronic distolic filure presented to the hospitl with cute encephlopthy noted to be in severe sepsis.  Ptient lso noted to hve  UTI, hyperklemi, hypothermi, cute renl filure, cute on chronic distolic hert filure, metbolic cidosis.  Ptient minimlly responsive.  Ptient ctively dying.  Fmily hve decided on full comfort mesures.  Pllitive cre following for dditionl terminl cre mesures.  Ptient likely in hospitl deth nd prognosis is hours to limited number of dys.  Pllitive cre following nd pprecite input nd recommendtions.   DVT prophylxis: comfort cre Code Sttus: DNR Fmily Communiction: dughter t bedside Disposition Pln: expect in hospitl deth   Consultnts:   pllitive  Procedures:  none  ntimicrobils:  nti-infectives (From dmission, onwrd)   Strt     Dose/Rte Route Frequency Ordered Stop   04/19/2018 1600  cefTRIXone (ROCEPHIN) 1 g in sodium chloride 0.9 % 100 mL IVPB  Sttus:  Discontinued     1 g 200 mL/hr over 30 Minutes Intrvenous Every 24 hours 05/11/2018 1552 04/17/2018 2042     Subjective: Nonverbl. Dughter notes pt doing well prior to this.  Ws reltively independent.  Lived with her.  Denies dementi.  Objective: Vitls:   05/02/18 0459 05/02/18 0528 05/02/18 1735 05/05/18 0538  BP: (!) 87/53 (!) 99/52 (!) 110/94 131/75  Pulse: 70  70 70  Resp: 18  16 20   Temp:    97.8 F (36.6 C)  TempSrc: xillry   Orl  SpO2: 100%  (!) 67% 100%    Intke/Output Summry (Lst 24 hours) t 05/06/2018 1446 Lst dt filed t 05/06/2018 1300 Gross per 24 hour  Intke 0 ml  Output 0 ml  Net 0 ml   There were no vitls filed for this visit.  Exmintion:  Generl: No cute distress. Crdiovsculr: Hert sounds show  regulr rte,  nd rhythm. Lungs: Cler to usculttion bilterlly  bdomen: Soft, nontender, nondistended  Neurologicl: Nonverbl t this time.  Does not wken to voice.   Skin: Wrm nd dry. No rshes or lesions. Extremities: No clubbing or cynosis. No edem.     Dt Reviewed: I hve personlly reviewed following lbs nd imging studies  CBC: Recent Lbs  Lb 05/02/2018 1506  WBC 12.2*  NEUTROBS 10.5*  HGB 11.6*  HCT 36.5*  MCV 99.5  PLT 381   Bsic Metbolic Pnel: Recent Lbs  Lb 04/20/2018 1506  N 137  K 6.3*  CL 109  CO2 16*  GLUCOSE 80  BUN 73*  CRETININE 2.91*  CLCIUM 8.6*   GFR: Estimted Cretinine Clernce: 10 mL/min () (by C-G formul bsed on SCr of 2.91 mg/dL (H)). Liver Function Tests: Recent Lbs  Lb 05/03/2018 1506  ST 49*  LT 30  LKPHOS 116  BILITOT 1.3*  PROT 6.3*  LBUMIN 3.2*   No results for input(s): LIPSE, MYLSE in the lst 168 hours. No results for input(s): MMONI in the lst 168 hours. Cogultion Profile: Recent Lbs  Lb 04/22/2018 1506  INR 1.9*   Crdic Enzymes: No results for input(s): CKTOTL, CKMB, CKMBINDEX, TROPONINI in the lst 168 hours. BNP (lst 3 results) No results for input(s): PROBNP in the lst 8760 hours. Hb1C: No results for input(s): HGB1C in the lst 72 hours.  CBG: Recent Labs  Lab 04/21/2018 1431 05/05/2018 1616 04/16/2018 1749  GLUCP 62* 60* 141*   Lipid Profile: No results for input(s): CHOL, HDL, LDLCLC, TRIG, CHOLHDL, LDLDIRECT in the last 72 hours. Thyroid Function Tests: No results for input(s): TSH, T4TOTL, FREET4, T3FREE, THYROIDB in the last 72 hours. nemia Panel: No results for input(s): VITMINB12, FOLTE, FERRITIN, TIBC, IRON, RETICCTPCT in the last 72 hours. Sepsis Labs: Recent Labs  Lab 04/29/2018 1506 04/23/2018 1854  LTICCIDVEN 2.6* 2.9*    Recent Results (from the past 240 hour(s))  Blood Cultures (routine x 2)     Status: None   Collection Time: 04/23/2018  3:06 PM   Result Value Ref Range Status   Specimen Description   Final    BLOOD RIGHT WRIST Performed at [facility] [address] [place], [place] [address]    Special Requests   Final    BOTTLES DRWN EROBIC ND NEROBIC Blood Culture results may not be optimal due to an inadequate volume of blood received in culture bottles Performed at [facility] [address], [place], [place] [address]    Culture   Final    NO GROWTH 5 DYS Performed at [facility], [place] [address], [place], [place] [address]    Report Status 05/06/2018 FINL  Final  Urine culture     Status: None   Collection Time: 04/21/2018  4:50 PM  Result Value Ref Range Status   Specimen Description   Final    URINE, CTHETERIZED Performed at [facility], [place] [address], [place], [place] [address]    Special Requests   Final    NONE Performed at [facility], [place] [address] [place], [place] [address]    Culture   Final    NO GROWTH Performed at [facility], [place] [address], [place], [place] [address]    Report Status 05/03/2018 FINL  Final  Blood Cultures (routine x 2)     Status: None   Collection Time: 04/25/2018  5:27 PM  Result Value Ref Range Status   Specimen Description   Final    BLOOD LEFT FORERM Performed at [facility] [address] [place], [place] [address]    Special Requests   Final    BOTTLES DRWN EROBIC ND NEROBIC Blood Culture results may not be optimal due to an inadequate volume of blood received in culture bottles Performed at [facility] [address] [place], [place] [address]    Culture   Final    NO GROWTH 5 DYS Performed at [facility], [place] [address], [place], [place] [address]    Report Status 05/06/2018 FINL  Final  Respiratory Panel by PCR     Status: None   Collection Time: 05/08/2018  6:16 PM  Result Value Ref Range Status    denovirus NOT DETECTED NOT DETECTED Final   Coronavirus 229E NOT DETECTED NOT DETECTED Final    Comment: (NOTE) The Coronavirus on the Respiratory Panel, DOES NOT test for the novel  Coronavirus (2019 nCoV)    Coronavirus HKU1 NOT DETECTED NOT DETECTED Final   Coronavirus NL63 NOT DETECTED NOT DETECTED Final   Coronavirus OC43 NOT DETECTED NOT DETECTED Final   Metapneumovirus NOT DETECTED NOT DETECTED Final   Rhinovirus / Enterovirus NOT DETECTED NOT DETECTED Final   Influenza  NOT DETECTED NOT DETECTED Final   Influenza B NOT DETECTED NOT DETECTED Final  Parainfluenza Virus 1 NOT DETECTED NOT DETECTED Final   Parainfluenza Virus 2 NOT DETECTED NOT DETECTED Final   Parainfluenza Virus 3 NOT DETECTED NOT DETECTED Final   Parainfluenza Virus 4 NOT DETECTED NOT DETECTED Final   Respiratory Syncytial Virus NOT DETECTED NOT DETECTED Final   Bordetella pertussis NOT DETECTED NOT DETECTED Final   Chlamydophila pneumoniae NOT DETECTED NOT DETECTED Final   Mycoplasma pneumoniae NOT DETECTED NOT DETECTED Final    Comment: Performed at [facility], [place] [address], [place], [place] [address]         Radiology Studies: No results found.      Scheduled Meds: . sodium chloride flush  3 mL Intravenous Q12H   Continuous Infusions: . chlorproMAZINE (THORAZINE) IV       LOS: 4 days    Time spent: over 30 min    Fayrene Helper, MD [facility] Pager AMION  If 7PM-7AM, please contact night-coverage [email or web address] Password [facility] 05/06/2018, 2:46 PM

## 2018-05-06 NOTE — Progress Notes (Signed)
PMT progress note  Patient is actively dying, unresponsive, appear comfortable.  Daughter holding vigil at bedside. Reports that he was given last rites today and she wonders if he has been "holding on" for this.  Medication history reviewed.  BP 131/75 (BP Location: Right Arm)   Pulse 70   Temp 97.8 F (36.6 C) (Oral)   Resp 20   SpO2 100%  Chart reviewed, labs and imaging noted.   shallow respirations. Periods of apnea noted Extremities still warm to touch No coolness no mottling S 1 S2 Unresponsive  PPS 10%  85 y.o.malewith hypothermia, sepsis, deemed secondary to UTI,acute hypoxic respiratory failure secondary to pulmonary edema,hyperkalemia, hypoglycemia, metabolic acidosis,andAKI. Actively dying and plan for full comfort care.  Plan: Continue current mode of care Anticipate hospital death Prognosis hours to some very limited number of days discussed with daughter.  I do not think that he appears stable enough at this point to transition from the hospital.  20 minutes spent Greater than 50%  of this time was spent counseling and coordinating care related to the above assessment and plan.  Micheline Rough, MD [facility] [phone]

## 2018-05-07 NOTE — Progress Notes (Signed)
PMT progress note  Patient is actively dying, unresponsive, appear comfortable.  Daughter holding vigil at bedside. States he has been more restless last 15 minutes.  Nursing aware and getting medication for him.  Medication history reviewed.  BP 91/66 (BP Location: Right Arm)   Pulse 75   Temp 97.8 F (36.6 C) (Oral)   Resp 19   SpO2 93%  Chart reviewed, labs and imaging noted.   shallow respirations. Periods of apnea noted Extremities still warm to touch No coolness no mottling S 1 S2 Unresponsive  PPS 10%  85 y.o.malewith hypothermia, sepsis, deemed secondary to UTI,acute hypoxic respiratory failure secondary to pulmonary edema,hyperkalemia, hypoglycemia, metabolic acidosis,andAKI. Actively dying and plan for full comfort care.  Plan: Continue current mode of care Anticipate hospital death Prognosis hours to some very limited number of days.  I do not think that he appears stable enough at this point to transition from the hospital.  20 minutes spent Greater than 50%  of this time was spent counseling and coordinating care related to the above assessment and plan.  Micheline Rough, MD [facility] [phone]

## 2018-05-07 NOTE — Progress Notes (Signed)
[place] donor notified for possible tissue and eye donation. Pt is not a potential donor. [place] donor Service Number [phone] per Ms. Rocky Crafts; Phone Number: [phone].

## 2018-05-07 NOTE — Progress Notes (Signed)
PROGRESS NOTE    Justin [name]  MGN:[id number] DOB: 11/17/1913 DOA: 04/13/2018 PCP: Carol Ada, MD   Brief Narrative:  HPI per Dr. Danise Mina [name] Justin [name] [phone] y.o.malewith medical history significant ofhypertension, bradycardia status post pacemaker, atrial fibrillation, chronic diastolic congestive heart failure presenting to the hospital via EMS for evaluation of altered mental status. No history could be obtained from the patient. Daughter states patient has been declining for the past 3 days. He has had multiple falls. One fall last week and 2 falls this week. States he was taken to the emergency room earlier this week and they were told he did not have any fractures. He was also diagnosed with Cruze Zingaro UTI recently and placed on Bactrim. Daughter states patient is very weak and has not been sleeping. Daughter denies any fevers, nausea, vomiting, or diarrhea. States patient has not complained of any pain.  ED Course:EMS noted patient was cool to touch with poor perfusion. Hypothermic with temperature 96.4 F on arrival andplaced [employer]. Hypotensive. Intermittently tachycardic and tachypneic. SPO2 84% on room air. CBG 62. White count 12.2. Lactic acid 2.6 >2.9 after 1 L fluid bolus. Patient continued to be hypotensive. INR 1.9. Potassium 6.3. Bicarb 16. BUN 73. Creatinine 2.9, baseline 1.4-1.5. UA with large amount of leukocytes, greater than 50 WBCs, and rare bacteria. Influenza panel negative. Respiratory viral panel negative. Head CT negative for acute finding. Chest x-ray with findings consistent with congestive heart failure including bilateral pulmonary edema and small bilateral pleural effusions. Patient received 1 amp D50, sodium bicarb 50 mEq, Ativan 1 mg total, and ceftriaxone.  ED provider had Taysom Glymph goals of care discussion with the patient's daughter.Daughter wanted the patient to be transition to full comfort care.   Assessment & Plan:    Principal Problem:   Severe sepsis ([place]) Active Problems:   Hypothermia   UTI (urinary tract infection)   Acute respiratory failure with hypoxia (HCC)   Pulmonary edema   Hyperkalemia   Hypoglycemia   Metabolic acidosis   AKI (acute kidney injury) ([place])   Palliative care by specialist   Goals of care, counseling/discussion   Generalized pain   Altered mental status   Acute on chronic diastolic CHF (congestive heart failure) ([place])   1 severe sepsis Patient presented with severe sepsis with hypothermia, leukocytosis, hypotension, acute renal failure, hyperkalemia, elevated lactic acid level with urinalysis worrisome for UTI.  CT head done was negative.  Chest x-ray negative for any acute infiltrates however consistent with CHF exacerbation.  Admitting physician and ED provider discussed goals of care discussion with patient's daughter and healthcare power of attorney, Justin [name], who has decided on full comfort measures.  End-of-life order set placed.  Patient minimally responsive.  Patient seen in consultation by palliative care.  Full comfort measures.  Patient placed on IV dilaudid for severe pain and dyspnea, Ativan as needed for anxiety, atropine and Robinul for secretions.  Supplemental oxygen for comfort.  Palliative care following and appreciate their input and recommendations.  Met with daughter again at bedside today, continuing comfort measures.    2.  Acute renal failure/hyperkalemia Likely secondary to prerenal azotemia.  Patient also noted to be hypotensive on admission.  Patient with concerns for UTI.  Family has decided on full comfort measures.  3.  Metabolic acidosis Secondary to problem #2.  4.  Acute on chronic diastolic heart failure Patient noted to be hypotensive on admission and as such posing difficulty with diuresis.  Family has decided on  full comfort measures.  5.  Acute encephalopathy Secondary to problems #1 2 and 3 and 4.  Patient currently full  comfort measures.  Patient minimally responsive.  Patient actively dying.  6.  Prognosis Patient is 85 year old gentleman history of hypertension, bradycardia status post PPM, Justin [name]. fib, chronic diastolic failure presented to the hospital with acute encephalopathy noted to be in severe sepsis.  Patient also noted to have Justin [name] UTI, hyperkalemia, hypothermia, acute renal failure, acute on chronic diastolic heart failure, metabolic acidosis.  Patient minimally responsive.  Patient actively dying.  Family have decided on full comfort measures.  Palliative care following for additional terminal care measures.  Patient likely in hospital death and prognosis is hours to limited number of days.  Palliative care following and appreciate input and recommendations.   DVT prophylaxis: comfort care Code Status: DNR Family Communication: daughter at bedside Disposition Plan: expect in hospital death   Consultants:   palliative  Procedures:  none  Antimicrobials:  Anti-infectives (From admission, onward)   Start     Dose/Rate Route Frequency Ordered Stop   05/03/2018 1600  cefTRIAXone (ROCEPHIN) 1 g in sodium chloride 0.9 % 100 mL IVPB  Status:  Discontinued     1 g 200 mL/hr over 30 Minutes Intravenous Every 24 hours 05/09/2018 1552 05/07/2018 2042     Subjective: Nonverbal. Daughter notes he seems to be comfortable.  Thought he may be transitioning yesterday.  Objective: Vitals:   05/02/18 0528 05/02/18 1735 05/05/18 0538 05/07/18 0640  BP: (!) 99/52 (!) 110/94 131/75 91/66  Pulse:  70 70 75  Resp:  '16 20 19  '$ Temp:   97.8 F (36.6 C)   TempSrc:   Oral   SpO2:  (!) 67% 100% 93%    Intake/Output Summary (Last 24 hours) at 05/07/2018 1423 Last data filed at 05/07/2018 0607 Gross per 24 hour  Intake 3 ml  Output -  Net 3 ml   There were no vitals filed for this visit.  Examination:  Limited exam given comfort care Pt nonverbal/unresponsive, lying in bed, appears comfortable at this time,  shallow respirations    Data Reviewed: I have personally reviewed following labs and imaging studies  CBC: Recent Labs  Lab 04/28/2018 1506  WBC 12.2*  NEUTROABS 10.5*  HGB 11.6*  HCT 36.5*  MCV 99.5  PLT 970   Basic Metabolic Panel: Recent Labs  Lab 04/16/2018 1506  NA 137  K 6.3*  CL 109  CO2 16*  GLUCOSE 80  BUN 73*  CREATININE 2.91*  CALCIUM 8.6*   GFR: Estimated Creatinine Clearance: 10 mL/min ([place] Dodgen) (by C-G formula based on SCr of 2.91 mg/dL (H)). Liver Function Tests: Recent Labs  Lab 05/05/2018 1506  AST 49*  ALT 30  ALKPHOS 116  BILITOT 1.3*  PROT 6.3*  ALBUMIN 3.2*   No results for input(s): LIPASE, AMYLASE in the last 168 hours. No results for input(s): AMMONIA in the last 168 hours. Coagulation Profile: Recent Labs  Lab 04/24/2018 1506  INR 1.9*   Cardiac Enzymes: No results for input(s): CKTOTAL, CKMB, CKMBINDEX, TROPONINI in the last 168 hours. BNP (last 3 results) No results for input(s): PROBNP in the last 8760 hours. HbA1C: No results for input(s): HGBA1C in the last 72 hours. CBG: Recent Labs  Lab 04/28/2018 1431 04/25/2018 1616 04/21/2018 1749  GLUCAP 62* 60* 141*   Lipid Profile: No results for input(s): CHOL, HDL, LDLCALC, TRIG, CHOLHDL, LDLDIRECT in the last 72 hours. Thyroid Function Tests: No results for  input(s): TSH, T4TOTAL, FREET4, T3FREE, THYROIDAB in the last 72 hours. Anemia Panel: No results for input(s): VITAMINB12, FOLATE, FERRITIN, TIBC, IRON, RETICCTPCT in the last 72 hours. Sepsis Labs: Recent Labs  Lab 05/03/2018 1506 05/12/2018 1854  LATICACIDVEN 2.6* 2.9*    Recent Results (from the past 240 hour(s))  Blood Cultures (routine x 2)     Status: None   Collection Time: 04/18/2018  3:06 PM  Result Value Ref Range Status   Specimen Description   Final    BLOOD RIGHT WRIST Performed at [facility] [address], [place], [place] [address]    Special Requests   Final    BOTTLES DRAWN AEROBIC AND  ANAEROBIC Blood Culture results may not be optimal due to an inadequate volume of blood received in culture bottles Performed at [facility] [address], [place], [place] [address]    Culture   Final    NO GROWTH 5 DAYS Performed at [facility], [place] [address], [place], [place] [address]    Report Status 05/06/2018 FINAL  Final  Urine culture     Status: None   Collection Time: 05/12/2018  4:50 PM  Result Value Ref Range Status   Specimen Description   Final    URINE, CATHETERIZED Performed at [facility], [place] [address] [place], [place] [address]    Special Requests   Final    NONE Performed at [facility], [place] [address], [place], [place] [address]    Culture   Final    NO GROWTH Performed at [facility], [place] [address], [place], [place] [address]    Report Status 05/03/2018 FINAL  Final  Blood Cultures (routine x 2)     Status: None   Collection Time: 04/24/2018  5:27 PM  Result Value Ref Range Status   Specimen Description   Final    BLOOD LEFT FOREARM Performed at [facility] [address] [place], [place] [address]    Special Requests   Final    BOTTLES DRAWN AEROBIC AND ANAEROBIC Blood Culture results may not be optimal due to an inadequate volume of blood received in culture bottles Performed at [facility] [address], [place], [place] [address]    Culture   Final    NO GROWTH 5 DAYS Performed at [facility], [place] [address] [place], [place] [address]    Report Status 05/06/2018 FINAL  Final  Respiratory Panel by PCR     Status: None   Collection Time: 04/17/2018  6:16 PM  Result Value Ref Range Status   Adenovirus NOT DETECTED NOT DETECTED Final   Coronavirus 229E NOT DETECTED NOT DETECTED Final    Comment: (NOTE) The Coronavirus on the Respiratory Panel, DOES NOT test for the novel  Coronavirus (2019 nCoV)    Coronavirus HKU1 NOT  DETECTED NOT DETECTED Final   Coronavirus NL63 NOT DETECTED NOT DETECTED Final   Coronavirus OC43 NOT DETECTED NOT DETECTED Final   Metapneumovirus NOT DETECTED NOT DETECTED Final   Rhinovirus / Enterovirus NOT DETECTED NOT DETECTED Final   Influenza Dajanae Brophy NOT DETECTED NOT DETECTED Final   Influenza B NOT DETECTED NOT DETECTED Final   Parainfluenza Virus 1 NOT DETECTED NOT DETECTED Final   Parainfluenza Virus 2 NOT DETECTED NOT DETECTED Final   Parainfluenza Virus 3 NOT DETECTED NOT DETECTED Final   Parainfluenza Virus 4 NOT DETECTED NOT DETECTED Final  Respiratory Syncytial Virus NOT DETECTED NOT DETECTED Final   Bordetella pertussis NOT DETECTED NOT DETECTED Final   Chlamydophila pneumoniae NOT DETECTED NOT DETECTED Final   Mycoplasma pneumoniae NOT DETECTED NOT DETECTED Final    Comment: Performed at [facility], [place] [address] [place], [place] [address]         Radiology Studies: No results found.      Scheduled Meds: . sodium chloride flush  3 mL Intravenous Q12H   Continuous Infusions: . chlorproMAZINE (THORAZINE) IV       LOS: 5 days    Time spent: over 30 min    Fayrene Helper, MD [facility] Pager AMION  If 7PM-7AM, please contact night-coverage [email or web address] Password [facility] 05/07/2018, 2:23 PM

## 2018-05-08 NOTE — Care Management Important Message (Signed)
Important Message  Patient Details  Name: KEYSHAUN EXLEY MRN: [id number] Date of Birth: 05/21/1913   Medicare Important Message Given:  Yes    Kerin Salen 05/08/2018, 10:39 AMImportant Message  Patient Details  Name: BARAKA KLATT MRN: [id number] Date of Birth: 07/26/1913   Medicare Important Message Given:  Yes    Kerin Salen 05/08/2018, 10:39 AM

## 2018-05-08 NOTE — Progress Notes (Signed)
PROGRESS NOTE    Justin [name]  [name] DOB: 1913-05-02 DOA: 05/06/2018 PCP: Justin Ada, MD   Brief Narrative:  HPI per Dr. Danise Mina R [name] Justin [name] [phone] y.o.malewith medical history significant ofhypertension, bradycardia status post pacemaker, atrial fibrillation, chronic diastolic congestive heart failure presenting to the hospital via EMS for evaluation of altered mental status. No history could be obtained from the patient. Daughter states patient has been declining for the past 3 days. He has had multiple falls. One fall last week and 2 falls this week. States he was taken to the emergency room earlier this week and they were told he did not have any fractures. He was also diagnosed with Justin [name] UTI recently and placed on Bactrim. Daughter states patient is very weak and has not been sleeping. Daughter denies any fevers, nausea, vomiting, or diarrhea. States patient has not complained of any pain.  ED Course:EMS noted patient was cool to touch with poor perfusion. Hypothermic with temperature 96.4 F on arrival andplaced [employer]. Hypotensive. Intermittently tachycardic and tachypneic. SPO2 84% on room air. CBG 62. White count 12.2. Lactic acid 2.6 >2.9 after 1 L fluid bolus. Patient continued to be hypotensive. INR 1.9. Potassium 6.3. Bicarb 16. BUN 73. Creatinine 2.9, baseline 1.4-1.5. UA with large amount of leukocytes, greater than 50 WBCs, and rare bacteria. Influenza panel negative. Respiratory viral panel negative. Head CT negative for acute finding. Chest x-ray with findings consistent with congestive heart failure including bilateral pulmonary edema and small bilateral pleural effusions. Patient received 1 amp D50, sodium bicarb 50 mEq, Ativan 1 mg total, and ceftriaxone.  ED provider had Justin [name] goals of care discussion with the patient's daughter.Daughter wanted the patient to be transition to full comfort care.   Assessment & Plan:    Principal Problem:   Severe sepsis ([place]) Active Problems:   Hypothermia   UTI (urinary tract infection)   Acute respiratory failure with hypoxia (HCC)   Pulmonary edema   Hyperkalemia   Hypoglycemia   Metabolic acidosis   AKI (acute kidney injury) ([place])   Palliative care by specialist   Goals of care, counseling/discussion   Generalized pain   Altered mental status   Acute on chronic diastolic CHF (congestive heart failure) ([place])   1 severe sepsis Patient presented with severe sepsis with hypothermia, leukocytosis, hypotension, acute renal failure, hyperkalemia, elevated lactic acid level with urinalysis worrisome for UTI.  CT head done was negative.  Chest x-ray negative for any acute infiltrates however consistent with CHF exacerbation.  Admitting physician and ED provider discussed goals of care discussion with patient's daughter and healthcare power of attorney, Justin [name], who has decided on full comfort measures.  End-of-life order set placed.  Patient minimally responsive.  Patient seen in consultation by palliative care.  Full comfort measures.  Patient placed on IV dilaudid for severe pain and dyspnea, Ativan as needed for anxiety, atropine and Robinul for secretions.  Supplemental oxygen for comfort.  Palliative care following and appreciate their input and recommendations.  Met with daughter again at bedside today, continuing comfort measures.    2.  Acute renal failure/hyperkalemia Likely secondary to prerenal azotemia.  Patient also noted to be hypotensive on admission.  Patient with concerns for UTI.  Family has decided on full comfort measures.  3.  Metabolic acidosis Secondary to problem #2.  4.  Acute on chronic diastolic heart failure Patient noted to be hypotensive on admission and as such posing difficulty with diuresis.  Family has decided on  full comfort measures.  5.  Acute encephalopathy Secondary to problems #1 2 and 3 and 4.  Patient currently full  comfort measures.  Patient minimally responsive.  Patient actively dying.  6.  Prognosis Patient is 85 year old gentleman history of hypertension, bradycardia status post PPM, Justin [name]. fib, chronic diastolic failure presented to the hospital with acute encephalopathy noted to be in severe sepsis.  Patient also noted to have Justin [name] UTI, hyperkalemia, hypothermia, acute renal failure, acute on chronic diastolic heart failure, metabolic acidosis.  Patient minimally responsive.  Patient actively dying.  Family have decided on full comfort measures.  Palliative care following for additional terminal care measures.  Patient likely in hospital death and prognosis is hours to limited number of days.  Palliative care following and appreciate input and recommendations.   DVT prophylaxis: comfort care Code Status: DNR Family Communication: daughter at bedside Disposition Plan: expect in hospital death   Consultants:   palliative  Procedures:  none  Antimicrobials:  Anti-infectives (From admission, onward)   Start     Dose/Rate Route Frequency Ordered Stop   04/14/2018 1600  cefTRIAXone (ROCEPHIN) 1 g in sodium chloride 0.9 % 100 mL IVPB  Status:  Discontinued     1 g 200 mL/hr over 30 Minutes Intravenous Every 24 hours 04/24/2018 1552 04/30/2018 2042     Subjective: Nonverbal, unresponsive. Appears comfortable. Daughter notes he seems comfortable.  Objective: Vitals:   05/02/18 1735 05/05/18 0538 05/07/18 0640 05/08/18 0611  BP: (!) 110/94 131/75 91/66 (!) 74/46  Pulse: 70 70 75 (!) 53  Resp: '16 20 19 '$ (!) 28  Temp:  97.8 F (36.6 C)  97.6 F (36.4 C)  TempSrc:  Oral  Oral  SpO2: (!) 67% 100% 93% 92%    Intake/Output Summary (Last 24 hours) at 05/08/2018 1137 Last data filed at 05/07/2018 2200 Gross per 24 hour  Intake 3 ml  Output -  Net 3 ml   There were no vitals filed for this visit.  Examination:  Limited exam given comfort care Unresponsive with shallow respirations.  Appears  comfortable, lying in bed.   Data Reviewed: I have personally reviewed following labs and imaging studies  CBC: Recent Labs  Lab 04/20/2018 1506  WBC 12.2*  NEUTROABS 10.5*  HGB 11.6*  HCT 36.5*  MCV 99.5  PLT 096   Basic Metabolic Panel: Recent Labs  Lab 04/13/2018 1506  NA 137  K 6.3*  CL 109  CO2 16*  GLUCOSE 80  BUN 73*  CREATININE 2.91*  CALCIUM 8.6*   GFR: Estimated Creatinine Clearance: 10 mL/min (Tamzin Bertling) (by C-G formula based on SCr of 2.91 mg/dL (H)). Liver Function Tests: Recent Labs  Lab 04/16/2018 1506  AST 49*  ALT 30  ALKPHOS 116  BILITOT 1.3*  PROT 6.3*  ALBUMIN 3.2*   No results for input(s): LIPASE, AMYLASE in the last 168 hours. No results for input(s): AMMONIA in the last 168 hours. Coagulation Profile: Recent Labs  Lab 04/16/2018 1506  INR 1.9*   Cardiac Enzymes: No results for input(s): CKTOTAL, CKMB, CKMBINDEX, TROPONINI in the last 168 hours. BNP (last 3 results) No results for input(s): PROBNP in the last 8760 hours. HbA1C: No results for input(s): HGBA1C in the last 72 hours. CBG: Recent Labs  Lab 04/14/2018 1431 04/14/2018 1616 04/30/2018 1749  GLUCAP 62* 60* 141*   Lipid Profile: No results for input(s): CHOL, HDL, LDLCALC, TRIG, CHOLHDL, LDLDIRECT in the last 72 hours. Thyroid Function Tests: No results for input(s): TSH, T4TOTAL, FREET4,  T3FREE, THYROIDAB in the last 72 hours. Anemia Panel: No results for input(s): VITAMINB12, FOLATE, FERRITIN, TIBC, IRON, RETICCTPCT in the last 72 hours. Sepsis Labs: Recent Labs  Lab 04/16/2018 1506 05/08/2018 1854  LATICACIDVEN 2.6* 2.9*    Recent Results (from the past 240 hour(s))  Blood Cultures (routine x 2)     Status: None   Collection Time: 05/04/2018  3:06 PM  Result Value Ref Range Status   Specimen Description   Final    BLOOD RIGHT WRIST Performed at [facility] [address], [place], [place] [address]    Special Requests   Final    BOTTLES DRAWN AEROBIC AND  ANAEROBIC Blood Culture results may not be optimal due to an inadequate volume of blood received in culture bottles Performed at [facility] [address], [place], [place] [address]    Culture   Final    NO GROWTH 5 DAYS Performed at [facility], [place] [address] [place], [place] [address]    Report Status 05/06/2018 FINAL  Final  Urine culture     Status: None   Collection Time: 04/28/2018  4:50 PM  Result Value Ref Range Status   Specimen Description   Final    URINE, CATHETERIZED Performed at [facility], [place] [address], [place], [place] [address]    Special Requests   Final    NONE Performed at [facility], [place] [address], [place], [place] [address]    Culture   Final    NO GROWTH Performed at [facility], [place] [address] [place], [place] [address]    Report Status 05/03/2018 FINAL  Final  Blood Cultures (routine x 2)     Status: None   Collection Time: 05/08/2018  5:27 PM  Result Value Ref Range Status   Specimen Description   Final    BLOOD LEFT FOREARM Performed at [facility] [address], [place], [place] [address]    Special Requests   Final    BOTTLES DRAWN AEROBIC AND ANAEROBIC Blood Culture results may not be optimal due to an inadequate volume of blood received in culture bottles Performed at [facility] [address], [place], [place] [address]    Culture   Final    NO GROWTH 5 DAYS Performed at [facility], [place] [address] [place], [place] [address]    Report Status 05/06/2018 FINAL  Final  Respiratory Panel by PCR     Status: None   Collection Time: 05/06/2018  6:16 PM  Result Value Ref Range Status   Adenovirus NOT DETECTED NOT DETECTED Final   Coronavirus 229E NOT DETECTED NOT DETECTED Final    Comment: (NOTE) The Coronavirus on the Respiratory Panel, DOES NOT test for the novel  Coronavirus (2019 nCoV)    Coronavirus HKU1 NOT  DETECTED NOT DETECTED Final   Coronavirus NL63 NOT DETECTED NOT DETECTED Final   Coronavirus OC43 NOT DETECTED NOT DETECTED Final   Metapneumovirus NOT DETECTED NOT DETECTED Final   Rhinovirus / Enterovirus NOT DETECTED NOT DETECTED Final   Influenza Alaysiah Browder NOT DETECTED NOT DETECTED Final   Influenza B NOT DETECTED NOT DETECTED Final   Parainfluenza Virus 1 NOT DETECTED NOT DETECTED Final   Parainfluenza Virus 2 NOT DETECTED NOT DETECTED Final   Parainfluenza Virus 3 NOT DETECTED NOT DETECTED Final   Parainfluenza Virus 4 NOT DETECTED NOT DETECTED Final   Respiratory Syncytial Virus NOT  DETECTED NOT DETECTED Final   Bordetella pertussis NOT DETECTED NOT DETECTED Final   Chlamydophila pneumoniae NOT DETECTED NOT DETECTED Final   Mycoplasma pneumoniae NOT DETECTED NOT DETECTED Final    Comment: Performed at [facility], [place] [address] [place], [place] [address]         Radiology Studies: No results found.      Scheduled Meds: . sodium chloride flush  3 mL Intravenous Q12H   Continuous Infusions: . chlorproMAZINE (THORAZINE) IV       LOS: 6 days    Time spent: over 30 min    Fayrene Helper, MD [facility] Pager AMION  If 7PM-7AM, please contact night-coverage [email or web address] Password [facility] 05/08/2018, 11:37 AM

## 2018-05-08 NOTE — Progress Notes (Signed)
PMT progress note  Patient is actively dying, unresponsive, appear comfortable.  Daughter holding vigil at bedside. States he has been resting comfortably today.  We talked about stress of unknown when someone close is dying.    Medication history reviewed.  BP (!) 74/46 (BP Location: Right Arm)   Pulse (!) 53   Temp 97.6 F (36.4 C) (Oral)   Resp (!) 28   SpO2 92%  Chart reviewed, labs and imaging noted.   shallow respirations. Periods of apnea noted Extremities still warm to touch No coolness no mottling S 1 S2 Unresponsive  PPS 10%  85 y.o.malewith hypothermia, sepsis, deemed secondary to UTI,acute hypoxic respiratory failure secondary to pulmonary edema,hyperkalemia, hypoglycemia, metabolic acidosis,andAKI. Actively dying and plan for full comfort care.  Plan: Continue current mode of care Anticipate hospital death Prognosis hours to some very limited number of days.  I do not think that he appears stable enough at this point to transition from the hospital.  20 minutes spent Greater than 50%  of this time was spent counseling and coordinating care related to the above assessment and plan.  Micheline Rough, MD [place] Team [phone]

## 2018-05-09 NOTE — Progress Notes (Signed)
PMT progress note  Patient is actively dying, unresponsive, appear comfortable.  Daughter holding vigil at bedside. States he has been resting comfortably today.  We talked about stress of unknown when someone close is dying.    Medication history reviewed.  BP (!) 74/46 (BP Location: Right Arm)   Pulse (!) 53   Temp 97.6 F (36.4 C) (Oral)   Resp (!) 28   SpO2 92%  Chart reviewed, labs and imaging noted.   shallow respirations. Periods of apnea noted Extremities still warm to touch No coolness no mottling S 1 S2 Unresponsive  PPS 10%  85 y.o.malewith hypothermia, sepsis, deemed secondary to UTI,acute hypoxic respiratory failure secondary to pulmonary edema,hyperkalemia, hypoglycemia, metabolic acidosis,andAKI. Actively dying and plan for full comfort care.  Plan: Continue current mode of care Anticipate hospital death Prognosis hours to some very limited number of days.  I do not think that he appears stable enough at this point to transition from the hospital.  20 minutes spent Greater than 50%  of this time was spent counseling and coordinating care related to the above assessment and plan.  Micheline Rough, MD [place] Team [phone]

## 2018-05-09 NOTE — Progress Notes (Signed)
PROGRESS NOTE    Justin [name]  ZOX:[id number] DOB: [birth year] DOA: 04/18/2018 PCP: Carol Ada, MD   Brief Narrative:  HPI per Dr. Danise Mina R [name] a [phone] y.o.malewith medical history significant ofhypertension, bradycardia status post pacemaker, atrial fibrillation, chronic diastolic congestive heart failure presenting to the hospital via EMS for evaluation of altered mental status. No history could be obtained from the patient. Daughter states patient has been declining for the past 3 days. He has had multiple falls. One fall last week and 2 falls this week. States he was taken to the emergency room earlier this week and they were told he did not have any fractures. He was also diagnosed with a UTI recently and placed on Bactrim. Daughter states patient is very weak and has not been sleeping. Daughter denies any fevers, nausea, vomiting, or diarrhea. States patient has not complained of any pain.  ED Course:EMS noted patient was cool to touch with poor perfusion. Hypothermic with temperature 96.4 F on arrival andplaced [employer]. Hypotensive. Intermittently tachycardic and tachypneic. SPO2 84% on room air. CBG 62. White count 12.2. Lactic acid 2.6 >2.9 after 1 L fluid bolus. Patient continued to be hypotensive. INR 1.9. Potassium 6.3. Bicarb 16. BUN 73. Creatinine 2.9, baseline 1.4-1.5. UA with large amount of leukocytes, greater than 50 WBCs, and rare bacteria. Influenza panel negative. Respiratory viral panel negative. Head CT negative for acute finding. Chest x-ray with findings consistent with congestive heart failure including bilateral pulmonary edema and small bilateral pleural effusions. Patient received 1 amp D50, sodium bicarb 50 mEq, Ativan 1 mg total, and ceftriaxone.  ED provider had a goals of care discussion with the patient's daughter.Daughter wanted the patient to be transition to full comfort care.   Assessment & Plan:    Principal Problem:   Severe sepsis ([place]) Active Problems:   Hypothermia   UTI (urinary tract infection)   Acute respiratory failure with hypoxia ([facility])   Pulmonary edema   Hyperkalemia   Hypoglycemia   Metabolic acidosis   AKI (acute kidney injury) ([name])   Palliative care by specialist   Goals of care, counseling/discussion   Generalized pain   Altered mental status   Acute on chronic diastolic CHF (congestive heart failure) ([name])   1 severe sepsis Patient presented with severe sepsis with hypothermia, leukocytosis, hypotension, acute renal failure, hyperkalemia, elevated lactic acid level with urinalysis worrisome for UTI.  CT head done was negative.  Chest x-ray negative for any acute infiltrates however consistent with CHF exacerbation.  Admitting physician and ED provider discussed goals of care discussion with patient's daughter and healthcare power of attorney, Justin [name], who has decided on full comfort measures.  End-of-life order set placed.  Patient minimally responsive.  Patient seen in consultation by palliative care.  Full comfort measures.  Patient placed on IV dilaudid for severe pain and dyspnea, Ativan as needed for anxiety, atropine and Robinul for secretions.  Supplemental oxygen for comfort.  Palliative care following and appreciate their input and recommendations.  Met with daughter again at bedside today, continuing comfort measures.  Pt actively dying, expect hospital death, possibly within hours.     2.  Acute renal failure/hyperkalemia Likely secondary to prerenal azotemia.  Patient also noted to be hypotensive on admission.  Patient with concerns for UTI.  Family has decided on full comfort measures.  3.  Metabolic acidosis Secondary to problem #2.  4.  Acute on chronic diastolic heart failure Patient noted to be hypotensive on admission and  as such posing difficulty with diuresis.  Family has decided on full comfort measures.  5.  Acute encephalopathy  Secondary to problems #1 2 and 3 and 4.  Patient currently full comfort measures.  Patient minimally responsive.  Patient actively dying.  6.  Prognosis Patient is 85 year old gentleman history of hypertension, bradycardia status post PPM, A. fib, chronic diastolic failure presented to the hospital with acute encephalopathy noted to be in severe sepsis.  Patient also noted to have a UTI, hyperkalemia, hypothermia, acute renal failure, acute on chronic diastolic heart failure, metabolic acidosis.  Patient minimally responsive.  Patient actively dying.  Family have decided on full comfort measures.  Palliative care following for additional terminal care measures.  Patient likely in hospital death and prognosis is hours to limited number of days.  Palliative care following and appreciate input and recommendations.   DVT prophylaxis: comfort care Code Status: DNR Family Communication: daughter at bedside Disposition Plan: expect in hospital death   Consultants:   palliative  Procedures:  none  Antimicrobials:  Anti-infectives (From admission, onward)   Start     Dose/Rate Route Frequency Ordered Stop   05/05/2018 1600  cefTRIAXone (ROCEPHIN) 1 g in sodium chloride 0.9 % 100 mL IVPB  Status:  Discontinued     1 g 200 mL/hr over 30 Minutes Intravenous Every 24 hours 05/02/2018 1552 04/14/2018 2042     Subjective: Unresponsive Daughter at bedside  Objective: Vitals:   05/02/18 1735 05/05/18 0538 05/07/18 0640 05/08/18 0611  BP: (!) 110/94 131/75 91/66 (!) 74/46  Pulse: 70 70 75 (!) 53  Resp: '16 20 19 '$ (!) 28  Temp:  97.8 F (36.6 C)  97.6 F (36.4 C)  TempSrc:  Oral  Oral  SpO2: (!) 67% 100% 93% 92%    Intake/Output Summary (Last 24 hours) at 05/09/2018 1119 Last data filed at 05/09/2018 1018 Gross per 24 hour  Intake 0 ml  Output 100 ml  Net -100 ml   There were no vitals filed for this visit.  Examination:  Limited exam given comfort care Unresponsive with shallow  respirations   Data Reviewed: I have personally reviewed following labs and imaging studies  CBC: No results for input(s): WBC, NEUTROABS, HGB, HCT, MCV, PLT in the last 168 hours. Basic Metabolic Panel: No results for input(s): NA, K, CL, CO2, GLUCOSE, BUN, CREATININE, CALCIUM, MG, PHOS in the last 168 hours. GFR: Estimated Creatinine Clearance: 10 mL/min (A) (by C-G formula based on SCr of 2.91 mg/dL (H)). Liver Function Tests: No results for input(s): AST, ALT, ALKPHOS, BILITOT, PROT, ALBUMIN in the last 168 hours. No results for input(s): LIPASE, AMYLASE in the last 168 hours. No results for input(s): AMMONIA in the last 168 hours. Coagulation Profile: No results for input(s): INR, PROTIME in the last 168 hours. Cardiac Enzymes: No results for input(s): CKTOTAL, CKMB, CKMBINDEX, TROPONINI in the last 168 hours. BNP (last 3 results) No results for input(s): PROBNP in the last 8760 hours. HbA1C: No results for input(s): HGBA1C in the last 72 hours. CBG: No results for input(s): GLUCAP in the last 168 hours. Lipid Profile: No results for input(s): CHOL, HDL, LDLCALC, TRIG, CHOLHDL, LDLDIRECT in the last 72 hours. Thyroid Function Tests: No results for input(s): TSH, T4TOTAL, FREET4, T3FREE, THYROIDAB in the last 72 hours. Anemia Panel: No results for input(s): VITAMINB12, FOLATE, FERRITIN, TIBC, IRON, RETICCTPCT in the last 72 hours. Sepsis Labs: No results for input(s): PROCALCITON, LATICACIDVEN in the last 168 hours.  Recent Results (from  the past 240 hour(s))  Blood Cultures (routine x 2)     Status: None   Collection Time: 04/18/2018  3:06 PM  Result Value Ref Range Status   Specimen Description   Final    BLOOD RIGHT WRIST Performed at [facility] [address] [place], [place] [address]    Special Requests   Final    BOTTLES DRAWN AEROBIC AND ANAEROBIC Blood Culture results may not be optimal due to an inadequate volume of blood received in  culture bottles Performed at [facility] [address], [place], [place] [address]    Culture   Final    NO GROWTH 5 DAYS Performed at [facility], [place] [address], [place], [place] [address]    Report Status 05/06/2018 FINAL  Final  Urine culture     Status: None   Collection Time: 04/17/2018  4:50 PM  Result Value Ref Range Status   Specimen Description   Final    URINE, CATHETERIZED Performed at [facility], [place] [address] [place], [place] [address]    Special Requests   Final    NONE Performed at [facility], [place] [address], [place], [place] [address]    Culture   Final    NO GROWTH Performed at [facility], [place] [address], [place], [place] [address]    Report Status 05/03/2018 FINAL  Final  Blood Cultures (routine x 2)     Status: None   Collection Time: 05/02/2018  5:27 PM  Result Value Ref Range Status   Specimen Description   Final    BLOOD LEFT FOREARM Performed at [facility] [address], [place], [place] [address]    Special Requests   Final    BOTTLES DRAWN AEROBIC AND ANAEROBIC Blood Culture results may not be optimal due to an inadequate volume of blood received in culture bottles Performed at [facility] [address] [place], [place] [address]    Culture   Final    NO GROWTH 5 DAYS Performed at [facility], [place] [address], [place], [place] [address]    Report Status 05/06/2018 FINAL  Final  Respiratory Panel by PCR     Status: None   Collection Time: 04/19/2018  6:16 PM  Result Value Ref Range Status   Adenovirus NOT DETECTED NOT DETECTED Final   Coronavirus 229E NOT DETECTED NOT DETECTED Final    Comment: (NOTE) The Coronavirus on the Respiratory Panel, DOES NOT test for the novel  Coronavirus (2019 nCoV)    Coronavirus HKU1 NOT DETECTED NOT DETECTED Final   Coronavirus NL63 NOT DETECTED NOT DETECTED Final   Coronavirus OC43  NOT DETECTED NOT DETECTED Final   Metapneumovirus NOT DETECTED NOT DETECTED Final   Rhinovirus / Enterovirus NOT DETECTED NOT DETECTED Final   Influenza A NOT DETECTED NOT DETECTED Final   Influenza B NOT DETECTED NOT DETECTED Final   Parainfluenza Virus 1 NOT DETECTED NOT DETECTED Final   Parainfluenza Virus 2 NOT DETECTED NOT DETECTED Final   Parainfluenza Virus 3 NOT DETECTED NOT DETECTED Final   Parainfluenza Virus 4 NOT DETECTED NOT DETECTED Final   Respiratory Syncytial Virus NOT DETECTED NOT DETECTED Final   Bordetella pertussis NOT DETECTED NOT DETECTED Final   Chlamydophila pneumoniae NOT DETECTED NOT DETECTED Final   Mycoplasma pneumoniae NOT DETECTED NOT DETECTED Final    Comment: Performed at [facility], [place]  [address], [place],  [address]         Radiology Studies: No results found.      Scheduled Meds: . sodium chloride flush  3 mL Intravenous Q12H   Continuous Infusions: . chlorproMAZINE (THORAZINE) IV       LOS: 7 days    Time spent: over 30 min    Fayrene Helper, MD [facility] Pager AMION  If 7PM-7AM, please contact night-coverage [email or web address] Password [email or web address] 05/09/2018, 11:19 AM

## 2018-05-10 NOTE — Progress Notes (Signed)
PMT progress note  Patient is actively dying, unresponsive, appear comfortable.  Daughter holding vigil at bedside. States he has been resting comfortably today.  Discussed again stress of unknown when someone close is dying.    Medication history reviewed.  BP (!) 74/46 (BP Location: Right Arm)   Pulse (!) 53   Temp 97.6 F (36.4 C) (Oral)   Resp (!) 28   SpO2 92%  Chart reviewed, labs and imaging noted.   shallow respirations. Periods of apnea noted + coolness no mottling S 1 S2 Unresponsive  PPS 10%  85 y.o.malewith hypothermia, sepsis, deemed secondary to UTI,acute hypoxic respiratory failure secondary to pulmonary edema,hyperkalemia, hypoglycemia, metabolic acidosis,andAKI. Actively dying and plan for full comfort care.  Plan: Continue current mode of care Anticipate hospital death Prognosis hours to some very limited number of days.  I do not think that he appears stable enough at this point to transition from the hospital.  20 minutes spent Greater than 50%  of this time was spent counseling and coordinating care related to the above assessment and plan.  Micheline Rough, MD [place] Team [phone]

## 2018-05-10 NOTE — Progress Notes (Signed)
PROGRESS NOTE    Justin [name]  MWU:[id number] DOB: January 13, 1914 DOA: 04/12/2018 PCP: Carol Ada, MD   Brief Narrative:  HPI per Dr. Danise Mina [name] Nicholl Onstott [phone] y.o.malewith medical history significant ofhypertension, bradycardia status post pacemaker, atrial fibrillation, chronic diastolic congestive heart failure presenting to the hospital via EMS for evaluation of altered mental status. No history could be obtained from the patient. Daughter states patient has been declining for the past 3 days. He has had multiple falls. One fall last week and 2 falls this week. States he was taken to the emergency room earlier this week and they were told he did not have any fractures. He was also diagnosed with Stavros Cail UTI recently and placed on Bactrim. Daughter states patient is very weak and has not been sleeping. Daughter denies any fevers, nausea, vomiting, or diarrhea. States patient has not complained of any pain.  ED Course:EMS noted patient was cool to touch with poor perfusion. Hypothermic with temperature 96.4 F on arrival andplaced [employer]. Hypotensive. Intermittently tachycardic and tachypneic. SPO2 84% on room air. CBG 62. White count 12.2. Lactic acid 2.6 >2.9 after 1 L fluid bolus. Patient continued to be hypotensive. INR 1.9. Potassium 6.3. Bicarb 16. BUN 73. Creatinine 2.9, baseline 1.4-1.5. UA with large amount of leukocytes, greater than 50 WBCs, and rare bacteria. Influenza panel negative. Respiratory viral panel negative. Head CT negative for acute finding. Chest x-ray with findings consistent with congestive heart failure including bilateral pulmonary edema and small bilateral pleural effusions. Patient received 1 amp D50, sodium bicarb 50 mEq, Ativan 1 mg total, and ceftriaxone.  ED provider had Ashlyn Cabler goals of care discussion with the patient's daughter.Daughter wanted the patient to be transition to full comfort care.   Assessment & Plan:    Principal Problem:   Severe sepsis ([place]) Active Problems:   Hypothermia   UTI (urinary tract infection)   Acute respiratory failure with hypoxia (HCC)   Pulmonary edema   Hyperkalemia   Hypoglycemia   Metabolic acidosis   AKI (acute kidney injury) ([place])   Palliative care by specialist   Goals of care, counseling/discussion   Generalized pain   Altered mental status   Acute on chronic diastolic CHF (congestive heart failure) ([place])   1 severe sepsis Patient presented with severe sepsis with hypothermia, leukocytosis, hypotension, acute renal failure, hyperkalemia, elevated lactic acid level with urinalysis worrisome for UTI.  CT head done was negative.  Chest x-ray negative for any acute infiltrates however consistent with CHF exacerbation.  Admitting physician and ED provider discussed goals of care discussion with patient's daughter and healthcare power of attorney, [name], who has decided on full comfort measures.  End-of-life order set placed.  Patient minimally responsive.  Patient seen in consultation by palliative care.  Full comfort measures.  Patient placed on IV dilaudid for severe pain and dyspnea, Ativan as needed for anxiety, atropine and Robinul for secretions.  Supplemental oxygen for comfort.  Palliative care following and appreciate their input and recommendations.  Met with daughter again at bedside today, continuing comfort measures.  Pt actively dying, expect hospital death, possibly within hours.     2.  Acute renal failure/hyperkalemia Likely secondary to prerenal azotemia.  Patient also noted to be hypotensive on admission.  Patient with concerns for UTI.  Family has decided on full comfort measures.  3.  Metabolic acidosis Secondary to problem #2.  4.  Acute on chronic diastolic heart failure Patient noted to be hypotensive on admission and  as such posing difficulty with diuresis.  Family has decided on full comfort measures.  5.  Acute encephalopathy  Secondary to problems #1 2 and 3 and 4.  Patient currently full comfort measures.  Patient minimally responsive.  Patient actively dying.  6.  Prognosis Patient is 85 year old gentleman history of hypertension, bradycardia status post PPM, Justin [name]. fib, chronic diastolic failure presented to the hospital with acute encephalopathy noted to be in severe sepsis.  Patient also noted to have Justin [name] UTI, hyperkalemia, hypothermia, acute renal failure, acute on chronic diastolic heart failure, metabolic acidosis.  Patient minimally responsive.  Patient actively dying.  Family have decided on full comfort measures.  Palliative care following for additional terminal care measures.  Patient likely in hospital death and prognosis is hours to limited number of days.  Palliative care following and appreciate input and recommendations.   DVT prophylaxis: comfort care Code Status: DNR Family Communication: daughter at bedside Disposition Plan: expect in hospital death   Consultants:   palliative  Procedures:  none  Antimicrobials:  Anti-infectives (From admission, onward)   Start     Dose/Rate Route Frequency Ordered Stop   04/22/2018 1600  cefTRIAXone (ROCEPHIN) 1 g in sodium chloride 0.9 % 100 mL IVPB  Status:  Discontinued     1 g 200 mL/hr over 30 Minutes Intravenous Every 24 hours 05/09/2018 1552 05/03/2018 2042     Subjective: Unresponsive Daughter at bedside  Objective: Vitals:   05/02/18 1735 05/05/18 0538 05/07/18 0640 05/08/18 0611  BP: (!) 110/94 131/75 91/66 (!) 74/46  Pulse: 70 70 75 (!) 53  Resp: '16 20 19 '$ (!) 28  Temp:    97.6 F (36.4 C)  TempSrc:  Oral  Oral  SpO2: (!) 67% 100% 93% 92%   No intake or output data in the 24 hours ending 05/10/18 1657 There were no vitals filed for this visit.  Examination:  Limited exam given comfort care Unresponsive with shallow respirations   Data Reviewed: I have personally reviewed following labs and imaging studies  CBC: No results for  input(s): WBC, NEUTROABS, HGB, HCT, MCV, PLT in the last 168 hours. Basic Metabolic Panel: No results for input(s): NA, K, CL, CO2, GLUCOSE, BUN, CREATININE, CALCIUM, MG, PHOS in the last 168 hours. GFR: Estimated Creatinine Clearance: 10 mL/min (Hendrick Pavich) (by C-G formula based on SCr of 2.91 mg/dL (H)). Liver Function Tests: No results for input(s): AST, ALT, ALKPHOS, BILITOT, PROT, ALBUMIN in the last 168 hours. No results for input(s): LIPASE, AMYLASE in the last 168 hours. No results for input(s): AMMONIA in the last 168 hours. Coagulation Profile: No results for input(s): INR, PROTIME in the last 168 hours. Cardiac Enzymes: No results for input(s): CKTOTAL, CKMB, CKMBINDEX, TROPONINI in the last 168 hours. BNP (last 3 results) No results for input(s): PROBNP in the last 8760 hours. HbA1C: No results for input(s): HGBA1C in the last 72 hours. CBG: No results for input(s): GLUCAP in the last 168 hours. Lipid Profile: No results for input(s): CHOL, HDL, LDLCALC, TRIG, CHOLHDL, LDLDIRECT in the last 72 hours. Thyroid Function Tests: No results for input(s): TSH, T4TOTAL, FREET4, T3FREE, THYROIDAB in the last 72 hours. Anemia Panel: No results for input(s): VITAMINB12, FOLATE, FERRITIN, TIBC, IRON, RETICCTPCT in the last 72 hours. Sepsis Labs: No results for input(s): PROCALCITON, LATICACIDVEN in the last 168 hours.  Recent Results (from the past 240 hour(s))  Blood Cultures (routine x 2)     Status: None   Collection Time: 05/07/2018  3:06 PM  Result Value Ref Range Status   Specimen Description   Final    BLOOD RIGHT WRIST Performed at [facility] [address] [place], [place] [address]    Special Requests   Final    BOTTLES DRAWN AEROBIC AND ANAEROBIC Blood Culture results may not be optimal due to an inadequate volume of blood received in culture bottles Performed at [facility] [address], [place], [place] [address]    Culture    Final    NO GROWTH 5 DAYS Performed at [facility], [place] [address], [place], [place] [address]    Report Status 05/06/2018 FINAL  Final  Urine culture     Status: None   Collection Time: 04/14/2018  4:50 PM  Result Value Ref Range Status   Specimen Description   Final    URINE, CATHETERIZED Performed at [facility], [place] [address], [place], [place] [address]    Special Requests   Final    NONE Performed at [facility], [place] [address], [place], [place] [address]    Culture   Final    NO GROWTH Performed at [facility], [place] [address], [place], [place] [address]    Report Status 05/03/2018 FINAL  Final  Blood Cultures (routine x 2)     Status: None   Collection Time: 04/29/2018  5:27 PM  Result Value Ref Range Status   Specimen Description   Final    BLOOD LEFT FOREARM Performed at [facility] [address], [place], [place] [address]    Special Requests   Final    BOTTLES DRAWN AEROBIC AND ANAEROBIC Blood Culture results may not be optimal due to an inadequate volume of blood received in culture bottles Performed at [facility] [address], [place], [place] [address]    Culture   Final    NO GROWTH 5 DAYS Performed at [facility], [place] [address], [place], [place] [address]    Report Status 05/06/2018 FINAL  Final  Respiratory Panel by PCR     Status: None   Collection Time: 04/29/2018  6:16 PM  Result Value Ref Range Status   Adenovirus NOT DETECTED NOT DETECTED Final   Coronavirus 229E NOT DETECTED NOT DETECTED Final    Comment: (NOTE) The Coronavirus on the Respiratory Panel, DOES NOT test for the novel  Coronavirus (2019 nCoV)    Coronavirus HKU1 NOT DETECTED NOT DETECTED Final   Coronavirus NL63 NOT DETECTED NOT DETECTED Final   Coronavirus OC43 NOT DETECTED NOT DETECTED Final   Metapneumovirus NOT DETECTED NOT DETECTED Final   Rhinovirus / Enterovirus NOT DETECTED  NOT DETECTED Final   Influenza Jonatha Gagen NOT DETECTED NOT DETECTED Final   Influenza B NOT DETECTED NOT DETECTED Final   Parainfluenza Virus 1 NOT DETECTED NOT DETECTED Final   Parainfluenza Virus 2 NOT DETECTED NOT DETECTED Final   Parainfluenza Virus 3 NOT DETECTED NOT DETECTED Final   Parainfluenza Virus 4 NOT DETECTED NOT DETECTED Final   Respiratory Syncytial Virus NOT DETECTED NOT DETECTED Final   Bordetella pertussis NOT DETECTED NOT DETECTED Final   Chlamydophila pneumoniae NOT DETECTED NOT DETECTED Final   Mycoplasma pneumoniae NOT DETECTED NOT DETECTED Final    Comment: Performed at [facility], [place]. [address], [place], [place] [address]         Radiology Studies: No results found.      Scheduled Meds: .  sodium chloride flush  3 mL Intravenous Q12H   Continuous Infusions: . chlorproMAZINE (THORAZINE) IV       LOS: 8 days    Time spent: over 30 min    Fayrene Helper, MD [facility] Pager AMION  If 7PM-7AM, please contact night-coverage [email or web address] Password [facility] 05/10/2018, 4:57 PM

## 2018-05-11 NOTE — Progress Notes (Signed)
PMT progress note  Patient is actively dying, unresponsive, appears comfortable.  Daughter holding vigil at bedside. States he has been resting comfortably today.  Discussed again stress of unknown when someone close is dying.    Medication history reviewed.  BP (!) 74/46 (BP Location: Right Arm)   Pulse (!) 53   Temp 97.6 F (36.4 C) (Oral)   Resp (!) 28   SpO2 92%  Chart reviewed, labs and imaging noted.   shallow respirations. Periods of apnea noted + coolness no mottling S 1 S2 Unresponsive  PPS 10%  85 y.o.malewith hypothermia, sepsis, deemed secondary to UTI,acute hypoxic respiratory failure secondary to pulmonary edema,hyperkalemia, hypoglycemia, metabolic acidosis,andAKI. Actively dying and plan for full comfort care.  Plan: Continue current mode of care Anticipate hospital death Prognosis hours to some very limited number of days.  I do not think that he appears stable enough at this point to transition from the hospital.  20 minutes spent Greater than 50%  of this time was spent counseling and coordinating care related to the above assessment and plan.  Micheline Rough, MD [place] [facility] [phone]

## 2018-05-11 NOTE — Care Management Important Message (Signed)
Important Message  Patient Details  Name: Justin [name] MRN: [id number] Date of Birth: August 29, 1913   Medicare Important Message Given:  Yes    Kerin Salen 05/11/2018, 9:42 AMImportant Message  Patient Details  Name: Justin [name] MRN: [id number] Date of Birth: 1913/07/29   Medicare Important Message Given:  Yes    Kerin Salen 05/11/2018, 9:42 AM

## 2018-05-11 NOTE — Progress Notes (Signed)
PROGRESS NOTE    Justin [name]  OFH:[id number] DOB: 26-May-1913 DOA: 04/18/2018 PCP: Carol Ada, MD   Brief Narrative:  HPI per Dr. Danise Mina Justin [name] [phone] y.o.malewith medical history significant ofhypertension, bradycardia status post pacemaker, atrial fibrillation, chronic diastolic congestive heart failure presenting to the hospital via EMS for evaluation of altered mental status. No history could be obtained from the patient. Daughter states patient has been declining for the past 3 days. He has had multiple falls. One fall last week and 2 falls this week. States he was taken to the emergency room earlier this week and they were told he did not have any fractures. He was also diagnosed with Qais Jowers UTI recently and placed on Bactrim. Daughter states patient is very weak and has not been sleeping. Daughter denies any fevers, nausea, vomiting, or diarrhea. States patient has not complained of any pain.  ED Course:EMS noted patient was cool to touch with poor perfusion. Hypothermic with temperature 96.4 F on arrival andplaced [employer]. Hypotensive. Intermittently tachycardic and tachypneic. SPO2 84% on room air. CBG 62. White count 12.2. Lactic acid 2.6 >2.9 after 1 L fluid bolus. Patient continued to be hypotensive. INR 1.9. Potassium 6.3. Bicarb 16. BUN 73. Creatinine 2.9, baseline 1.4-1.5. UA with large amount of leukocytes, greater than 50 WBCs, and rare bacteria. Influenza panel negative. Respiratory viral panel negative. Head CT negative for acute finding. Chest x-ray with findings consistent with congestive heart failure including bilateral pulmonary edema and small bilateral pleural effusions. Patient received 1 amp D50, sodium bicarb 50 mEq, Ativan 1 mg total, and ceftriaxone.  ED provider had Makynleigh Breslin goals of care discussion with the patient's daughter.Daughter wanted the patient to be transition to full comfort care.   Assessment & Plan:    Principal Problem:   Severe sepsis ([place]) Active Problems:   Hypothermia   UTI (urinary tract infection)   Acute respiratory failure with hypoxia (HCC)   Pulmonary edema   Hyperkalemia   Hypoglycemia   Metabolic acidosis   AKI (acute kidney injury) ([place])   Palliative care by specialist   Goals of care, counseling/discussion   Generalized pain   Altered mental status   Acute on chronic diastolic CHF (congestive heart failure) ([place])   1 severe sepsis Patient presented with severe sepsis with hypothermia, leukocytosis, hypotension, acute renal failure, hyperkalemia, elevated lactic acid level with urinalysis worrisome for UTI.  CT head done was negative.  Chest x-ray negative for any acute infiltrates however consistent with CHF exacerbation.  Admitting physician and ED provider discussed goals of care discussion with patient's daughter and healthcare power of attorney, Justin [name], who has decided on full comfort measures.  End-of-life order set placed.  Patient minimally responsive.  Patient seen in consultation by palliative care.  Full comfort measures.  Patient placed on IV dilaudid for severe pain and dyspnea, Ativan as needed for anxiety, atropine and Robinul for secretions.  Supplemental oxygen for comfort.  Palliative care following and appreciate their input and recommendations.  Met with daughter again at bedside today, continuing comfort measures.  Pt actively dying, expect hospital death, possibly within hours.     2.  Acute renal failure/hyperkalemia Likely secondary to prerenal azotemia.  Patient also noted to be hypotensive on admission.  Patient with concerns for UTI.  Family has decided on full comfort measures.  3.  Metabolic acidosis Secondary to problem #2.  4.  Acute on chronic diastolic heart failure Patient noted to be hypotensive on admission and  as such posing difficulty with diuresis.  Family has decided on full comfort measures.  5.  Acute encephalopathy  Secondary to problems #1 2 and 3 and 4.  Patient currently full comfort measures.  Patient minimally responsive.  Patient actively dying.  6.  Prognosis Patient is 85 year old gentleman history of hypertension, bradycardia status post PPM, Justin [name]. fib, chronic diastolic failure presented to the hospital with acute encephalopathy noted to be in severe sepsis.  Patient also noted to have Justin [name] UTI, hyperkalemia, hypothermia, acute renal failure, acute on chronic diastolic heart failure, metabolic acidosis.  Patient minimally responsive.  Patient actively dying.  Family have decided on full comfort measures.  Palliative care following for additional terminal care measures.  Patient likely in hospital death and prognosis is hours to limited number of days.  Palliative care following and appreciate input and recommendations.   DVT prophylaxis: comfort care Code Status: DNR Family Communication: daughter at bedside Disposition Plan: expect in hospital death   Consultants:   palliative  Procedures:  none  Antimicrobials:  Anti-infectives (From admission, onward)   Start     Dose/Rate Route Frequency Ordered Stop   05/09/2018 1600  cefTRIAXone (ROCEPHIN) 1 g in sodium chloride 0.9 % 100 mL IVPB  Status:  Discontinued     1 g 200 mL/hr over 30 Minutes Intravenous Every 24 hours 04/22/2018 1552 05/02/2018 2042     Subjective: Unresponsive. Daughter at bedside.  Objective: Vitals:   05/02/18 1735 05/05/18 0538 05/07/18 0640 05/08/18 0611  BP: (!) 110/94 131/75 91/66 (!) 74/46  Pulse: 70 70 75 (!) 53  Resp: '16 20 19 '$ (!) 28  Temp:    97.6 F (36.4 C)  TempSrc:  Oral  Oral  SpO2: (!) 67% 100% 93% 92%    Intake/Output Summary (Last 24 hours) at 05/11/2018 1206 Last data filed at 05/11/2018 1100 Gross per 24 hour  Intake 0 ml  Output 850 ml  Net -850 ml   There were no vitals filed for this visit.  Examination:  Limited exam given comfort care Unresponsive with shallow respirations    Data Reviewed: I have personally reviewed following labs and imaging studies  CBC: No results for input(s): WBC, NEUTROABS, HGB, HCT, MCV, PLT in the last 168 hours. Basic Metabolic Panel: No results for input(s): NA, K, CL, CO2, GLUCOSE, BUN, CREATININE, CALCIUM, MG, PHOS in the last 168 hours. GFR: Estimated Creatinine Clearance: 10 mL/min (Justin [name]) (by C-G formula based on SCr of 2.91 mg/dL (H)). Liver Function Tests: No results for input(s): AST, ALT, ALKPHOS, BILITOT, PROT, ALBUMIN in the last 168 hours. No results for input(s): LIPASE, AMYLASE in the last 168 hours. No results for input(s): AMMONIA in the last 168 hours. Coagulation Profile: No results for input(s): INR, PROTIME in the last 168 hours. Cardiac Enzymes: No results for input(s): CKTOTAL, CKMB, CKMBINDEX, TROPONINI in the last 168 hours. BNP (last 3 results) No results for input(s): PROBNP in the last 8760 hours. HbA1C: No results for input(s): HGBA1C in the last 72 hours. CBG: No results for input(s): GLUCAP in the last 168 hours. Lipid Profile: No results for input(s): CHOL, HDL, LDLCALC, TRIG, CHOLHDL, LDLDIRECT in the last 72 hours. Thyroid Function Tests: No results for input(s): TSH, T4TOTAL, FREET4, T3FREE, THYROIDAB in the last 72 hours. Anemia Panel: No results for input(s): VITAMINB12, FOLATE, FERRITIN, TIBC, IRON, RETICCTPCT in the last 72 hours. Sepsis Labs: No results for input(s): PROCALCITON, LATICACIDVEN in the last 168 hours.  Recent Results (from the past 240  hour(s))  Blood Cultures (routine x 2)     Status: None   Collection Time: 04/18/2018  3:06 PM  Result Value Ref Range Status   Specimen Description   Final    BLOOD RIGHT WRIST Performed at [facility], [place] [address], [place], [place] [address]    Special Requests   Final    BOTTLES DRAWN AEROBIC AND ANAEROBIC Blood Culture results may not be optimal due to an inadequate volume of blood received in culture bottles  Performed at [facility] [address], [place], [place] [address]    Culture   Final    NO GROWTH 5 DAYS Performed at [facility], [place] [address], [place], [place] [address]    Report Status 05/06/2018 FINAL  Final  Urine culture     Status: None   Collection Time: 05/05/2018  4:50 PM  Result Value Ref Range Status   Specimen Description   Final    URINE, CATHETERIZED Performed at [facility], [place] [address], [place], [place] [address]    Special Requests   Final    NONE Performed at [facility], [place] [address] [place], [place] [address]    Culture   Final    NO GROWTH Performed at [facility], [place] [address], [place], [place] [address]    Report Status 05/03/2018 FINAL  Final  Blood Cultures (routine x 2)     Status: None   Collection Time: 04/18/2018  5:27 PM  Result Value Ref Range Status   Specimen Description   Final    BLOOD LEFT FOREARM Performed at [facility] [address], [place], [place] [address]    Special Requests   Final    BOTTLES DRAWN AEROBIC AND ANAEROBIC Blood Culture results may not be optimal due to an inadequate volume of blood received in culture bottles Performed at [facility] [address], [place], [place] [address]    Culture   Final    NO GROWTH 5 DAYS Performed at [facility], [place] [address] [place], [place] [address]    Report Status 05/06/2018 FINAL  Final  Respiratory Panel by PCR     Status: None   Collection Time: 04/15/2018  6:16 PM  Result Value Ref Range Status   Adenovirus NOT DETECTED NOT DETECTED Final   Coronavirus 229E NOT DETECTED NOT DETECTED Final    Comment: (NOTE) The Coronavirus on the Respiratory Panel, DOES NOT test for the novel  Coronavirus (2019 nCoV)    Coronavirus HKU1 NOT DETECTED NOT DETECTED Final   Coronavirus NL63 NOT DETECTED NOT DETECTED Final   Coronavirus OC43 NOT DETECTED NOT  DETECTED Final   Metapneumovirus NOT DETECTED NOT DETECTED Final   Rhinovirus / Enterovirus NOT DETECTED NOT DETECTED Final   Influenza Dequon Schnebly NOT DETECTED NOT DETECTED Final   Influenza B NOT DETECTED NOT DETECTED Final   Parainfluenza Virus 1 NOT DETECTED NOT DETECTED Final   Parainfluenza Virus 2 NOT DETECTED NOT DETECTED Final   Parainfluenza Virus 3 NOT DETECTED NOT DETECTED Final   Parainfluenza Virus 4 NOT DETECTED NOT DETECTED Final   Respiratory Syncytial Virus NOT DETECTED NOT DETECTED Final   Bordetella pertussis NOT DETECTED NOT DETECTED Final   Chlamydophila pneumoniae NOT DETECTED NOT DETECTED Final   Mycoplasma pneumoniae NOT DETECTED NOT DETECTED Final    Comment: Performed at [facility], [place]. [address], [place], [place]  [place]         Radiology Studies: No results found.      Scheduled Meds: . sodium chloride flush  3 mL Intravenous Q12H   Continuous Infusions: . chlorproMAZINE (THORAZINE) IV       LOS: 9 days    Time spent: over 30 min    Fayrene Helper, MD [facility] Pager AMION  If 7PM-7AM, please contact night-coverage [email or web address] Password [email or web address] 05/11/2018, 12:06 PM

## 2018-05-12 MED ORDER — HYDRALAZINE HCL 20 MG/ML IJ SOLN: 10.0000 mg | Freq: Once | INTRAMUSCULAR | Status: DC

## 2018-05-12 NOTE — Progress Notes (Signed)
PROGRESS NOTE    Justin [name]  WCH:[id number] DOB: 1913/05/18 DOA: 05/04/2018 PCP: Carol Ada, MD   Brief Narrative:  HPI per Dr. Danise Mina [name] [phone] y.o.malewith medical history significant ofhypertension, bradycardia status post pacemaker, atrial fibrillation, chronic diastolic congestive heart failure presenting to the hospital via EMS for evaluation of altered mental status. No history could be obtained from the patient. Daughter states patient has been declining for the past 3 days. He has had multiple falls. One fall last week and 2 falls this week. States he was taken to the emergency room earlier this week and they were told he did not have any fractures. He was also diagnosed with [name] UTI recently and placed on Bactrim. Daughter states patient is very weak and has not been sleeping. Daughter denies any fevers, nausea, vomiting, or diarrhea. States patient has not complained of any pain.  ED Course:EMS noted patient was cool to touch with poor perfusion. Hypothermic with temperature 96.4 F on arrival andplaced [employer]. Hypotensive. Intermittently tachycardic and tachypneic. SPO2 84% on room air. CBG 62. White count 12.2. Lactic acid 2.6 >2.9 after 1 L fluid bolus. Patient continued to be hypotensive. INR 1.9. Potassium 6.3. Bicarb 16. BUN 73. Creatinine 2.9, baseline 1.4-1.5. UA with large amount of leukocytes, greater than 50 WBCs, and rare bacteria. Influenza panel negative. Respiratory viral panel negative. Head CT negative for acute finding. Chest x-ray with findings consistent with congestive heart failure including bilateral pulmonary edema and small bilateral pleural effusions. Patient received 1 amp D50, sodium bicarb 50 mEq, Ativan 1 mg total, and ceftriaxone.  ED provider had [name] goals of care discussion with the patient's daughter.Daughter wanted the patient to be transition to full comfort care.   Assessment & Plan:    Principal Problem:   Severe sepsis ([place]) Active Problems:   Hypothermia   UTI (urinary tract infection)   Acute respiratory failure with hypoxia (HCC)   Pulmonary edema   Hyperkalemia   Hypoglycemia   Metabolic acidosis   AKI (acute kidney injury) ([place])   Palliative care by specialist   Goals of care, counseling/discussion   Generalized pain   Altered mental status   Acute on chronic diastolic CHF (congestive heart failure) ([place])   1 severe sepsis Patient presented with severe sepsis with hypothermia, leukocytosis, hypotension, acute renal failure, hyperkalemia, elevated lactic acid level with urinalysis worrisome for UTI.  CT head done was negative.  Chest x-ray negative for any acute infiltrates however consistent with CHF exacerbation.  Admitting physician and ED provider discussed goals of care discussion with patient's daughter and healthcare power of attorney, [name], who has decided on full comfort measures.  End-of-life order set placed.  Patient minimally responsive.  Patient seen in consultation by palliative care.  Full comfort measures.  Patient placed on IV dilaudid for severe pain and dyspnea, Ativan as needed for anxiety, atropine and Robinul for secretions.  Supplemental oxygen for comfort.  Palliative care following and appreciate their input and recommendations.  Met with daughter again at bedside today, continuing comfort measures.  Pt actively dying, expect hospital death, possibly within hours.     2.  Acute renal failure/hyperkalemia Likely secondary to prerenal azotemia.  Patient also noted to be hypotensive on admission.  Patient with concerns for UTI.  Family has decided on full comfort measures.  3.  Metabolic acidosis Secondary to problem #2.  4.  Acute on chronic diastolic heart failure Patient noted to be hypotensive on admission and  as such posing difficulty with diuresis.  Family has decided on full comfort measures.  5.  Acute encephalopathy  Secondary to problems #1 2 and 3 and 4.  Patient currently full comfort measures.  Patient minimally responsive.  Patient actively dying.  6.  Prognosis Patient is 86 year old gentleman history of hypertension, bradycardia status post PPM, Brytney Somes. fib, chronic diastolic failure presented to the hospital with acute encephalopathy noted to be in severe sepsis.  Patient also noted to have [name] UTI, hyperkalemia, hypothermia, acute renal failure, acute on chronic diastolic heart failure, metabolic acidosis.  Patient minimally responsive.  Patient actively dying.  Family have decided on full comfort measures.  Palliative care following for additional terminal care measures.  Patient likely in hospital death and prognosis is hours to limited number of days.  Palliative care following and appreciate input and recommendations.   DVT prophylaxis: comfort care Code Status: DNR Family Communication: daughter at bedside Disposition Plan: expect in hospital death   Consultants:   palliative  Procedures:  none  Antimicrobials:  Anti-infectives (From admission, onward)   Start     Dose/Rate Route Frequency Ordered Stop   05/05/2018 1600  cefTRIAXone (ROCEPHIN) 1 g in sodium chloride 0.9 % 100 mL IVPB  Status:  Discontinued     1 g 200 mL/hr over 30 Minutes Intravenous Every 24 hours 04/12/2018 1552 05/03/2018 2042     Subjective: Unresponsive, daughter at bedside  Objective: Vitals:   05/05/18 0538 05/07/18 0640 05/08/18 0611 05/12/18 0606  BP: 131/75 91/66 (!) 74/46 (!) 83/51  Pulse: 70 75 (!) 53 94  Resp: 20 19 (!) 28 20  Temp:   97.6 F (36.4 C)   TempSrc: Oral  Oral   SpO2: 100% 93% 92% (!) 64%    Intake/Output Summary (Last 24 hours) at 05/12/2018 1205 Last data filed at 05/11/2018 1700 Gross per 24 hour  Intake 0 ml  Output 100 ml  Net -100 ml   There were no vitals filed for this visit.  Examination:  Limited exam given comfort care Unresponsive, shallow respirations, appears  comfortable   Data Reviewed: I have personally reviewed following labs and imaging studies  CBC: No results for input(s): WBC, NEUTROABS, HGB, HCT, MCV, PLT in the last 168 hours. Basic Metabolic Panel: No results for input(s): NA, K, CL, CO2, GLUCOSE, BUN, CREATININE, CALCIUM, MG, PHOS in the last 168 hours. GFR: Estimated Creatinine Clearance: 10 mL/min (Corydon Schweiss) (by C-G formula based on SCr of 2.91 mg/dL (H)). Liver Function Tests: No results for input(s): AST, ALT, ALKPHOS, BILITOT, PROT, ALBUMIN in the last 168 hours. No results for input(s): LIPASE, AMYLASE in the last 168 hours. No results for input(s): AMMONIA in the last 168 hours. Coagulation Profile: No results for input(s): INR, PROTIME in the last 168 hours. Cardiac Enzymes: No results for input(s): CKTOTAL, CKMB, CKMBINDEX, TROPONINI in the last 168 hours. BNP (last 3 results) No results for input(s): PROBNP in the last 8760 hours. HbA1C: No results for input(s): HGBA1C in the last 72 hours. CBG: No results for input(s): GLUCAP in the last 168 hours. Lipid Profile: No results for input(s): CHOL, HDL, LDLCALC, TRIG, CHOLHDL, LDLDIRECT in the last 72 hours. Thyroid Function Tests: No results for input(s): TSH, T4TOTAL, FREET4, T3FREE, THYROIDAB in the last 72 hours. Anemia Panel: No results for input(s): VITAMINB12, FOLATE, FERRITIN, TIBC, IRON, RETICCTPCT in the last 72 hours. Sepsis Labs: No results for input(s): PROCALCITON, LATICACIDVEN in the last 168 hours.  No results found for this  or any previous visit (from the past 240 hour(s)).       Radiology Studies: No results found.      Scheduled Meds: . sodium chloride flush  3 mL Intravenous Q12H   Continuous Infusions: . chlorproMAZINE (THORAZINE) IV       LOS: 10 days    Time spent: over 30 min    Fayrene Helper, MD [facility] Pager AMION  If 7PM-7AM, please contact night-coverage [email or web address] Password [facility] 05/12/2018, 12:05 PM

## 2018-05-12 NOTE — Progress Notes (Addendum)
PMT progress note  Patient is actively dying, unresponsive, appears comfortable.  Daughter holding vigil at bedside. States he has been resting comfortably today.  Discussed again stress of unknown when someone close is dying.    Medication history reviewed.  BP (!) 83/51 (BP Location: Right Arm)   Pulse 94   Temp 97.6 F (36.4 C) (Oral)   Resp 20   SpO2 (!) 64%  Chart reviewed, labs and imaging noted.   shallow respirations. Periods of apnea noted + coolness no mottling S 1 S2 Unresponsive  PPS 10%  85 y.o.malewith hypothermia, sepsis, deemed secondary to UTI,acute hypoxic respiratory failure secondary to pulmonary edema,hyperkalemia, hypoglycemia, metabolic acidosis,andAKI. Actively dying and plan for full comfort care.  Plan: Continue current mode of care.  Anticipate hospital death Prognosis hours to some very limited number of days.  I do not think that he appears stable enough at this point to transition from the hospital. Discussed about transfer to [facility], daughter wishes to remain here at the hospital.   25 minutes spent.  Greater than 50%  of this time was spent counseling and coordinating care related to the above assessment and plan.  Loistine Chance, MD [place] Team [phone]

## 2018-05-13 NOTE — Progress Notes (Signed)
PMT progress note  Patient seen briefly, appears comfortable, daughter on phone, now in a different room.  Medication history reviewed.  BP (!) 83/51 (BP Location: Right Arm)   Pulse 94   Temp 97.6 F (36.4 C) (Oral)   Resp 20   SpO2 (!) 64%  Chart reviewed, labs and imaging noted.   shallow respirations. Periods of apnea noted + coolness no mottling S 1 S2 Unresponsive  PPS 10%  85 y.o.malewith hypothermia, sepsis, deemed secondary to UTI,acute hypoxic respiratory failure secondary to pulmonary edema,hyperkalemia, hypoglycemia, metabolic acidosis,andAKI. Actively dying and plan for full comfort care.  Plan: Continue current mode of care.  Anticipate hospital death Prognosis hours to some very limited number of days.  I do not think that he appears stable enough at this point to transition from the hospital. Discussed about transfer to [facility] on 05-12-2018, daughter wishes to remain here at the hospital.   15 minutes spent.  Greater than 50%  of this time was spent counseling and coordinating care related to the above assessment and plan.  Loistine Chance, MD [place] Team [phone]

## 2018-05-13 NOTE — Progress Notes (Signed)
PROGRESS NOTE    Justin [name]  ONG:[id number] DOB: 11/28/1913 DOA: 05/10/2018 PCP: Carol Ada, MD   Brief Narrative:  HPI per Dr. Danise Mina [name] a [phone] y.o.malewith medical history significant ofhypertension, bradycardia status post pacemaker, atrial fibrillation, chronic diastolic congestive heart failure presenting to the hospital via EMS for evaluation of altered mental status. No history could be obtained from the patient. Daughter states patient has been declining for the past 3 days. He has had multiple falls. One fall last week and 2 falls this week. States he was taken to the emergency room earlier this week and they were told he did not have any fractures. He was also diagnosed with a UTI recently and placed on Bactrim. Daughter states patient is very weak and has not been sleeping. Daughter denies any fevers, nausea, vomiting, or diarrhea. States patient has not complained of any pain.  ED Course:EMS noted patient was cool to touch with poor perfusion. Hypothermic with temperature 96.4 F on arrival andplaced [employer]. Hypotensive. Intermittently tachycardic and tachypneic. SPO2 84% on room air. CBG 62. White count 12.2. Lactic acid 2.6 >2.9 after 1 L fluid bolus. Patient continued to be hypotensive. INR 1.9. Potassium 6.3. Bicarb 16. BUN 73. Creatinine 2.9, baseline 1.4-1.5. UA with large amount of leukocytes, greater than 50 WBCs, and rare bacteria. Influenza panel negative. Respiratory viral panel negative. Head CT negative for acute finding. Chest x-ray with findings consistent with congestive heart failure including bilateral pulmonary edema and small bilateral pleural effusions. Patient received 1 amp D50, sodium bicarb 50 mEq, Ativan 1 mg total, and ceftriaxone.  ED provider had a goals of care discussion with the patient's daughter.Daughter wanted the patient to be transition to full comfort care.   Assessment & Plan:     Principal Problem:   Severe sepsis ([place]) Active Problems:   Hypothermia   UTI (urinary tract infection)   Acute respiratory failure with hypoxia (HCC)   Pulmonary edema   Hyperkalemia   Hypoglycemia   Metabolic acidosis   AKI (acute kidney injury) ([place])   Palliative care by specialist   Goals of care, counseling/discussion   Generalized pain   Altered mental status   Acute on chronic diastolic CHF (congestive heart failure) ([place])   1 severe sepsis Patient presented with severe sepsis with hypothermia, leukocytosis, hypotension, acute renal failure, hyperkalemia, elevated lactic acid level with urinalysis worrisome for UTI.  CT head done was negative.  Chest x-ray negative for any acute infiltrates however consistent with CHF exacerbation.  Admitting physician and ED provider discussed goals of care discussion with patient's daughter and healthcare power of attorney, [name], who has decided on full comfort measures.  End-of-life order set placed.  Patient minimally responsive.  Patient seen in consultation by palliative care.  Full comfort measures.  Patient placed on IV dilaudid for severe pain and dyspnea, Ativan as needed for anxiety, atropine and Robinul for secretions.  Supplemental oxygen for comfort.  Palliative care following and appreciate their input and recommendations.    daughter   at bedside , continuing comfort measures.  Pt actively dying, expect hospital death, possibly within hours.   RN has been instructed to turn off [employer] pacemaker  2.  Acute renal failure/hyperkalemia Likely secondary to prerenal azotemia.  Patient also noted to be hypotensive on admission.  Patient with concerns for UTI.  Family has decided on full comfort measures.  3.  Metabolic acidosis Secondary to problem #2.  4.  Acute on chronic diastolic  heart failure Patient noted to be hypotensive on admission and as such posing difficulty with diuresis.  Family has decided on full comfort  measures.  5.  Acute encephalopathy Secondary to problems #1 2 and 3 and 4.  Patient currently full comfort measures.  Patient minimally responsive.  Patient actively dying.  6.  Prognosis Patient is 85 year old gentleman history of hypertension, bradycardia status post PPM, A. fib, chronic diastolic failure presented to the hospital with acute encephalopathy noted to be in severe sepsis.  Patient also noted to have a UTI, hyperkalemia, hypothermia, acute renal failure, acute on chronic diastolic heart failure, metabolic acidosis.  Patient minimally responsive.  Patient actively dying.  Family have decided on full comfort measures.  Palliative care following .  Patient likely in hospital death and prognosis is hours to limited number of days.  Palliative care following and appreciate input and recommendations.   DVT prophylaxis: comfort care Code Status: DNR Family Communication: daughter at bedside Disposition Plan: expect in hospital death   Consultants:   palliative  Procedures:  none  Antimicrobials:  Anti-infectives (From admission, onward)   Start     Dose/Rate Route Frequency Ordered Stop   04/13/2018 1600  cefTRIAXone (ROCEPHIN) 1 g in sodium chloride 0.9 % 100 mL IVPB  Status:  Discontinued     1 g 200 mL/hr over 30 Minutes Intravenous Every 24 hours 04/14/2018 1552 05/12/2018 2042     Subjective: Unresponsive, daughter at bedside, requests that his pacemaker which was placed in February 2020 should be turned off  Objective: Vitals:   05/05/18 0538 05/07/18 0640 05/08/18 0611 05/12/18 0606  BP: 131/75 91/66 (!) 74/46 (!) 83/51  Pulse: 70 75 (!) 53 94  Resp: 20 19 (!) 28 20  Temp:   97.6 F (36.4 C)   TempSrc: Oral  Oral   SpO2: 100% 93% 92% (!) 64%   No intake or output data in the 24 hours ending 05/13/18 0831 There were no vitals filed for this visit.  Examination:  Limited exam given comfort care Unresponsive, shallow respirations, appears  comfortable   Data Reviewed: I have personally reviewed following labs and imaging studies  CBC: No results for input(s): WBC, NEUTROABS, HGB, HCT, MCV, PLT in the last 168 hours. Basic Metabolic Panel: No results for input(s): NA, K, CL, CO2, GLUCOSE, BUN, CREATININE, CALCIUM, MG, PHOS in the last 168 hours. GFR: CrCl cannot be calculated (Unknown ideal weight.). Liver Function Tests: No results for input(s): AST, ALT, ALKPHOS, BILITOT, PROT, ALBUMIN in the last 168 hours. No results for input(s): LIPASE, AMYLASE in the last 168 hours. No results for input(s): AMMONIA in the last 168 hours. Coagulation Profile: No results for input(s): INR, PROTIME in the last 168 hours. Cardiac Enzymes: No results for input(s): CKTOTAL, CKMB, CKMBINDEX, TROPONINI in the last 168 hours. BNP (last 3 results) No results for input(s): PROBNP in the last 8760 hours. HbA1C: No results for input(s): HGBA1C in the last 72 hours. CBG: No results for input(s): GLUCAP in the last 168 hours. Lipid Profile: No results for input(s): CHOL, HDL, LDLCALC, TRIG, CHOLHDL, LDLDIRECT in the last 72 hours. Thyroid Function Tests: No results for input(s): TSH, T4TOTAL, FREET4, T3FREE, THYROIDAB in the last 72 hours. Anemia Panel: No results for input(s): VITAMINB12, FOLATE, FERRITIN, TIBC, IRON, RETICCTPCT in the last 72 hours. Sepsis Labs: No results for input(s): PROCALCITON, LATICACIDVEN in the last 168 hours.  No results found for this or any previous visit (from the past 240 hour(s)).  Radiology Studies: No results found.      Scheduled Meds:  hydrALAZINE  10 mg Intravenous Once   sodium chloride flush  3 mL Intravenous Q12H   Continuous Infusions:  chlorproMAZINE (THORAZINE) IV       LOS: 11 days    Time spent: over 30 min    Reyne Dumas, MD [facility] Pager AMION  If 7PM-7AM, please contact night-coverage [email or web address] Password [facility] 05/13/2018, 8:31 AM

## 2018-05-13 DEATH — deceased

## 2018-05-14 MED ORDER — MORPHINE 100MG IN NS 100ML (1MG/ML) PREMIX INFUSION
1.0000 mg/h | INTRAVENOUS | Status: DC
  Filled 2018-05-14: qty 100

## 2018-06-02 ENCOUNTER — Telehealth: Payer: Self-pay | Admitting: Urology

## 2018-06-02 NOTE — Telephone Encounter (Signed)
Copied from CRM [id number]. Topic: Return Call - Speak to Provider/Office Staff  >> Jun 02, 2018  4:16 PM Mariel Kansky wrote:  [name] granddaughter of Jonathan Cisneros called to state he has passed away.

## 2018-06-12 NOTE — Discharge Summary (Signed)
Physician Death Summary  ARVIL UTZ MRN: [id number] DOB/AGE: 1913-03-02 85 y.o.  PCP: Carol Ada, MD   Admit date: 04/14/2018 Discharge date: 2018-05-27  Discharge Diagnoses:    Principal Problem:   Severe sepsis [facility]) Active Problems:   Hypothermia   UTI (urinary tract infection)   Acute respiratory failure with hypoxia (HCC)   Pulmonary edema   Hyperkalemia   Hypoglycemia   Metabolic acidosis   AKI (acute kidney injury) ([place])   Palliative care by specialist   Goals of care, counseling/discussion   Generalized pain   Altered mental status   Acute on chronic diastolic CHF (congestive heart failure) (HCC)   Time of death 6 AM                 Allergies  Allergen Reactions  . Penicillins Rash    Did it involve swelling of the face/tongue/throat, SOB, or low BP? No Did it involve sudden or severe rash/hives, skin peeling, or any reaction on the inside of your mouth or nose? No Did you need to seek medical attention at a hospital or doctor's office? No When did it last happen?40 years ago If all above answers are "NO", may proceed with cephalosporin use.      Disposition: deceased   Consults:  Palliative care    Significant Diagnostic Studies:  Dg Chest 2 View  Result Date: 04/29/2018 CLINICAL DATA:  Shortness of breath. EXAM: CHEST - 2 VIEW COMPARISON:  04/03/2018 FINDINGS: Chronic cardiopericardial enlargement. There is a biventricular pacer from the left with stable appearance. Trace pleural effusions that were also seen previously. Hyperinflation with diaphragm flattening. Vague hazy density at the left apex is bony in stable over multiple years. IMPRESSION: No acute finding when compared to priors. Electronically Signed   By: Monte Fantasia M.D.   On: 04/29/2018 09:01   Ct Head Wo Contrast  Result Date: 05/04/2018 CLINICAL DATA:  Altered mental status.  Recent UTI diagnosis. EXAM: CT HEAD WITHOUT CONTRAST TECHNIQUE: Contiguous axial  images were obtained from the base of the skull through the vertex without intravenous contrast. COMPARISON:  CT head dated April 29, 2018. FINDINGS: Brain: No evidence of acute infarction, hemorrhage, hydrocephalus, extra-axial collection or mass lesion/mass effect. Stable atrophy and chronic microvascular ischemic changes. Vascular: Calcified atherosclerosis at the skullbase. No hyperdense vessel. Skull: Negative for fracture or focal lesion. Sinuses/Orbits: No acute finding. Other: None. IMPRESSION: 1.  No acute intracranial abnormality.  No interval change. Electronically Signed   By: Titus Dubin M.D.   On: 05/08/2018 15:48   Ct Head Wo Contrast  Result Date: 04/29/2018 CLINICAL DATA:  Hallucinating, AMS, fall EXAM: CT HEAD WITHOUT CONTRAST CT CERVICAL SPINE WITHOUT CONTRAST TECHNIQUE: Multidetector CT imaging of the head and cervical spine was performed following the standard protocol without intravenous contrast. Multiplanar CT image reconstructions of the cervical spine were also generated. COMPARISON:  None. FINDINGS: CT HEAD FINDINGS Brain: No evidence of acute infarction, hemorrhage, hydrocephalus, extra-axial collection or mass lesion/mass effect. Extensive periventricular and deep white matter hypodensity. Mild global volume loss. Vascular: No hyperdense vessel or unexpected calcification. Skull: Normal. Negative for fracture or focal lesion. Sinuses/Orbits: No acute finding. Other: None. CT CERVICAL SPINE FINDINGS Alignment: Degenerative straightening of the normal cervical lordosis. Skull base and vertebrae: No acute fracture. No primary bone lesion or focal pathologic process. Soft tissues and spinal canal: No prevertebral fluid or swelling. No visible canal hematoma. Disc levels: Moderate to severe multilevel disc degenerative disease and osteophytosis. Upper chest: Negative.  Other: None. IMPRESSION: 1. No acute intracranial pathology. Small-vessel white matter disease and mild global volume  loss in keeping with advanced patient age. 2. No fracture or static subluxation of the cervical spine. Moderate to severe multilevel disc degenerative disease. Electronically Signed   By: Eddie Candle M.D.   On: 04/29/2018 09:28   Ct Cervical Spine Wo Contrast  Result Date: 04/29/2018 CLINICAL DATA:  Hallucinating, AMS, fall EXAM: CT HEAD WITHOUT CONTRAST CT CERVICAL SPINE WITHOUT CONTRAST TECHNIQUE: Multidetector CT imaging of the head and cervical spine was performed following the standard protocol without intravenous contrast. Multiplanar CT image reconstructions of the cervical spine were also generated. COMPARISON:  None. FINDINGS: CT HEAD FINDINGS Brain: No evidence of acute infarction, hemorrhage, hydrocephalus, extra-axial collection or mass lesion/mass effect. Extensive periventricular and deep white matter hypodensity. Mild global volume loss. Vascular: No hyperdense vessel or unexpected calcification. Skull: Normal. Negative for fracture or focal lesion. Sinuses/Orbits: No acute finding. Other: None. CT CERVICAL SPINE FINDINGS Alignment: Degenerative straightening of the normal cervical lordosis. Skull base and vertebrae: No acute fracture. No primary bone lesion or focal pathologic process. Soft tissues and spinal canal: No prevertebral fluid or swelling. No visible canal hematoma. Disc levels: Moderate to severe multilevel disc degenerative disease and osteophytosis. Upper chest: Negative. Other: None. IMPRESSION: 1. No acute intracranial pathology. Small-vessel white matter disease and mild global volume loss in keeping with advanced patient age. 2. No fracture or static subluxation of the cervical spine. Moderate to severe multilevel disc degenerative disease. Electronically Signed   By: Eddie Candle M.D.   On: 04/29/2018 09:28   Dg Chest Port 1 View  Result Date: 05/05/2018 CLINICAL DATA:  Loss of consciousness. EXAM: PORTABLE CHEST 1 VIEW COMPARISON:  Chest x-ray 04/29/2018. FINDINGS: AICD  in stable position. Prior CABG. Cardiomegaly with diffuse bilateral pulmonary infiltrates consistent bilateral pulmonary edema. Bilateral pneumonia can not be excluded. Small bilateral pleural effusions. No pneumothorax. IMPRESSION: AICD in stable position. Prior CABG. Findings consistent with congestive heart failure bilateral pulmonary edema and small bilateral pleural effusions. Electronically Signed   By: Marcello Moores  Register   On: 04/17/2018 14:57       Filed Weights   2018-05-23 0600  Weight: 60 kg     Microbiology: No results found for this or any previous visit (from the past 240 hour(s)).     Blood Culture    Component Value Date/Time   SDES  04/17/2018 1727    BLOOD LEFT FOREARM Performed at [facility], [place] [address], [place], [place] [address]    SPECREQUEST  04/29/2018 1727    BOTTLES DRAWN AEROBIC AND ANAEROBIC Blood Culture results may not be optimal due to an inadequate volume of blood received in culture bottles Performed at [facility], [place] [address], [place], [place] [address]    CULT  05/02/2018 1727    NO GROWTH 5 DAYS Performed at [place] [address] [place], [place] [address]    REPTSTATUS 05/06/2018 FINAL 05/06/2018 1727      Labs: No results found for this or any previous visit (from the past 48 hour(s)).   Lipid Panel  No results found for: CHOL, TRIG, HDL, CHOLHDL, VLDL, LDLCALC, LDLDIRECT   No results found for: HGBA1C   Lab Results  Component Value Date   CREATININE 2.91 (H) 04/30/2018     HPI :  Justin [name] Cushis a 85 y.o.malewith medical history significant ofhypertension, bradycardia status post pacemaker, atrial fibrillation, chronic diastolic  congestive heart failure presenting to the hospital via EMS for evaluation of altered mental status. No history could be obtained from the patient. Daughter states patient has been declining for the past 3 days. He has had multiple  falls. One fall last week and 2 falls this week. States he was taken to the emergency room earlier this week and they were told he did not have any fractures. He was also diagnosed with a UTI recently and placed on Bactrim. Daughter states patient is very weak and has not been sleeping. Daughter denies any fevers, nausea, vomiting, or diarrhea. States patient has not complained of any pain.  ED Course:EMS noted patient was cool to touch with poor perfusion. Hypothermic with temperature 96.4 F on arrival andplaced [employer]. Hypotensive. Intermittently tachycardic and tachypneic. SPO2 84% on room air. CBG 62. White count 12.2. Lactic acid 2.6 >2.9 after 1 L fluid bolus. Patient continued to be hypotensive. INR 1.9. Potassium 6.3. Bicarb 16. BUN 73. Creatinine 2.9, baseline 1.4-1.5. UA with large amount of leukocytes, greater than 50 WBCs, and rare bacteria. Influenza panel negative. Respiratory viral panel negative. Head CT negative for acute finding. Chest x-ray with findings consistent with congestive heart failure including bilateral pulmonary edema and small bilateral pleural effusions. Patient received 1 amp D50, sodium bicarb 50 mEq, Ativan 1 mg total, and ceftriaxone.  ED provider had a goals of care discussion with the patient's daughter.Daughter wanted the patient to be transition to full comfort care.  HOSPITAL COURSE:   Patient seen by palliative care attending, with the consent of the family placed on comfort care measures, patient died on 2022/06/05 at 6 AM   Discharge Exam:   Blood pressure (!) 83/51, pulse 94, temperature 97.6 F (36.4 C), temperature source Oral, resp. rate 20, height 5\' 3"  (1.6 m), weight 60 kg, SpO2 (!) 64 %.        SignedReyne Dumas 05-Jun-2018, 10:19 AM      Time needed to  prepare  discharge, discussed with the patient and family 35 minutes

## 2018-06-12 DEATH — deceased

## 2018-06-17 ENCOUNTER — Encounter: Payer: Medicare Other | Admitting: Internal Medicine

## 2018-07-07 ENCOUNTER — Encounter: Payer: Medicare Other | Admitting: Internal Medicine

## 2019-07-23 IMAGING — DX DG CHEST 2V
2 series · 2 of 2 positions shown · non-contrast
Comparison: 03/30/2018.

CLINICAL DATA: Pacemaker placement.

EXAM:
CHEST - 2 VIEW

[chest lat]
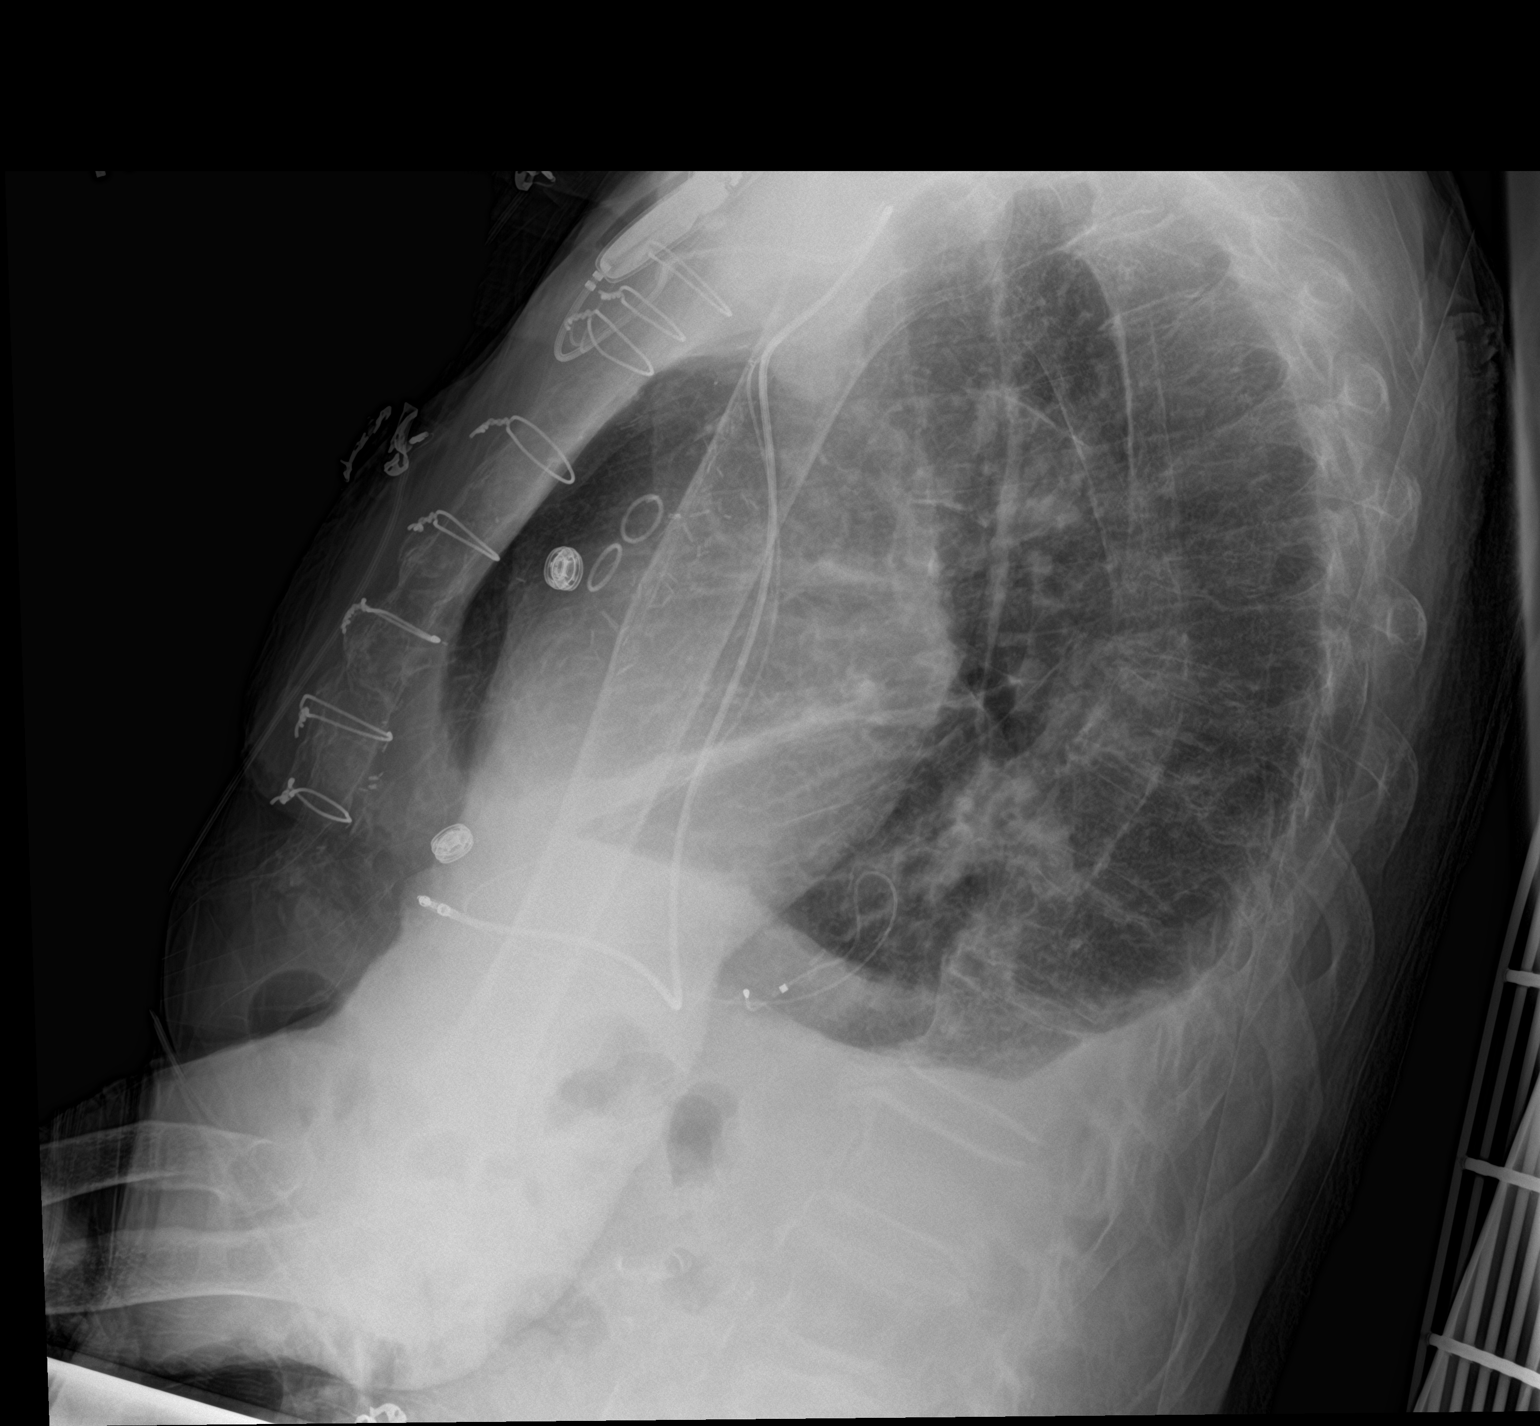

[chest ap]
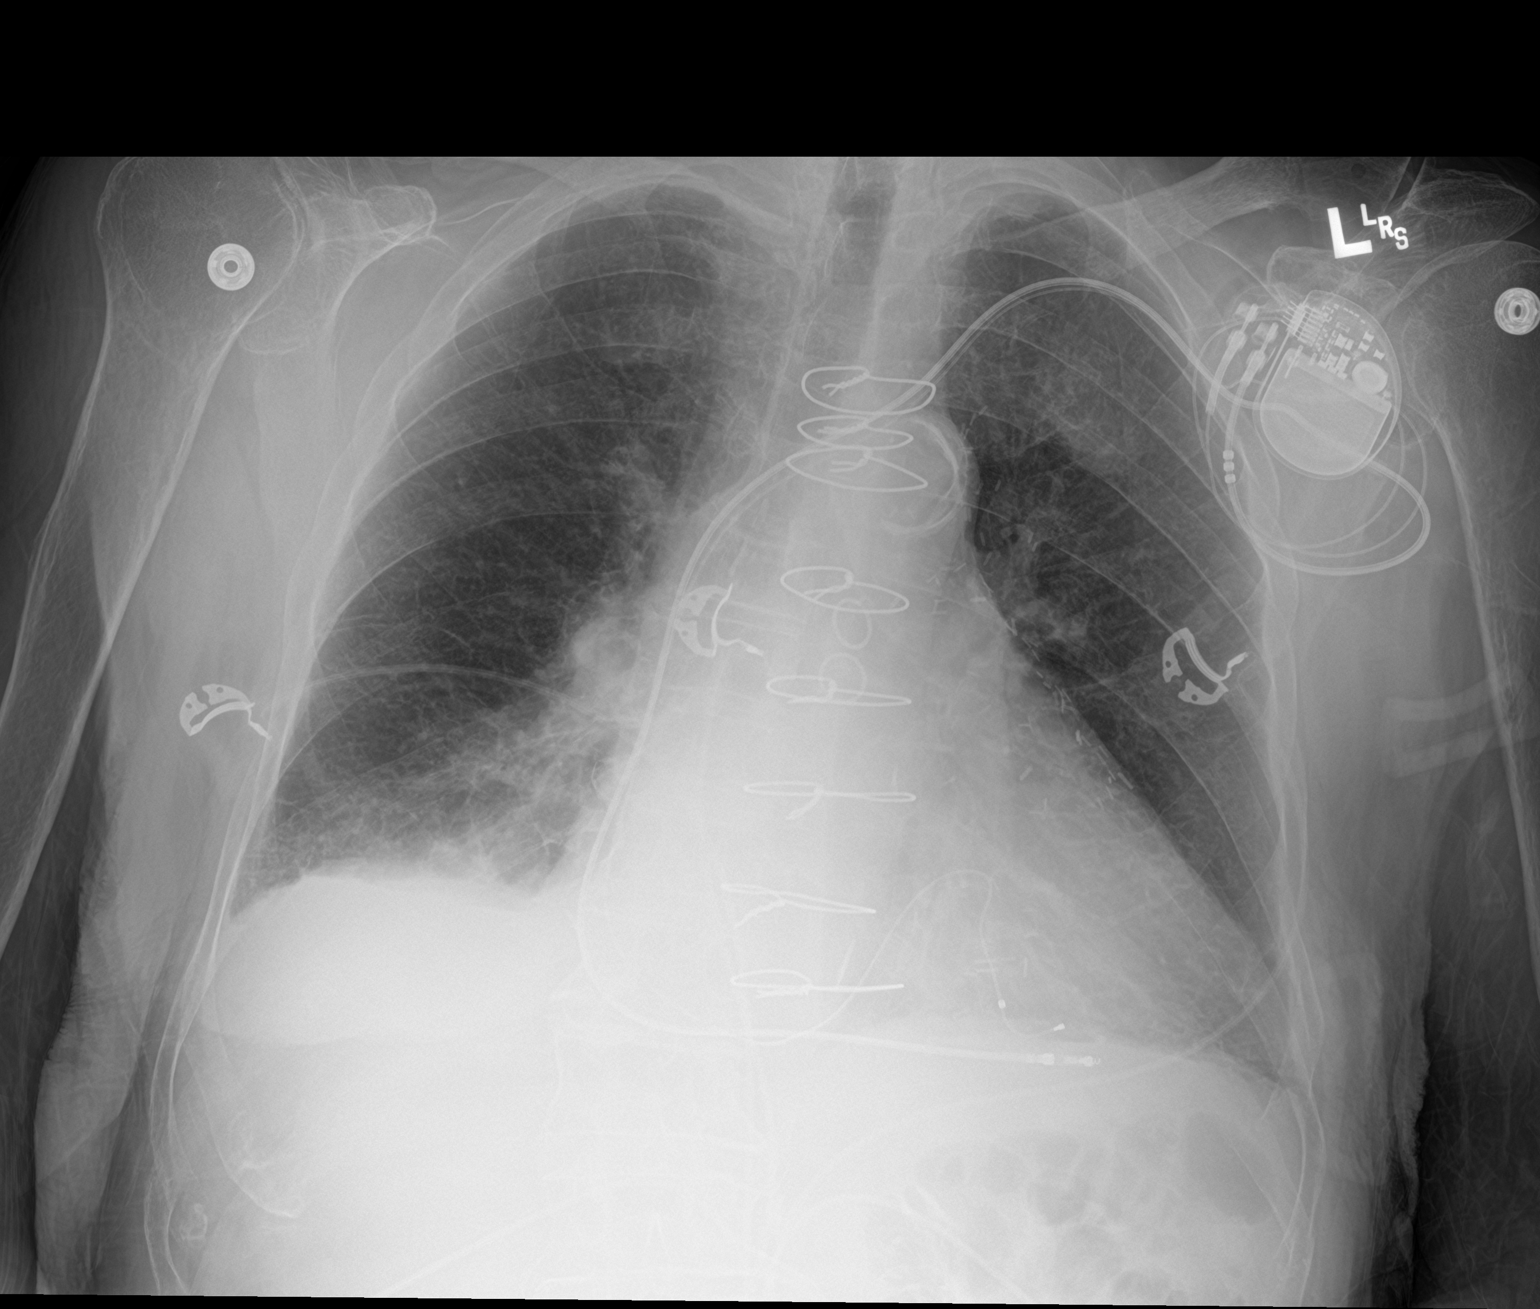

[2 of 2 positions shown; findings below may reference images not displayed]

FINDINGS: AICD noted with lead tips projected the lower mid portion of the
heart. Prior CABG. Cardiomegaly. No pulmonary venous congestion.
Persistent right base infiltrate. Tiny right pleural effusion. No
pneumothorax.
IMPRESSION: 1. AICD. Prior CABG. Cardiomegaly. No pulmonary venous congestion.

2. Persistent right base infiltrate. Right base pneumonia can not be
excluded. Tiny right pleural effusion. No pneumothorax.

## 2019-08-11 ENCOUNTER — Other Ambulatory Visit: Payer: Self-pay | Admitting: Internal Medicine

## 2019-08-18 IMAGING — DX CHEST - 2 VIEW
2 series · 2 of 2 positions shown · non-contrast
Comparison: 04/03/2018

CLINICAL DATA: Shortness of breath.

EXAM:
CHEST - 2 VIEW

[chest lat]
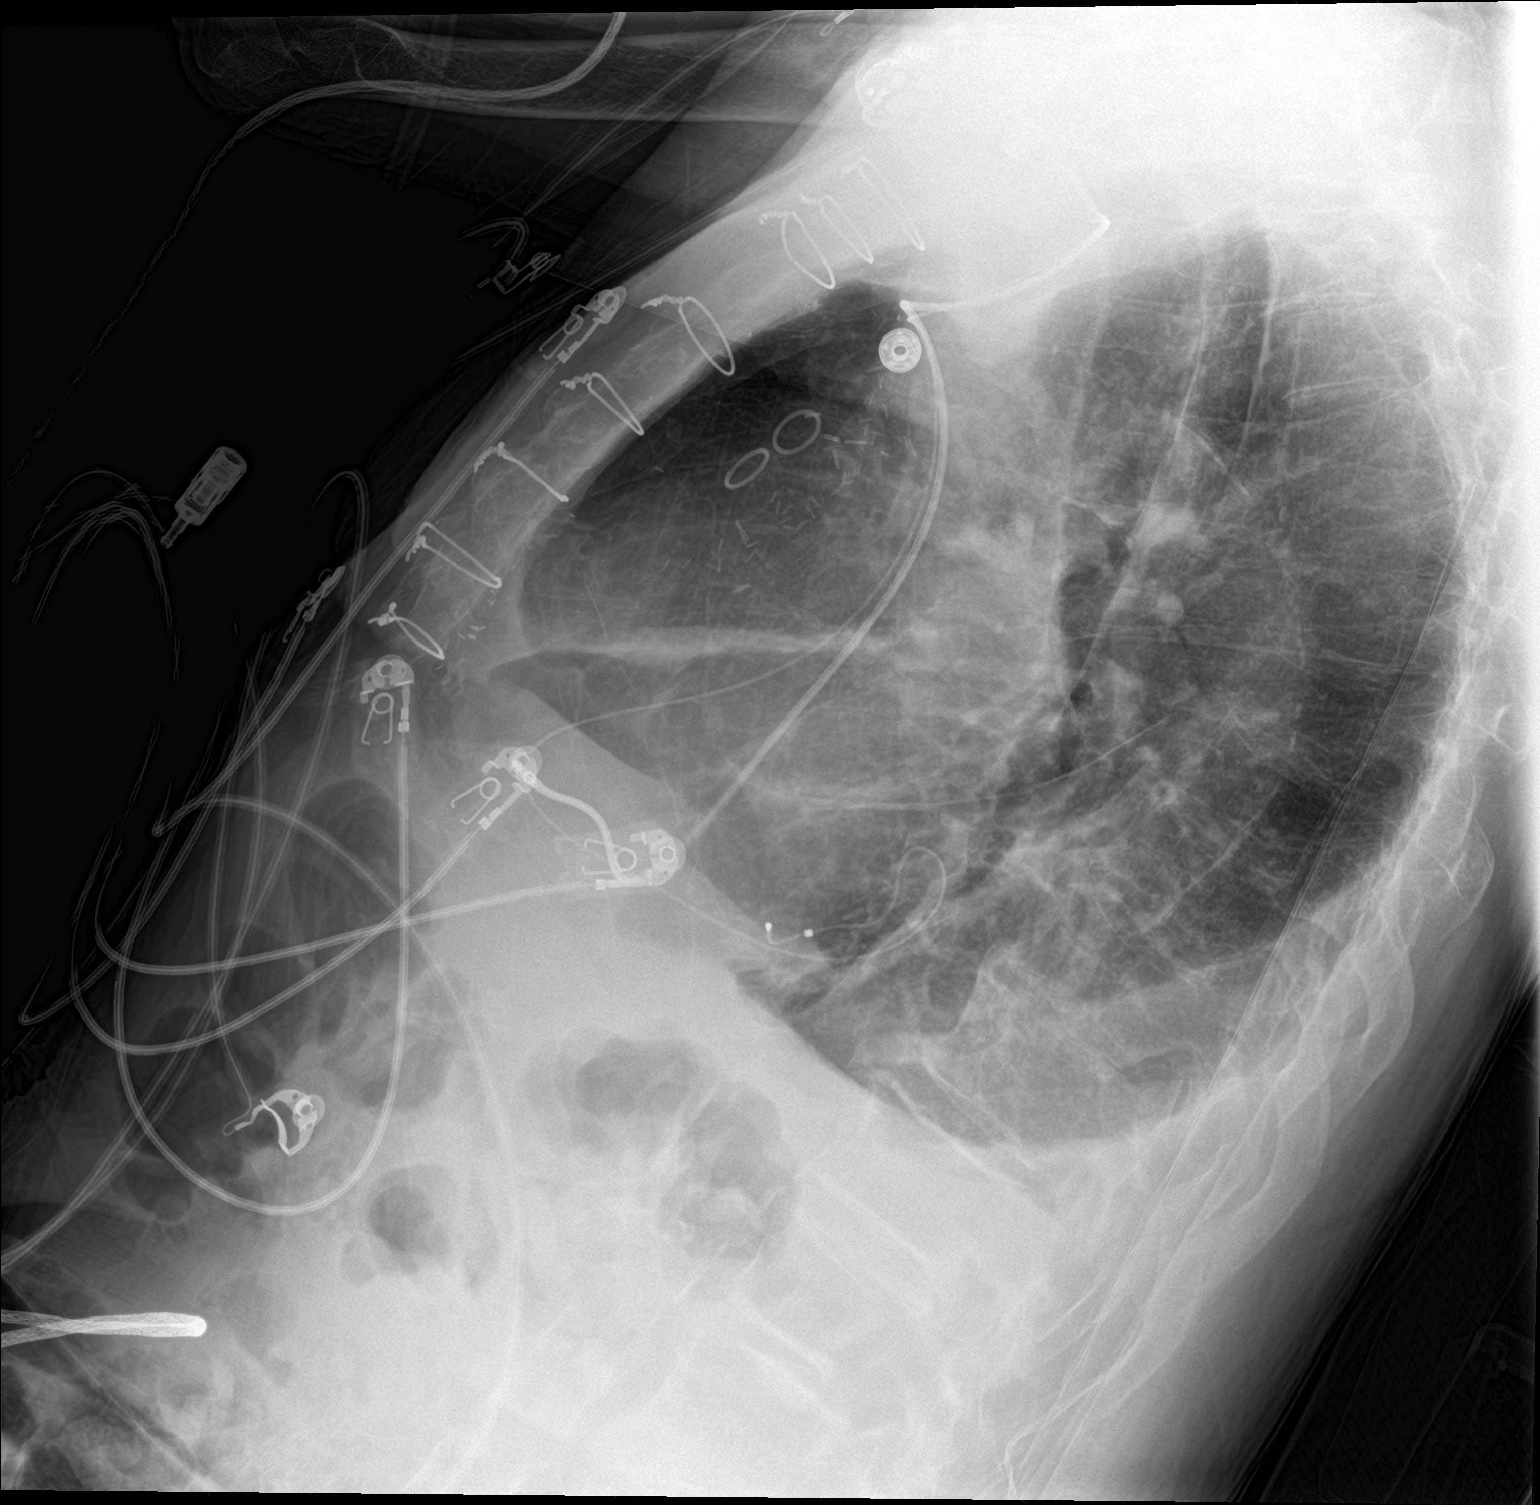

[chest ap]
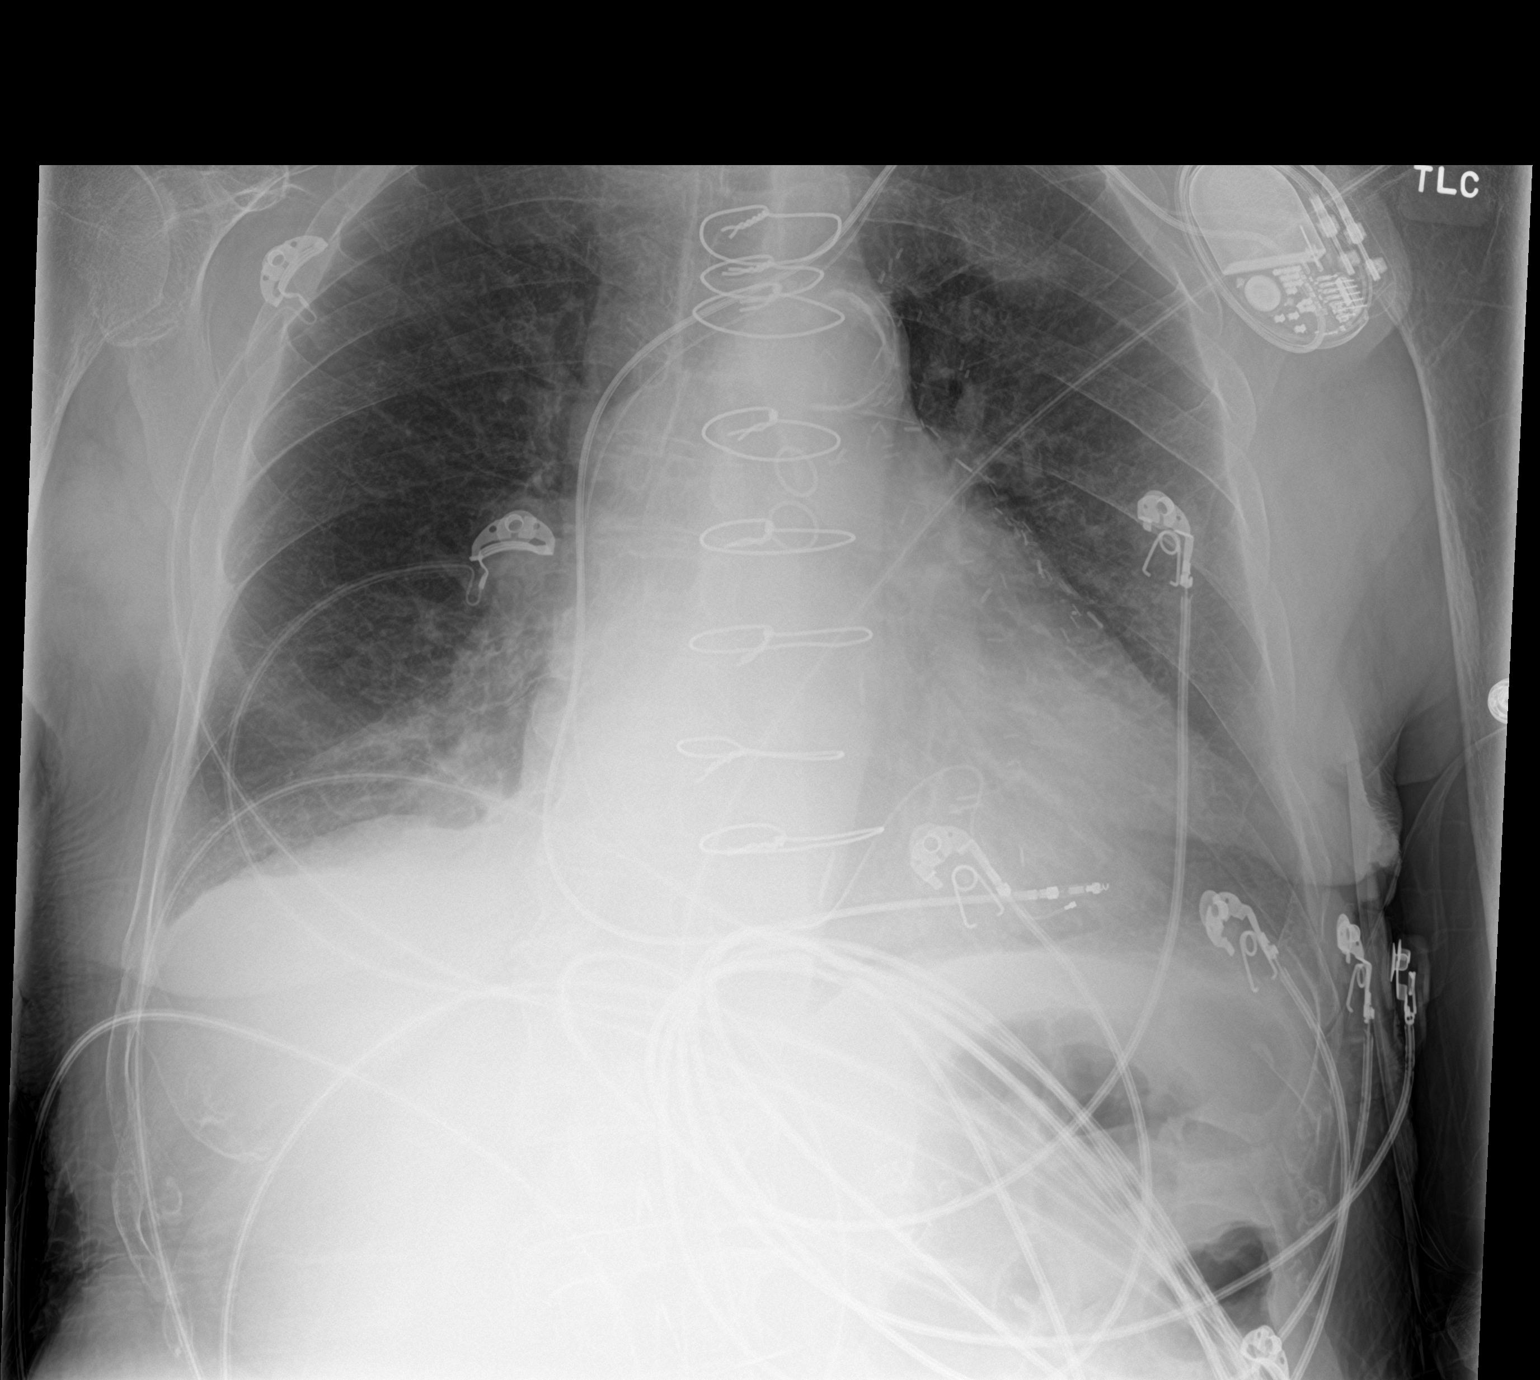

[2 of 2 positions shown; findings below may reference images not displayed]

FINDINGS: Chronic cardiopericardial enlargement. There is a biventricular
pacer from the left with stable appearance. Trace pleural effusions
that were also seen previously. Hyperinflation with diaphragm
flattening. Vague hazy density at the left apex is bony in stable
over multiple years.
IMPRESSION: No acute finding when compared to priors.
